# Patient Record
Sex: Female | Born: 1945 | Race: Black or African American | Hispanic: No | Marital: Married | Smoking: Former smoker
Health system: Southern US, Community
[De-identification: ages and names within clinical notes are randomized; demographics above are authoritative.]

## PROBLEM LIST (undated history)

## (undated) DIAGNOSIS — L709 Acne, unspecified: Secondary | ICD-10-CM

## (undated) DIAGNOSIS — T7840XA Allergy, unspecified, initial encounter: Secondary | ICD-10-CM

## (undated) DIAGNOSIS — M1990 Unspecified osteoarthritis, unspecified site: Secondary | ICD-10-CM

## (undated) DIAGNOSIS — Z9289 Personal history of other medical treatment: Secondary | ICD-10-CM

## (undated) DIAGNOSIS — C01 Malignant neoplasm of base of tongue: Secondary | ICD-10-CM

## (undated) DIAGNOSIS — Z9221 Personal history of antineoplastic chemotherapy: Secondary | ICD-10-CM

## (undated) DIAGNOSIS — R21 Rash and other nonspecific skin eruption: Principal | ICD-10-CM

## (undated) DIAGNOSIS — C3490 Malignant neoplasm of unspecified part of unspecified bronchus or lung: Secondary | ICD-10-CM

## (undated) DIAGNOSIS — C139 Malignant neoplasm of hypopharynx, unspecified: Secondary | ICD-10-CM

## (undated) DIAGNOSIS — J329 Chronic sinusitis, unspecified: Secondary | ICD-10-CM

## (undated) DIAGNOSIS — E785 Hyperlipidemia, unspecified: Secondary | ICD-10-CM

## (undated) DIAGNOSIS — K219 Gastro-esophageal reflux disease without esophagitis: Secondary | ICD-10-CM

## (undated) DIAGNOSIS — K1231 Oral mucositis (ulcerative) due to antineoplastic therapy: Principal | ICD-10-CM

## (undated) DIAGNOSIS — Z923 Personal history of irradiation: Secondary | ICD-10-CM

## (undated) DIAGNOSIS — M8580 Other specified disorders of bone density and structure, unspecified site: Secondary | ICD-10-CM

## (undated) DIAGNOSIS — C029 Malignant neoplasm of tongue, unspecified: Secondary | ICD-10-CM

## (undated) HISTORY — DX: Rash and other nonspecific skin eruption: R21

## (undated) HISTORY — DX: Acne, unspecified: L70.9

## (undated) HISTORY — DX: Personal history of antineoplastic chemotherapy: Z92.21

## (undated) HISTORY — DX: Malignant neoplasm of hypopharynx, unspecified: C13.9

## (undated) HISTORY — DX: Malignant neoplasm of base of tongue: C01

## (undated) HISTORY — DX: Allergy, unspecified, initial encounter: T78.40XA

## (undated) HISTORY — PX: DIAGNOSTIC LARYNGOSCOPY: SHX5368

## (undated) HISTORY — DX: Chronic sinusitis, unspecified: J32.9

## (undated) HISTORY — DX: Personal history of irradiation: Z92.3

## (undated) HISTORY — PX: REFRACTIVE SURGERY: SHX103

## (undated) HISTORY — PX: TUBAL LIGATION: SHX77

## (undated) HISTORY — DX: Oral mucositis (ulcerative) due to antineoplastic therapy: K12.31

## (undated) HISTORY — DX: Hyperlipidemia, unspecified: E78.5

## (undated) HISTORY — DX: Other specified disorders of bone density and structure, unspecified site: M85.80

---

## 1998-08-05 ENCOUNTER — Other Ambulatory Visit: Admission: RE | Admit: 1998-08-05 | Discharge: 1998-08-05 | Payer: Self-pay | Admitting: Obstetrics & Gynecology

## 1999-06-09 ENCOUNTER — Encounter: Payer: Self-pay | Admitting: *Deleted

## 1999-06-09 ENCOUNTER — Ambulatory Visit (HOSPITAL_COMMUNITY): Admission: RE | Admit: 1999-06-09 | Discharge: 1999-06-09 | Payer: Self-pay | Admitting: *Deleted

## 1999-10-16 ENCOUNTER — Other Ambulatory Visit: Admission: RE | Admit: 1999-10-16 | Discharge: 1999-10-16 | Payer: Self-pay | Admitting: Obstetrics & Gynecology

## 2001-01-09 ENCOUNTER — Other Ambulatory Visit: Admission: RE | Admit: 2001-01-09 | Discharge: 2001-01-09 | Payer: Self-pay | Admitting: Obstetrics & Gynecology

## 2001-10-13 ENCOUNTER — Ambulatory Visit (HOSPITAL_COMMUNITY): Admission: RE | Admit: 2001-10-13 | Discharge: 2001-10-13 | Payer: Self-pay | Admitting: Family Medicine

## 2001-10-13 ENCOUNTER — Encounter: Payer: Self-pay | Admitting: Family Medicine

## 2002-03-12 ENCOUNTER — Other Ambulatory Visit: Admission: RE | Admit: 2002-03-12 | Discharge: 2002-03-12 | Payer: Self-pay | Admitting: Obstetrics & Gynecology

## 2002-11-12 ENCOUNTER — Ambulatory Visit (HOSPITAL_COMMUNITY): Admission: RE | Admit: 2002-11-12 | Discharge: 2002-11-12 | Payer: Self-pay | Admitting: *Deleted

## 2002-11-12 ENCOUNTER — Encounter: Payer: Self-pay | Admitting: *Deleted

## 2003-06-10 ENCOUNTER — Other Ambulatory Visit: Admission: RE | Admit: 2003-06-10 | Discharge: 2003-06-10 | Payer: Self-pay | Admitting: Obstetrics & Gynecology

## 2004-08-05 ENCOUNTER — Other Ambulatory Visit: Admission: RE | Admit: 2004-08-05 | Discharge: 2004-08-05 | Payer: Self-pay | Admitting: Obstetrics & Gynecology

## 2005-11-17 ENCOUNTER — Other Ambulatory Visit: Admission: RE | Admit: 2005-11-17 | Discharge: 2005-11-17 | Payer: Self-pay | Admitting: Obstetrics & Gynecology

## 2009-07-11 ENCOUNTER — Encounter: Admission: RE | Admit: 2009-07-11 | Discharge: 2009-07-11 | Payer: Self-pay | Admitting: Family Medicine

## 2011-07-02 ENCOUNTER — Other Ambulatory Visit: Payer: Self-pay | Admitting: Otolaryngology

## 2011-07-02 DIAGNOSIS — R221 Localized swelling, mass and lump, neck: Secondary | ICD-10-CM

## 2011-07-05 ENCOUNTER — Ambulatory Visit
Admission: RE | Admit: 2011-07-05 | Discharge: 2011-07-05 | Disposition: A | Discharge status: Home / Self Care | Payer: BC Managed Care – PPO | Source: Ambulatory Visit | Attending: Otolaryngology | Admitting: Otolaryngology

## 2011-07-05 DIAGNOSIS — R221 Localized swelling, mass and lump, neck: Secondary | ICD-10-CM

## 2011-07-05 MED ORDER — IOHEXOL 300 MG/ML  SOLN: 75.0000 mL | Freq: Once | INTRAMUSCULAR | Status: AC | PRN

## 2011-07-21 ENCOUNTER — Other Ambulatory Visit: Payer: Self-pay | Admitting: Otolaryngology

## 2011-07-21 ENCOUNTER — Other Ambulatory Visit (HOSPITAL_COMMUNITY)
Admission: RE | Admit: 2011-07-21 | Discharge: 2011-07-21 | Disposition: A | Discharge status: Home / Self Care | Payer: BC Managed Care – PPO | Source: Ambulatory Visit | Attending: Otolaryngology | Admitting: Otolaryngology

## 2011-07-21 DIAGNOSIS — E049 Nontoxic goiter, unspecified: Secondary | ICD-10-CM | POA: Insufficient documentation

## 2011-08-12 ENCOUNTER — Other Ambulatory Visit: Payer: Self-pay | Admitting: Otolaryngology

## 2011-08-16 DIAGNOSIS — C01 Malignant neoplasm of base of tongue: Secondary | ICD-10-CM

## 2011-08-16 HISTORY — DX: Malignant neoplasm of base of tongue: C01

## 2011-08-26 ENCOUNTER — Other Ambulatory Visit (HOSPITAL_COMMUNITY): Payer: Self-pay | Admitting: Otolaryngology

## 2011-08-26 DIAGNOSIS — R221 Localized swelling, mass and lump, neck: Secondary | ICD-10-CM

## 2011-09-03 ENCOUNTER — Ambulatory Visit (HOSPITAL_COMMUNITY)
Admission: RE | Admit: 2011-09-03 | Discharge: 2011-09-03 | Disposition: A | Discharge status: Home / Self Care | Payer: BC Managed Care – PPO | Source: Ambulatory Visit | Attending: Otolaryngology | Admitting: Otolaryngology

## 2011-09-03 ENCOUNTER — Other Ambulatory Visit: Payer: Self-pay | Admitting: Interventional Radiology

## 2011-09-03 DIAGNOSIS — C760 Malignant neoplasm of head, face and neck: Secondary | ICD-10-CM | POA: Insufficient documentation

## 2011-09-03 DIAGNOSIS — R221 Localized swelling, mass and lump, neck: Secondary | ICD-10-CM

## 2011-09-03 DIAGNOSIS — C139 Malignant neoplasm of hypopharynx, unspecified: Secondary | ICD-10-CM

## 2011-09-03 HISTORY — DX: Malignant neoplasm of hypopharynx, unspecified: C13.9

## 2011-09-03 HISTORY — PX: FINE NEEDLE ASPIRATION: SHX406

## 2011-09-03 LAB — CBC
HCT: 41.4 % (ref 36.0–46.0)
Hemoglobin: 13.9 g/dL (ref 12.0–15.0)
MCH: 30.9 pg (ref 26.0–34.0)
MCHC: 33.6 g/dL (ref 30.0–36.0)
MCV: 92 fL (ref 78.0–100.0)
RBC: 4.5 MIL/uL (ref 3.87–5.11)

## 2011-09-03 LAB — PROTIME-INR: Prothrombin Time: 12.6 seconds (ref 11.6–15.2)

## 2011-09-16 ENCOUNTER — Encounter: Payer: BC Managed Care – PPO | Admitting: Oncology

## 2011-09-19 ENCOUNTER — Encounter: Payer: Self-pay | Admitting: Oncology

## 2011-09-21 ENCOUNTER — Telehealth: Payer: Self-pay | Admitting: Oncology

## 2011-09-21 ENCOUNTER — Telehealth: Payer: Self-pay

## 2011-09-21 ENCOUNTER — Other Ambulatory Visit (HOSPITAL_BASED_OUTPATIENT_CLINIC_OR_DEPARTMENT_OTHER): Payer: BC Managed Care – PPO | Admitting: Lab

## 2011-09-21 ENCOUNTER — Encounter: Payer: Self-pay | Admitting: Oncology

## 2011-09-21 ENCOUNTER — Ambulatory Visit (HOSPITAL_BASED_OUTPATIENT_CLINIC_OR_DEPARTMENT_OTHER): Payer: BC Managed Care – PPO | Admitting: Oncology

## 2011-09-21 VITALS — BP 132/85 | HR 66 | Temp 97.9°F | Ht 60.0 in | Wt 138.0 lb

## 2011-09-21 DIAGNOSIS — Z87891 Personal history of nicotine dependence: Secondary | ICD-10-CM

## 2011-09-21 DIAGNOSIS — J029 Acute pharyngitis, unspecified: Secondary | ICD-10-CM

## 2011-09-21 DIAGNOSIS — R1310 Dysphagia, unspecified: Secondary | ICD-10-CM

## 2011-09-21 DIAGNOSIS — C139 Malignant neoplasm of hypopharynx, unspecified: Secondary | ICD-10-CM

## 2011-09-21 LAB — COMPREHENSIVE METABOLIC PANEL
Albumin: 4.7 g/dL (ref 3.5–5.2)
BUN: 13 mg/dL (ref 6–23)
CO2: 26 mEq/L (ref 19–32)
Glucose, Bld: 103 mg/dL — ABNORMAL HIGH (ref 70–99)
Sodium: 142 mEq/L (ref 135–145)
Total Bilirubin: 0.3 mg/dL (ref 0.3–1.2)
Total Protein: 7.3 g/dL (ref 6.0–8.3)

## 2011-09-21 LAB — CBC WITH DIFFERENTIAL/PLATELET
Basophils Absolute: 0 10*3/uL (ref 0.0–0.1)
Eosinophils Absolute: 0.1 10*3/uL (ref 0.0–0.5)
HCT: 40.3 % (ref 34.8–46.6)
HGB: 13.5 g/dL (ref 11.6–15.9)
LYMPH%: 38 % (ref 14.0–49.7)
MONO#: 0.4 10*3/uL (ref 0.1–0.9)
NEUT#: 3.4 10*3/uL (ref 1.5–6.5)
Platelets: 261 10*3/uL (ref 145–400)
RBC: 4.34 10*6/uL (ref 3.70–5.45)
WBC: 6.2 10*3/uL (ref 3.9–10.3)

## 2011-09-21 NOTE — Telephone Encounter (Signed)
Gv pt appt for pet scan @ [facility] 11/15 @ 7 am. Per [facility] test is needed. Pt also given appt for 11/21.

## 2011-09-21 NOTE — Progress Notes (Signed)
[facility] NEW PATIENT EVALUATION   Name: Sherry Craig Date: 09/21/2011 MRN: [id number] DOB: Apr 07, 1946    CC:  [facility].     Sherry Craig, M.D.    REFERRING PHYSICIAN: Osborn Craig, M.D.   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   CHIEF COMPLAINT:  Sore throat and neck swelling.    HISTORY OF PRESENT ILLNESS:Sherry Craig is a 65 y.o. female who had history of smoking cigarettes about 1-2 packs a day until last year. She reported that she has been usual health until she developed sore throat in the right base of the tongue for about 4 months.  At first, she thought she had some bone in the back of the throat from eating pork chop. She was evaluated by Dr. Annalee Craig from ENT who did in office flexible laryngoscopy which was not diagnostic. She underwent in office fine-needle aspiration of the right cervical neck which was also negative. She underwent CT scan of the neck on 07/06/2011 which I personally reviewed myself.  There was some thickness in the right base of tongue molecular in the right cervical level II lymphadenopathy about 2-3 cm. She underwent ultrasound-guided biopsy of this right cervical neck with pathology consistent with a squamous cell carcinoma pathology case number and Sherry Craig date from 09/06/2011.  Per my discussions with Dr. Osborn Craig patient also underwent in the OR direct endoscopy which showed thickness in the right basal tongue molecular however pathology was negative on biopsy.  Patient presented to the first time today with her husband. She reports about she has stable right neck node swelling area it has not clinically increased compared to the last 2 months. She still has some catching sensation and some dysphagia in the right base of tongue when she swallows. She he reports good appetite without any weight loss. She did  not headache, visual changes, hearing loss, ear pain, node swelling anywhere else, chest pain, abdominal pain, bleeding symptoms skin rash focal motor weakness.   PAST MEDICAL HISTORY:  has a past medical history of Hyperlipemia; Allergy; Recurrent sinus infections; and Cancer of base of tongue (08/2011).     PAST SURGICAL HISTORY: Past Surgical History  Procedure Date  . Tubal ligation     CURRENT MEDICATIONS:  Current Outpatient Prescriptions  Medication Sig Dispense Refill  . atorvastatin (LIPITOR) 20 MG tablet Take 20 mg by mouth daily.        . calcium carbonate (OS-CAL) 1250 MG chewable tablet Chew 1 tablet by mouth daily.        . cholecalciferol (VITAMIN D) 1000 UNITS tablet Take 2,000 Units by mouth daily.        . fexofenadine-pseudoephedrine (ALLEGRA-D 24) 180-240 MG per 24 hr tablet Take 1 tablet by mouth as needed.        . fluticasone (FLONASE) 50 MCG/ACT nasal spray Place 2 sprays into the nose daily.        Marland Kitchen omeprazole (PRILOSEC) 20 MG capsule Take 20 mg by mouth daily.          ALLERGIES: Review of patient's allergies indicates no known allergies.   SOCIAL HISTORY:  reports that she has quit smoking. She quit smokeless tobacco use about 13 months ago. She reports that she drinks alcohol. She reports that she does not use illicit drugs.  She used to smoke 1-2 packs a day.     FAMILY HISTORY: family  history includes Cancer in her maternal aunt; Coronary artery disease in her mother; and Pulmonary embolism in her sister.   LABORATORY DATA:  Results for orders placed in visit on 09/21/11 (from the past 48 hour(s))  CBC WITH DIFFERENTIAL     Status: Normal   Collection Time   09/21/11  8:35 AM      Component Value Range Comment   WBC 6.2  3.9 - 10.3 (10e3/uL)    NEUT# 3.4  1.5 - 6.5 (10e3/uL)    HGB 13.5  11.6 - 15.9 (g/dL)    HCT 09.8  11.9 - 14.7 (%)    Platelets 261  145 - 400 (10e3/uL)    MCV 92.9  79.5 - 101.0 (fL)    MCH 31.1  25.1 - 34.0 (pg)    MCHC  33.5  31.5 - 36.0 (g/dL)    RBC 8.29  5.62 - 1.30 (10e6/uL)    RDW 13.4  11.2 - 14.5 (%)    lymph# 2.4  0.9 - 3.3 (10e3/uL)    MONO# 0.4  0.1 - 0.9 (10e3/uL)    Eosinophils Absolute 0.1  0.0 - 0.5 (10e3/uL)    Basophils Absolute 0.0  0.0 - 0.1 (10e3/uL)    NEUT% 53.7  38.4 - 76.8 (%)    LYMPH% 38.0  14.0 - 49.7 (%)    MONO% 6.4  0.0 - 14.0 (%)    EOS% 1.4  0.0 - 7.0 (%)    BASO% 0.5  0.0 - 2.0 (%)      CMET: pending.    RADIOGRAPHY:  07/06/2011 CT neck as reviewed in HPI.   PATH:  09/13/2011:    NECK, FINE NEEDLE ASPIRATION, RIGHT   MALIGNANT CELLS PRESENT.   THE MALIGNANT CELLS ARE CONSISTENT WITH SQUAMOUS CELL CARCINOMA.  REVIEW OF SYSTEMS: Pertinent items are noted in HPI.   PHYSICAL EXAM:  height is 5' (1.524 m) and weight is 138 lb (62.596 kg). Her oral temperature is 97.9 F (36.6 C). Her blood pressure is 132/85 and her pulse is 66.   ECOG 0  General:  well-nourished in no acute distress.  Eyes:  no scleral icterus.  ENT:  There were no oropharyngeal lesions on my unaided exam.  Neck was without thyromegaly.  Lymphatics:  Positive for right level 2-3 adenopathy of about 3x2cm.  Negative for, supraclavicular or axillary adenopathy.  Respiratory: lungs were clear bilaterally without wheezing or crackles.  Cardiovascular:  Regular rate and rhythm, S1/S2, without murmur, rub or gallop.  There was no pedal edema.  GI:  abdomen was soft, flat, nontender, nondistended, without organomegaly.  Muscoloskeletal:  no spinal tenderness of palpation of vertebral spine.  Skin exam was without echymosis, petichae.  Neuro exam was nonfocal.  Patient was able to get on and off exam table without assistance.  Gait was normal.  Patient was alerted and oriented.  Attention was good.   Language was appropriate.  Mood was normal without depression.  Speech was not pressured.  Thought content was not tangential.     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 I discussed with Sherry Craig and husband that this kind of cancer is normally treated with concurrent chemoradiotherapy with salvage resection if needed. Upfront resection has morbidities  because of the location of the tumor in  addition to requirement of adjuvant chemotherapy anyway. Within the past 20 years this tumor has been treated with concurrent definitive chemotherapy with response rates upper tooth 60-70% response rate.   I recommended staging complete workup with a PET scan within the next 10 days. I referred her to rad oncology for evaluation within the next 5 days in addition to dental eval with Dr. Kristin Bruins.  I referred her to speech pathology to decrease the risk of esophageal stricture. I discussed with her the options for chemotherapy which are standard of care with cis-platinum versus concurrent chemotherapy with carboplatin/Taxol versus Erbitux.  I discussed her that different chemotherapy regimens have distant side effects from her cis-platinum has been around the longest with the most randomized data to support its use. I discussed herself side effects of cisplatin which can include but not limited to fatigue, alopecia, mucositis, nausea vomiting, ototoxicity, nephrotoxicity, infusion reaction, cytopenias, infection.  I also discussed the potential use of Erbitux in patients who are elderly or with organ dysfunction who are not candidate for chemotherapy.  Present RTOG protocol looking at the role of cisplatin versus Erbitux impression H. B+ and neck cancer. She does not want to be involved in this trial because of her concern that she has smoking history and leaving her at high risk prognosis.  I will see her myself in about 2 weeks to go over the last minute questions. I referred to chemotherapy plus. I prescribed for her nausea medication to be used at  home including Ativan, Zofran, and Compazine.  I discussed with her that the high risk of smoking one on therapy or post therapy to increase her risk of recurrent cancer. She is at this time nonsmoker anymore and does not need any help to remain nonsmoker.

## 2011-09-21 NOTE — Telephone Encounter (Signed)
Received office notes [facility] ; forwarded to Dr. Gaylyn Rong.

## 2011-09-21 NOTE — Telephone Encounter (Signed)
gv pt appt for 11/21.

## 2011-09-22 ENCOUNTER — Other Ambulatory Visit: Payer: Self-pay

## 2011-09-22 MED ORDER — LORAZEPAM 0.5 MG PO TABS: 0.5000 mg | ORAL_TABLET | Freq: Four times a day (QID) | ORAL | Status: AC

## 2011-09-22 MED ORDER — LORAZEPAM 0.5 MG PO TABS: 0.5000 mg | ORAL_TABLET | Freq: Three times a day (TID) | ORAL | Status: DC

## 2011-09-29 ENCOUNTER — Ambulatory Visit: Payer: BC Managed Care – PPO | Admitting: Radiation Oncology

## 2011-09-29 ENCOUNTER — Ambulatory Visit: Payer: BC Managed Care – PPO

## 2011-09-30 ENCOUNTER — Encounter (HOSPITAL_COMMUNITY): Payer: Self-pay

## 2011-09-30 ENCOUNTER — Ambulatory Visit (HOSPITAL_COMMUNITY)
Admission: RE | Admit: 2011-09-30 | Discharge: 2011-09-30 | Disposition: A | Discharge status: Home / Self Care | Payer: BC Managed Care – PPO | Source: Ambulatory Visit | Attending: Oncology | Admitting: Oncology

## 2011-09-30 DIAGNOSIS — R599 Enlarged lymph nodes, unspecified: Secondary | ICD-10-CM | POA: Insufficient documentation

## 2011-09-30 DIAGNOSIS — C139 Malignant neoplasm of hypopharynx, unspecified: Secondary | ICD-10-CM

## 2011-09-30 DIAGNOSIS — C01 Malignant neoplasm of base of tongue: Secondary | ICD-10-CM | POA: Insufficient documentation

## 2011-09-30 LAB — GLUCOSE, CAPILLARY: Glucose-Capillary: 102 mg/dL — ABNORMAL HIGH (ref 70–99)

## 2011-09-30 MED ORDER — FLUDEOXYGLUCOSE F - 18 (FDG) INJECTION
16.4000 | Freq: Once | INTRAVENOUS | Status: AC | PRN
  Administered 2011-09-30: 16.4 via INTRAVENOUS

## 2011-10-01 ENCOUNTER — Ambulatory Visit: Payer: BC Managed Care – PPO | Attending: Oncology

## 2011-10-01 DIAGNOSIS — C029 Malignant neoplasm of tongue, unspecified: Secondary | ICD-10-CM | POA: Insufficient documentation

## 2011-10-01 DIAGNOSIS — IMO0001 Reserved for inherently not codable concepts without codable children: Secondary | ICD-10-CM | POA: Insufficient documentation

## 2011-10-02 ENCOUNTER — Other Ambulatory Visit: Payer: Self-pay | Admitting: Oncology

## 2011-10-06 ENCOUNTER — Ambulatory Visit (HOSPITAL_BASED_OUTPATIENT_CLINIC_OR_DEPARTMENT_OTHER): Payer: BC Managed Care – PPO | Admitting: Oncology

## 2011-10-06 ENCOUNTER — Telehealth: Payer: Self-pay

## 2011-10-06 ENCOUNTER — Ambulatory Visit
Admission: RE | Admit: 2011-10-06 | Discharge: 2011-10-06 | Disposition: A | Discharge status: Home / Self Care | Payer: BC Managed Care – PPO | Source: Ambulatory Visit | Attending: Radiation Oncology | Admitting: Radiation Oncology

## 2011-10-06 ENCOUNTER — Telehealth: Payer: Self-pay | Admitting: Oncology

## 2011-10-06 ENCOUNTER — Encounter: Payer: Self-pay | Admitting: Radiation Oncology

## 2011-10-06 VITALS — BP 145/88 | HR 84 | Temp 97.0°F | Ht 60.0 in | Wt 138.3 lb

## 2011-10-06 DIAGNOSIS — E785 Hyperlipidemia, unspecified: Secondary | ICD-10-CM | POA: Insufficient documentation

## 2011-10-06 DIAGNOSIS — C01 Malignant neoplasm of base of tongue: Secondary | ICD-10-CM

## 2011-10-06 DIAGNOSIS — C139 Malignant neoplasm of hypopharynx, unspecified: Secondary | ICD-10-CM

## 2011-10-06 DIAGNOSIS — Z510 Encounter for antineoplastic radiation therapy: Secondary | ICD-10-CM | POA: Insufficient documentation

## 2011-10-06 DIAGNOSIS — R220 Localized swelling, mass and lump, head: Secondary | ICD-10-CM

## 2011-10-06 DIAGNOSIS — R490 Dysphonia: Secondary | ICD-10-CM | POA: Insufficient documentation

## 2011-10-06 HISTORY — DX: Malignant neoplasm of tongue, unspecified: C02.9

## 2011-10-06 HISTORY — DX: Gastro-esophageal reflux disease without esophagitis: K21.9

## 2011-10-06 MED ORDER — LORAZEPAM 0.5 MG PO TABS: 0.5000 mg | ORAL_TABLET | Freq: Four times a day (QID) | ORAL | Status: DC | PRN

## 2011-10-06 MED ORDER — PROCHLORPERAZINE MALEATE 10 MG PO TABS: 10.0000 mg | ORAL_TABLET | Freq: Four times a day (QID) | ORAL | Status: DC | PRN

## 2011-10-06 MED ORDER — ONDANSETRON HCL 8 MG PO TABS: ORAL_TABLET | ORAL | Status: DC

## 2011-10-06 NOTE — Progress Notes (Signed)
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   CURRENT THERAPY:  To be determined.   INTERVAL HISTORY: Sherry Craig 65 y.o. female returns for regular follow up to go over the work up so far and to decide on the final plan of treatment.  She is here with her husband.  She still has hoarse voice and right neck swelling.  She still has no problem with right arm pain/paresthesia.  She denies SOB, CP, cough, hemoptysis, other bleeding sx, bone pain.    MEDICAL HISTORY: Past Medical History  Diagnosis Date  . Hyperlipemia   . Allergy   . Recurrent sinus infections   . 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 Tongue cancer   . GERD (gastroesophageal reflux disease)     SURGICAL HISTORY:  Past Surgical History  Procedure Date  . Tubal ligation    MEDICATIONS: Current Outpatient Prescriptions  Medication Sig Dispense Refill  . atorvastatin (LIPITOR) 20 MG tablet Take 20 mg by mouth daily.        . calcium carbonate (OS-CAL) 1250 MG chewable tablet Chew 1 tablet by mouth daily.        . cholecalciferol (VITAMIN D) 1000 UNITS tablet Take 2,000 Units by mouth daily.        . fexofenadine-pseudoephedrine (ALLEGRA-D 24) 180-240 MG per 24 hr tablet Take 1 tablet by mouth as needed.        Marland Kitchen omeprazole (PRILOSEC) 20 MG capsule Take 20 mg by mouth daily.        . fluticasone (FLONASE) 50 MCG/ACT nasal spray Place 2 sprays into the nose daily.        Marland Kitchen LORazepam (ATIVAN) 0.5 MG tablet Take 1 tablet (0.5 mg total) by mouth every 6 (six) hours as needed for anxiety (Anticipatory nausea or vomiting).  30 tablet  3  . ondansetron (ZOFRAN) 8 MG tablet Take 1 tablet two times a day as needed for nausea or vomiting starting on the third day after chemotherapy.  30 tablet  3  . prochlorperazine (COMPAZINE) 10 MG tablet Take 1 tablet (10 mg total) by mouth every 6 (six) hours as needed (Nausea or vomiting).  30 tablet   3    ALLERGIES:   has no known allergies.  REVIEW OF SYSTEMS:  The rest of the 14-point review of system was negative.   Filed Vitals:   10/06/11 1348  BP: 145/88  Pulse: 84  Temp: 97 F (36.1 C)   Wt Readings from Last 3 Encounters:  10/06/11 138 lb (62.596 kg)  10/06/11 138 lb 4.8 oz (62.732 kg)  09/21/11 138 lb (62.596 kg)   ECOG Performance status: 0  PHYSICAL EXAMINATION:    Limited physical exam:  General: well-nourished in no acute distress. Eyes: no scleral icterus. ENT: There were no oropharyngeal lesions on my unaided exam. Neck was without thyromegaly. Lymphatics: Positive for right level 2-3 adenopathy of about 3x2cm. Negative for, supraclavicular or axillary adenopathy.  LABORATORY/RADIOLOGY DATA:  Lab Results  Component Value Date   WBC 6.2 09/21/2011   HGB 13.5 09/21/2011   HCT 40.3 09/21/2011   PLT 261 09/21/2011   GLUCOSE 103* 09/21/2011   ALT 9 09/21/2011   AST 16 09/21/2011   NA 142 09/21/2011   K 4.7 09/21/2011   CL 105 09/21/2011   CREATININE 0.91 09/21/2011   BUN 13 09/21/2011   CO2 26 09/21/2011   INR 0.92 09/03/2011  Nm Pet Image Initial (pi) Skull Base To Thigh:  I personally reviewed the images myself and showed the pictures to the patient and her husband.   There was evidence of right hypopharynx mass/uptake and right level II node.  There was no evidence of metastatic disease.   09/30/2011  *RADIOLOGY REPORT*  Clinical Data:  Initial treatment strategy for base of tongue cancer. Squamous cell carcinoma diagnosed on fine needle aspiration of the right neck.  Pretreatment exam.  NUCLEAR MEDICINE PET CT INITIAL (PI) SKULL BASE TO THIGH  Technique:  16.4 mCi F-18 FDG was injected intravenously via the right hand IV site.  Full-ring PET imaging was performed from the skull base through the mid-thighs 62  minutes after injection.  CT data was obtained and used for attenuation correction and anatomic localization only.  (This was not acquired as a diagnostic  CT examination.)  Fasting Blood Glucose:  102  Patient Weight:  138 pounds.  Comparison:  Ultrasound biopsy 09/03/2011, neck CT 07/05/2011 at [facility] [address]  Findings: Symmetric bilateral moderate hypopharyngeal uptake, S U V maximum 6.3, likely related to motion/phonation.  Malignant range uptake noted within the previously identified right perilaryngeal mass, S U V maximum 11.5.  Soft tissue mass posterior to the right sternocleidomastoid muscle, previously biopsied, also demonstrate malignant range F D G avidity, S U V maximum 17.4.  Above measurements and findings were performed on dedicated additional images obtained through the neck.  Images obtained from the skull base through the mid thigh demonstrate additional areas of apparent low - moderate range F D G uptake in the perilaryngeal/hypo pharyngeal soft tissues, most compatible with motion as these were not present on the additional dedicated views.  Physiologic F D G uptake is noted within the myocardium, nondilated renal collecting systems, and bowel.  No additional area of abnormal F D G uptake is seen in the chest, abdomen, or pelvis.  Review CT images obtained for attenuation correction purposes redemonstrates the previously seen findings in the neck.  2 cm right mid renal cortical cyst noted.  Motion artifact versus 5 mm hyperdense lesion, possibly a cyst but not further characterized, noted in the right upper renal pole.  Lobulated contour to the uterus may suggest underlying fibroids.  IMPRESSION: Malignant range F D G uptake associated with previously identified right perilaryngeal mass and presumed conglomerate lymphadenopathy posterior to the right sternocleidomastoid muscle.  No FDG avid uptake to suggest malignancy within the chest, abdomen, or pelvis.  Original Report Authenticated By: Harrel Lemon, M.D.   ASSESSMENT AND PLAN:   I discussed with patient and her husband in extensive detailed today.  I also  discussed with Dr. Michell Heinrich after she has had a chance to see the patient.  With her examination and the PET scan, this tumor is more hypopharynx as opposed to oropharynx.  The chance of cure with chemoradiation and free from laryngectomy is about 30% compared to 60-70% cure rate with oropharynx.  Pt still preferred to proceed with concurrent definitive chemoradiation and save laryngectomy for salvage.    She had had a chance to review the 3 options for chemo:  Cisplatin vs. Carboplatin/Taxol vs. Erbitux.  She would like to go with Cisplatin.  We again went over side effects of Cisplatin which include but not limited to alopecia, fatigue, mucositis, nausea/vomiting, ototoxicity, nephrotoxicity, infusion reaction, cytopenia, risk of bleeding/infection.  She expressed informed understanding and wished to proceed.  We discussed risk of mucositis with this therapy.  She would like to go ahead and place a feeding tube by IR.  I referred her to Dr. Kristin Bruins to be seen within 1 wk.  She had already seen speech pathology.  I referred her to audiogram to get baseline audiogram before cisplatin chemo.  I referred her to chemo class.  I gave her prescription for Compazine/Zofran/and Ativan prn nausea/vomiting.  Cisplatin is tentatively due to start in about 2-3 wks depending on when rad onc is ready to start treatment.  I will see her the day before chemo to answer any last minute question.

## 2011-10-06 NOTE — Progress Notes (Signed)
Please see the Nurse Progress Note in the MD Initial Consult Encounter for this patient. 

## 2011-10-06 NOTE — Progress Notes (Signed)
Invasive squamous cell ca of right neck Fine needle biopsy right neck 09/03/11 Ct of neck w/contrast 07/05/11:supraglottic right hypopharynx density Pet 09/30/11:malignant right perilaryngeal mass

## 2011-10-06 NOTE — Telephone Encounter (Signed)
Called IR for PEG Tube, Sherry Craig will call pt next week, also called Doris @ Dr Kristin Bruins dental office, they will call pt next week.

## 2011-10-07 DIAGNOSIS — C139 Malignant neoplasm of hypopharynx, unspecified: Secondary | ICD-10-CM | POA: Diagnosis present

## 2011-10-11 NOTE — Progress Notes (Signed)
CC:   Cindra Eves, D.D.S. Exie Parody, M.D. Kinnie Scales. Annalee Genta, M.D. Dario Guardian, M.D.  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  PREVIOUS INTERVENTIONS:  FNA of right neck mass on 09/03/2011 revealing squamous cell carcinoma.  HISTORY OF PRESENT ILLNESS:  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  She was worried that she had swallowed a bone.  When this developed then to be associated with some ear pain, she presented to Dr. Annalee Genta, who performed a flexible laryngoscopy.  No abnormalities were noted.  A CT scan on 07/08/2011 showed a soft tissue mass in the right vallecula as well as a right neck mass.  Physical examination showed a 2 cm mass in the right neck and an FNA on 07/21/2011 was nondiagnostic.  An exam under anesthesia on 08/12/2011 showed no evidence of mucosal lesions in the area of the vallecula.  Biopsies were nondiagnostic.  The vocal cords were noted to be normal.  An ultrasound-guided FNA was performed on 09/03/2011 which showed squamous cell carcinoma.  A core needle biopsy was also positive for invasive squamous cell carcinoma.  She was seen back in the office by Dr. Annalee Genta on October 30th.  Referral was sent to the Cancer Center.  She met with Dr. Gaylyn Rong on 09/20/2010 who reviewed her imaging and pathology.  He ordered a PET scan on 09/30/2011 which showed bilateral hypopharyngeal uptake with an SUV of 6.3.  SUV of 11.5 was also noted in a supraglottic/hypopharyngeal mass.  A soft tissue mass posterior to the right sternocleidomastoid demonstrated SUV of 17.3.  No evidence of metastatic disease was noted.  She was then referred to me for consideration of radiation in the management of her disease.  In speaking with [name], she states that her pain has continued.  It is worse with swallowing.  She does also notice some increasing hoarseness  when she uses her voice a great deal.  She otherwise denies any weight loss.  PAST MEDICAL HISTORY: 1. Hyperlipidemia. 2. Allergies. 3. Sinus infections. 4. Status post tubal ligation.  MEDICATIONS:  Lipitor, Os-Cal, vitamin D, Allegra, fluticasone, Prilosec.  ALLERGIES:  No known drug allergies.  SOCIAL HISTORY:  She is a retired Engineer, site.  She quit smoking recently.  She smoked 1-2 packs a day.  She denies any alcohol or drug use.  She reports 1-2 alcoholic drinks per week.  FAMILY HISTORY:  Significant for cancer in a maternal aunt.  No other family history of malignancy.  REVIEW OF SYSTEMS:  As per the history of present illness.  All other systems reviewed and found to be negative.  PHYSICAL EXAMINATION:  She is a pleasant female in no distress, sitting comfortably on the exam room table.  She appears her stated age.  She has a large fixed right neck node at about the level III position.  She has no palpable left cervical or supraclavicular adenopathy. Examination of the oral cavity and oropharynx shows good dentition with no evidence of lesions in the base of tongue, oral cavity or oropharynx. Indirect mirror examination shows a normal epiglottis and aryepiglottic fold, and vallecula.  The vocal cords move normally and approximated at midline.  Heart:  Regular rate and rhythm.  Lungs:  Clear to auscultation bilaterally.  IMPRESSION:  T2 N1 hypopharyngeal cancer.  RECOMMENDATIONS:  I talked to [name] and her husband at length regarding her diagnosis and options for  treatment.  She was discussed at Multidisciplinary Tumor Board and recommendations were to proceed forward with organ preservation approach.  We discussed the role of radiation in the management of this disease.  We discussed 35 treatments as an outpatient.  We discussed the possibility of treatment failure as well as the possibility of long-term damage to the swallowing musculature  resulting in chronic aspiration.  We discussed the need for salvage laryngectomy.  We discussed the possible side effects of treatment including but not limited to skin irritation, dysphagia, odynophagia, and loss of salivary gland function resulting in damage to the teeth and the possibility of radionecrosis.  She has already seen our speech pathologist.  We discussed the possibility of feeding tube dependence.  We discussed hypothyroidism and secondary malignancies. She would like to proceed forward.  I will be in contact with Dr. Kristin Bruins and as soon as any teeth have been removed that he feels are necessary, we will continue on with simulation and treatment planning. Her 1st chemotherapy is scheduled for the 2nd week of December. Hopefully we can meet that goal.  I will schedule her again for simulation as soon as she is released to Dr. Kristin Bruins.  She has also got a feeding tube placement scheduled.    ______________________________ Lurline Hare, M.D. SW/MEDQ  D:  10/07/2011  T:  10/07/2011  Job:  [id number]

## 2011-10-12 ENCOUNTER — Encounter (HOSPITAL_COMMUNITY): Payer: Self-pay | Admitting: Pharmacy Technician

## 2011-10-12 ENCOUNTER — Encounter: Payer: BC Managed Care – PPO | Admitting: Nutrition

## 2011-10-12 ENCOUNTER — Other Ambulatory Visit: Payer: BC Managed Care – PPO

## 2011-10-12 ENCOUNTER — Encounter: Payer: Self-pay | Admitting: *Deleted

## 2011-10-12 NOTE — Assessment & Plan Note (Signed)
I met with the patient and her husband today regarding her new diagnosis of hypopharyngeal cancer for which she will receive concurrent radiation chemotherapy.  PAST MEDICAL HISTORY:  Includes hyperlipidemia, tobacco and GERD.  MEDICATIONS:  Include Lipitor, Os-Cal, vitamin D, Ativan, Zofran and Compazine.  LABS:  Include a glucose of 103.   Height is 60 inches, weight 138 pounds, usual body weight 130 to 133 pounds.  BMI is 26.9.  ESTIMATED NUTRITION NEEDS:  1850 to 2050 calories, 95-105 g of protein, 2.1 L of fluid.  The patient reports that she will have a feeding tube placed this Friday.  She is schedule to begin treatments in December.  She has just finished her chemo education class.  NUTRITION DIAGNOSES: 1. Food and nutrition related knowledge deficit related to new     diagnosis of hypopharyngeal cancer as evidenced by no prior need     for nutrition information. 2. Predicted suboptimal energy intake related to diagnosis of     hypopharyngeal cancer and associated treatments as evidenced by     history of this condition for which research shows an increased     incidence of suboptimal energy intake.  INTERVENTION:  I have educated the patient and her husband on the importance of small frequent meals with adequate calories and protein to promote weight maintenance and preservation of lean body mass.  We have discussed strategies for dealing with potential nausea and constipation. I have stressed the importance of adequate hydration and mouth care.  I briefly reviewed methods of tube feeding to include bolus and continuous feedings.  I provided fact sheets for the patient to refer to.  I have answered her questions and her husband's questions.  I have also provided them with samples of nutritional beverages that she can try to see which one she prefers.  The patient and husband are appreciative of information.  GOAL:  The patient will tolerate oral diet to minimize weight loss to  no more than 5% of usual body weight.  NEXT VISIT:  Thursday, December 13.    ______________________________ Zenovia Jarred, RD, LDN Clinical Nutrition Specialist BN/MEDQ  D:  10/12/2011  T:  10/12/2011  Job:  [id number]

## 2011-10-14 ENCOUNTER — Ambulatory Visit (HOSPITAL_COMMUNITY): Payer: Self-pay | Admitting: Dentistry

## 2011-10-14 ENCOUNTER — Encounter (HOSPITAL_COMMUNITY): Payer: Self-pay | Admitting: Dentistry

## 2011-10-14 ENCOUNTER — Other Ambulatory Visit: Payer: Self-pay | Admitting: Radiology

## 2011-10-14 ENCOUNTER — Other Ambulatory Visit (HOSPITAL_COMMUNITY): Payer: Self-pay | Admitting: *Deleted

## 2011-10-14 VITALS — BP 145/93 | HR 64 | Temp 97.1°F

## 2011-10-14 DIAGNOSIS — K08109 Complete loss of teeth, unspecified cause, unspecified class: Secondary | ICD-10-CM

## 2011-10-14 DIAGNOSIS — K036 Deposits [accretions] on teeth: Secondary | ICD-10-CM

## 2011-10-14 DIAGNOSIS — Z0189 Encounter for other specified special examinations: Secondary | ICD-10-CM

## 2011-10-14 DIAGNOSIS — K031 Abrasion of teeth: Secondary | ICD-10-CM

## 2011-10-14 DIAGNOSIS — K0530 Chronic periodontitis, unspecified: Secondary | ICD-10-CM

## 2011-10-14 DIAGNOSIS — K045 Chronic apical periodontitis: Secondary | ICD-10-CM

## 2011-10-14 DIAGNOSIS — K030 Excessive attrition of teeth: Secondary | ICD-10-CM

## 2011-10-14 MED ORDER — DEXTROSE 5 % IV SOLN: 1.0000 g | Freq: Once | INTRAVENOUS | Status: DC

## 2011-10-14 NOTE — Patient Instructions (Signed)

## 2011-10-14 NOTE — Progress Notes (Signed)
DENTAL CONSULTATION  Date:    10/14/2011 Name:   Ruqaya Strauss  Date of birh:   07-08-1946 MRN:     [id number]  VITALS: BP 145/93  Pulse 64  Temp(Src) 97.1 F (36.2 C) (Oral)  LABS: Lab Results  Component Value Date   WBC 6.2 09/21/2011   HGB 13.5 09/21/2011   HCT 40.3 09/21/2011   MCV 92.9 09/21/2011   PLT 261 09/21/2011      Component Value Date/Time   NA 142 09/21/2011 0835   K 4.7 09/21/2011 0835   CL 105 09/21/2011 0835   CO2 26 09/21/2011 0835   GLUCOSE 103* 09/21/2011 0835   BUN 13 09/21/2011 0835   CREATININE 0.91 09/21/2011 0835   CALCIUM 9.8 09/21/2011 0835    Introduction: Kashari Chalmers is a 65 year old female 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 Patient with anticipated chemoradiation therapy. Patient now seen as part of a pre-chemoradiation therapy dental protocol evaluation.   PMH: Past Medical History  Diagnosis Date  . Hyperlipemia   . Allergy   . Recurrent sinus infections   . 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 Tongue cancer   . GERD (gastroesophageal reflux disease)   . Osteopenia     ALLERGIES: No Known Allergies  MEDICATIONS: Current Outpatient Prescriptions  Medication Sig Dispense Refill  . atorvastatin (LIPITOR) 20 MG tablet Take 20 mg by mouth daily.        . calcium carbonate (OS-CAL) 1250 MG chewable tablet Chew 1 tablet by mouth daily.       . Cholecalciferol (VITAMIN D) 2000 UNITS CAPS Take 2,000 Units by mouth daily.        . fexofenadine-pseudoephedrine (ALLEGRA-D 24) 180-240 MG per 24 hr tablet Take 1 tablet by mouth daily as needed. For allergies      . fluticasone (FLONASE) 50 MCG/ACT nasal spray Place 2 sprays into the nose daily as needed. For allergies      . LORazepam (ATIVAN) 0.5 MG tablet Take 0.5 mg by mouth every 6 (six) hours as needed. For anxiety or nausea       . omeprazole (PRILOSEC) 40 MG capsule Take 40 mg by mouth daily.        . ondansetron (ZOFRAN) 8 MG tablet Take 1  tablet two times a day as needed for nausea or vomiting starting on the third day after chemotherapy.  30 tablet  3  . Polyethyl Glycol-Propyl Glycol (SYSTANE OP) Place 1 drop into both eyes 3 (three) times daily as needed. For allergies       . prochlorperazine (COMPAZINE) 10 MG tablet Take 1 tablet (10 mg total) by mouth every 6 (six) hours as needed (Nausea or vomiting).  30 tablet  3   No current facility-administered medications for this visit.   Facility-Administered Medications Ordered in Other Visits  Medication Dose Route Frequency Provider Last Rate Last Dose  . DISCONTD: ceFAZolin (ANCEF) 1 g in dextrose 5 % 50 mL IVPB  1 g Intravenous Once Robet Leu, PA        SOCIAL HISTORY: History   Social History  . Marital Status: Married    Spouse Name: N/A    Number of Children: N/A  . Years of Education: N/A   Occupational History  . Not on file.   Social History Main Topics  . Smoking status: Former Smoker -- 1.0 packs/day for 30 years    Types: Cigarettes    Quit date: 11/15/2009  . Smokeless tobacco:  Never Used  . Alcohol Use: 1.2 oz/week    2 Glasses of wine per week  . Drug Use: No  . Sexually Active: Not on file   Other Topics Concern  . Not on file   Social History Narrative  . No narrative on file    FAMILY HISTORY: Family History  Problem Relation Age of Onset  . Coronary artery disease Mother   . Heart disease Mother   . Hypertension Mother   . Heart attack Mother   . Pulmonary embolism Sister   . Cancer Maternal Aunt     FUNCTIONAL ASSESSMENT: Patient remains independent for ADLs at this time.  REVIEW OF SYSTEMS: Reviewed from chart for this patient.  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  Patient with anticipated chemoradiation therapy. Patient  is now seen as part of a pre-chemoradiation therapy dental protocol evaluation.  She currently denies acute toothache, swellings, or abscesses. Patient was last seen by her primary dentist (Dr. Assunta Found) were dental exam and cleaning in November of 2012. Patient denies having any on may dental needs at this time. Patient is usually seen on every 4-6 month basis  DENTAL EXAMINATION:  GENERAL: patient is a well-developed well-nourished female in no acute distress. HEAD AND NECK: or significant right neck lymphadenopathy measuring approximately 2-3 cm. Patient denies acute TMJ symptoms at this time.  INTRAORAL EXAM: Patient has normal saliva. I do not see any evidence of abscess formation within the mouth.  DENTITION:There are multiple missing teeth numbers 1, 3, 5, 12, 16, 18, 19, 30, and 32. Most of the missing teeth have been replaced with crown and bridge restorations appear PERIODONTAL: patient with chronic periodontitis with minimal plaque accumulations, selective areas of gingival recession and no significant tooth mobility. There is incipient bone loss noted.  DENTAL CARIES/SUBOPTIMAL RESTORATIONS:There are multiple dental restorations noted. Patient could be evaluated for restoration of multiple flexure lesions.  ENDODONTIC: Patient currently denies acute pulpitis symptoms. Patient may have incipient periapical pathology associated with the apex of tooth #20 at this time. Further evaluation by primary dentist or endodontist is indicated at this time for possible referral for root canal therapy. CROWN AND BRIDGE:Most margins of the crowns appear to be acceptable but could be evaluated for replacement in the future as indicated. All margins appear to be clinically acceptable at this time.  PROSTHODONTIC: patient has no history of partial dentures.  OCCLUSION:The occlusion appears to be stable at this time.  RADIOGRAPHIC INTERPRETATION:  (A full series of dental radiographs was obtained along  with and orthopantogram x-ray.) There are multiple missing teeth. There is incipient bone loss. There is a possible periapical radiolucency at the apex of tooth #20. There is evidence of attrition. There are multiple crown and bridge restorations noted. Most missing teeth have been restored with crown and bridge restorations.   ASSESSMENTS: 1. Chronic periodontitis  2. Minimal accretions 3. Incipient bone loss 4. Possible apical radiolucency at the apex of tooth #20-suggest evaluation by primary dentist or endodontist. 5. Multiple flexure lesions 6. Areas of mandibular interincisal attrition 7.. Multiple missing teeth 8. Most missing teeth restored with crown and bridge restorations 9. Tooth #17 in the primary field radiation therapy.  10. Need for fabrication of fluoride trays and scatter protection devices prior to anticipated radiation therapy.   PLAN/RECOMMENDATIONS: 1.  I discussed the  risks, benefits, and competitions various treatment options with the patient in relationship to her medical and dental conditions and anticipated radiation therapy and radiation therapy side effects to include xerostomia, radiation caries, trismus, mucositis, taste changes, gum and jawbone changes, and risk for infection, bleeding, and osteoradionecrosis. We discussed various treatment options to include no treatment, selective extractions of teeth and the primary field radiation therapy, pre-prosthetic surgery as indicated, alveoloplasty as indicated, periodontal therapy, dental restorations, root canal therapy, crown and bridge therapy, implant therapy, and replacement missing teeth is indicated after adequate healing. We also discussed fabrication of fluoride trays and scatter protection devices. We also discussed referral to an oral surgeon or endodontist for second opinions as indicated. Patient currently is interested in proceeding without dental extractions at this time. Patient does wish to followup with  her primary dentist for dental restorations as indicated along with evaluation of tooth #20 for a root canal therapy. Patient may consider referral to an endodontist for root canal therapy if Dr. Stoney Bang so indicates. Patient will then return to dental medicine for insertion of the fluoride trays and scatter protection devices. Patient will then be coordinated with the radiation oncology to have her simulation appointment.  2.  Discussion of findings with the radiation oncologist, medical oncologist, primary dentist and other dental and medical team members as indicated to assist in coordination of treatment for this patient.     Dr. Cindra Eves

## 2011-10-15 ENCOUNTER — Inpatient Hospital Stay (HOSPITAL_COMMUNITY): Admission: RE | Admit: 2011-10-15 | Payer: BC Managed Care – PPO | Source: Ambulatory Visit

## 2011-10-15 ENCOUNTER — Ambulatory Visit (HOSPITAL_COMMUNITY)
Admission: RE | Admit: 2011-10-15 | Discharge: 2011-10-15 | Disposition: A | Discharge status: Home / Self Care | Payer: BC Managed Care – PPO | Source: Ambulatory Visit | Attending: Interventional Radiology | Admitting: Interventional Radiology

## 2011-10-15 ENCOUNTER — Ambulatory Visit (HOSPITAL_COMMUNITY)
Admission: RE | Admit: 2011-10-15 | Discharge: 2011-10-15 | Disposition: A | Discharge status: Home / Self Care | Payer: BC Managed Care – PPO | Source: Ambulatory Visit | Attending: Oncology | Admitting: Oncology

## 2011-10-15 DIAGNOSIS — E785 Hyperlipidemia, unspecified: Secondary | ICD-10-CM | POA: Insufficient documentation

## 2011-10-15 DIAGNOSIS — Z79899 Other long term (current) drug therapy: Secondary | ICD-10-CM | POA: Insufficient documentation

## 2011-10-15 DIAGNOSIS — K219 Gastro-esophageal reflux disease without esophagitis: Secondary | ICD-10-CM | POA: Insufficient documentation

## 2011-10-15 DIAGNOSIS — M899 Disorder of bone, unspecified: Secondary | ICD-10-CM | POA: Insufficient documentation

## 2011-10-15 DIAGNOSIS — C01 Malignant neoplasm of base of tongue: Secondary | ICD-10-CM

## 2011-10-15 LAB — CBC
Hemoglobin: 13 g/dL (ref 12.0–15.0)
MCH: 30 pg (ref 26.0–34.0)
MCHC: 32.2 g/dL (ref 30.0–36.0)
MCV: 93.1 fL (ref 78.0–100.0)

## 2011-10-15 LAB — PROTIME-INR: Prothrombin Time: 13 seconds (ref 11.6–15.2)

## 2011-10-15 MED ORDER — FENTANYL CITRATE 0.05 MG/ML IJ SOLN
INTRAMUSCULAR | Status: AC | PRN
  Administered 2011-10-15: 100 ug via INTRAVENOUS

## 2011-10-15 MED ORDER — IOHEXOL 300 MG/ML  SOLN
20.0000 mL | Freq: Once | INTRAMUSCULAR | Status: AC | PRN
  Administered 2011-10-15: 20 mL

## 2011-10-15 MED ORDER — MIDAZOLAM HCL 5 MG/5ML IJ SOLN
INTRAMUSCULAR | Status: AC | PRN
  Administered 2011-10-15 (×2): 2 mg via INTRAVENOUS

## 2011-10-15 MED ORDER — LIDOCAINE-EPINEPHRINE 2 %-1:100000 IJ SOLN
INTRAMUSCULAR | Status: AC
  Filled 2011-10-15: qty 1

## 2011-10-15 MED ORDER — CEFAZOLIN SODIUM 1-5 GM-% IV SOLN
1.0000 g | Freq: Once | INTRAVENOUS | Status: AC
  Administered 2011-10-15: 1 g via INTRAVENOUS
  Filled 2011-10-15: qty 50

## 2011-10-15 MED ORDER — OXYCODONE-ACETAMINOPHEN 5-325 MG PO TABS
2.0000 | ORAL_TABLET | Freq: Once | ORAL | Status: AC
  Administered 2011-10-15: 2 via ORAL

## 2011-10-15 MED ORDER — SODIUM CHLORIDE 0.9 % IV SOLN: Freq: Once | INTRAVENOUS | Status: DC

## 2011-10-15 MED ORDER — OXYCODONE-ACETAMINOPHEN 5-325 MG PO TABS
ORAL_TABLET | ORAL | Status: AC
  Administered 2011-10-15: 2 via ORAL
  Filled 2011-10-15: qty 2

## 2011-10-15 MED ORDER — ONDANSETRON HCL 4 MG/2ML IJ SOLN
INTRAMUSCULAR | Status: AC
  Administered 2011-10-15: 4 mg via INTRAVENOUS
  Filled 2011-10-15: qty 2

## 2011-10-15 MED ORDER — SODIUM CHLORIDE 0.9 % IV SOLN
INTRAVENOUS | Status: DC
  Administered 2011-10-15: 250 mL via INTRAVENOUS

## 2011-10-15 MED ORDER — ONDANSETRON HCL 4 MG/2ML IJ SOLN
4.0000 mg | Freq: Once | INTRAMUSCULAR | Status: AC
  Administered 2011-10-15: 4 mg via INTRAVENOUS

## 2011-10-15 NOTE — Procedures (Signed)
Successful fluoroscopic guided insertion of gastrostomy tube without immediate post procedural complicatoin.   The gastrostomy tube may be used immediately for medications.  Otherwise, place gastrostomy tube to low wall suction for 24 hrs.  Tube feeds may be initiated in 24 hours as per the primary team.   

## 2011-10-15 NOTE — H&P (Signed)
Sherry Craig is an 65 y.o. female.   Chief Complaint: Here for gastrostomy tube placement HPI: Pleasant 65 yo female with a history of cancer of the tongue referred for placement of a gastrostomy tube prior to undergoing radiation therapy.  Past Medical History  Diagnosis Date  . Hyperlipemia   . Allergy   . Recurrent sinus infections   . 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 Tongue cancer   . GERD (gastroesophageal reflux disease)   . Osteopenia     Past Surgical History  Procedure Date  . Tubal ligation     Family History  Problem Relation Age of Onset  . Coronary artery disease Mother   . Heart disease Mother   . Hypertension Mother   . Heart attack Mother   . Pulmonary embolism Sister   . Cancer Maternal Aunt    Social History:  reports that she quit smoking about 22 months ago. Her smoking use included Cigarettes. She has a 30 pack-year smoking history. She has never used smokeless tobacco. She reports that she drinks about 1.2 ounces of alcohol per week. She reports that she does not use illicit drugs.  Allergies: No Known Allergies  Medications Prior to Admission  Medication Sig Dispense Refill  . atorvastatin (LIPITOR) 20 MG tablet Take 20 mg by mouth daily.        . calcium carbonate (OS-CAL) 1250 MG chewable tablet Chew 1 tablet by mouth daily.       . Cholecalciferol (VITAMIN D) 2000 UNITS CAPS Take 2,000 Units by mouth daily.        . fexofenadine-pseudoephedrine (ALLEGRA-D 24) 180-240 MG per 24 hr tablet Take 1 tablet by mouth daily as needed. For allergies      . fluticasone (FLONASE) 50 MCG/ACT nasal spray Place 2 sprays into the nose daily as needed. For allergies      . LORazepam (ATIVAN) 0.5 MG tablet Take 0.5 mg by mouth every 6 (six) hours as needed. For anxiety or nausea       . omeprazole (PRILOSEC) 40 MG capsule Take 40 mg by mouth daily.        . ondansetron (ZOFRAN) 8 MG tablet Take 1 tablet two times a day as needed for nausea or vomiting  starting on the third day after chemotherapy.  30 tablet  3  . Polyethyl Glycol-Propyl Glycol (SYSTANE OP) Place 1 drop into both eyes 3 (three) times daily as needed. For allergies       . prochlorperazine (COMPAZINE) 10 MG tablet Take 1 tablet (10 mg total) by mouth every 6 (six) hours as needed (Nausea or vomiting).  30 tablet  3   Medications Prior to Admission  Medication Dose Route Frequency Provider Last Rate Last Dose  . 0.9 %  sodium chloride infusion   Intravenous Once Robet Leu, PA      . ceFAZolin (ANCEF) IVPB 1 g/50 mL premix  1 g Intravenous Once Simonne Come      . DISCONTD: ceFAZolin (ANCEF) 1 g in dextrose 5 % 50 mL IVPB  1 g Intravenous Once Robet Leu, PA        Results for orders placed during the hospital encounter of 10/15/11 (from the past 48 hour(s))  CBC     Status: Normal   Collection Time   10/15/11 11:00 AM      Component Value Range Comment   WBC 6.5  4.0 - 10.5 (K/uL)    RBC 4.34  3.87 -  5.11 (MIL/uL)    Hemoglobin 13.0  12.0 - 15.0 (g/dL)    HCT 82.9  56.2 - 13.0 (%)    MCV 93.1  78.0 - 100.0 (fL)    MCH 30.0  26.0 - 34.0 (pg)    MCHC 32.2  30.0 - 36.0 (g/dL)    RDW 86.5  78.4 - 69.6 (%)    Platelets 267  150 - 400 (K/uL)    No results found.  Review of Systems  Constitutional: Negative for fever, chills and weight loss.  Respiratory: Negative for shortness of breath.   Cardiovascular: Negative for chest pain.    There were no vitals taken for this visit. Physical Exam  Pleasant 65 yo female in no acute distress. Heent - unremarkable Heart - RRR Lungs - Clear Abd - soft NT  Assessment/Plan Gastrostomy tube placement today. Discussed procedure with patient and husband along with risks and benefits. Questions answered. Informed consent obtained.  Takahiro Godinho 10/15/2011, 11:20 AM

## 2011-10-18 ENCOUNTER — Ambulatory Visit (HOSPITAL_COMMUNITY): Payer: Medicaid - Dental | Admitting: Dentistry

## 2011-10-18 ENCOUNTER — Other Ambulatory Visit: Payer: Self-pay | Admitting: *Deleted

## 2011-10-18 ENCOUNTER — Other Ambulatory Visit: Payer: Self-pay | Admitting: Oncology

## 2011-10-18 VITALS — BP 135/73 | HR 95 | Temp 97.5°F

## 2011-10-18 DIAGNOSIS — Z463 Encounter for fitting and adjustment of dental prosthetic device: Secondary | ICD-10-CM

## 2011-10-18 DIAGNOSIS — Z0189 Encounter for other specified special examinations: Secondary | ICD-10-CM

## 2011-10-18 DIAGNOSIS — R52 Pain, unspecified: Secondary | ICD-10-CM

## 2011-10-18 DIAGNOSIS — C01 Malignant neoplasm of base of tongue: Secondary | ICD-10-CM

## 2011-10-18 MED ORDER — HYDROCODONE-ACETAMINOPHEN 5-325 MG PO TABS: 1.0000 | ORAL_TABLET | Freq: Four times a day (QID) | ORAL | Status: AC | PRN

## 2011-10-18 NOTE — Progress Notes (Signed)
Pt states has not heard from home care nurse regarding PEG teaching.  Informed pt I will call [facility] and to expect a call from them.  She verbalized understanding.  Called Norberta Keens, RN home care coordinator w/ [facility] [phone]) and gave pt information for home care teaching.

## 2011-10-18 NOTE — Telephone Encounter (Signed)
Pt called to report some pain at new PEG tube site.  Peg was placed this past Friday.   States had some oxycodone left over from a procedure in Sept. But only 2 pills left.   New Rx for hydrocodone by Dr. Gaylyn Rong and left w/ injection nurse for pt to pick up.   Pt verbalized understanding.

## 2011-10-18 NOTE — Progress Notes (Signed)
Monday, October 18, 2011   BP: 135/73           P: 95           T: 97.5  Sherry Craig presents for insertion of upper and lower fluoride trays and scatter protection devices. Patient indicates that she does not have an appointment for stimulation at this time. Patient is to call Dr. Michell Heinrich for an appointment. Patient has an appointment with Dr. Assunta Found (dentist) having dental restoration as well as evaluation for root canal therapy of the lower left premolar.  Patient most likely will be referred to an endodontist for the root canal therapy if indicated.  Procedure: Appliances tried in and were adjusted as needed. [place]. Trismus device fabricated as well with MIO of 45 mm and use of 27 sticks. Postop instructions were provided in a written and verbal format on the use and care of appliances.  All questions answered. RTC for periodic oral exam during radiation therapy in approximately 3 weeks.  Patient to call if questions or problems before then. Dr. Kristin Bruins

## 2011-10-18 NOTE — Patient Instructions (Signed)
FLUORIDE TRAYS PATIENT INSTRUCTIONS    Obtain prescription from the pharmacy.  Don't be surprised if it needs to be ordered.   Be sure to let the pharmacy know when you are close to needing a new refill for them to have it ready for you without interruption of Fluoride use.   The best time to use your Fluoride is before bed time.   You must brush your teeth very well and floss before using the Fluoride in order to get the best use out of the Fluoride treatments.   Place 1 drop of Fluoride gel per tooth in the tray.   Place the tray on your lower teeth and/or your upper teeth.  Make sure the trays are seated all the way.  Remember, they only fit one way on your teeth.   Insert for 5 full minutes.   At the end of the 5 minutes, take the trays out.  SPIT OUT excess. .    Do NOT rinse your mouth!    Do NOT eat or drink after treatments for at least 30 minutes.  This is why the best time for your treatments is before bedtime.    Clean the inside of your Fluoride trays using COLD WATER and a toothbrush.    In order to keep your Trays from discoloring and free from odors, soak them overnight in denture cleaners such as Efferdent.  Do not use bleach or non denture products.    Store the trays in a safe dry place AWAY from any heat until your next treatment.    Bring the trays with you for your next dental check-up.  The dentist will confirm their fit.    If anything happens to your Fluoride trays, or they don't fit as well after any dental work, please let us know as soon as possible.  TRISMUS  Trismus is a condition where the jaw does not allow the mouth to open as wide as it usually does.  This can happen almost suddenly, or in other cases the process is so slow, it is hard to notice it-until it is too far along.  When the jaw joints and/or muscles have been exposed to radiation treatments, the onset of Trismus is very slow.  This is because the muscles are losing  their stretching ability over a long period of time, as long as 2 YEARS after the end of radiation.  It is therefore important to exercise these muscles and joints.  TRISMUS EXERCISES   Stack of tongue depressors measuring the same or a little less than the last documented MIO (Maximum Interincisal Opening).  Secure them with a rubber band on both ends.  Place the stack in the patient's mouth, supporting the other end.  Allow 30 seconds for muscle stretching.  Rest for a few seconds.  Repeat 3-5 times  For all radiation patients, this exercise is recommended in the mornings and evenings unless otherwise instructed.  The exercise should be done for a period of 2 YEARS after the end of radiation.  MIO should be checked routinely on recall dental visits by the general dentist or the hospital dentist.  The patient is advised to report any changes, soreness, or difficulties encountered when doing the exercises. 

## 2011-10-19 ENCOUNTER — Telehealth: Payer: Self-pay | Admitting: *Deleted

## 2011-10-19 NOTE — Telephone Encounter (Signed)
Pt called to report has still not heard from [facility] regarding PEG tube teaching at home.   Called Norberta Keens at [facility] and she says she will call office to f/u.  She says order was placed yesterday for visit today.  Called pt back and informed her that I s/w [facility] RN again and I apologized and informed they should be calling her soon. Instructed her to call back if she doesn't hear anything in the next few hours.  She verbalized understanding.

## 2011-10-20 ENCOUNTER — Telehealth: Payer: Self-pay | Admitting: *Deleted

## 2011-10-21 ENCOUNTER — Ambulatory Visit
Admission: RE | Admit: 2011-10-21 | Discharge: 2011-10-21 | Disposition: A | Discharge status: Home / Self Care | Payer: BC Managed Care – PPO | Source: Ambulatory Visit | Attending: Radiation Oncology | Admitting: Radiation Oncology

## 2011-10-21 DIAGNOSIS — C139 Malignant neoplasm of hypopharynx, unspecified: Secondary | ICD-10-CM

## 2011-10-21 NOTE — Progress Notes (Signed)
Met with patient to discuss RO billing. Patient was given an EPP for addl asst.  Dx: 141.0 Base of tongue, NOS Rad Tx: IMRT 77418 x 35; [id number]; [id number]   Attending Rad: Dr. Mackie Pai CERT APPROVED Ref Nbr: [id number] Valid: 10/21/2011 - 01/19/2012

## 2011-10-22 NOTE — Progress Notes (Signed)
[facility] Radiation Oncology Simulation and Treatment Planning Note   Name: Sherry Craig MRN: [id number]  Date: 10/22/2011  DOB: 1946-04-13  Status:outpatient    DIAGNOSIS: Hypopharyngeal Cancer    SIDE: right   CONSENT VERIFIED:yes   SET UP: Patient is set-up supine   IMMOBILIZATION: The following immobilization is used:Aquaplast Mask   NARRATIVE:The patient was brought to the CT Simulation planning suite.  Identity was confirmed.  All relevant records and images related to the planned course of therapy were reviewed.  Then, the patient was positioned in a stable reproducible clinical set-up for radiation therapy using an aquaplast mask.  IV contrast was administered and CT images were obtained.  Skin markings were placed.  The CT images were loaded into the planning software where the target and avoidance structures were contoured.  The patient's previous PET scan was fused with the planning CT to aid in target delineation. The radiation prescription was entered and confirmed.   TREATMENT PLANNING NOTE:  Treatment planning then occurred. I have requested : Intensity Modulated Radiotherapy (IMRT) is medically necessary for this case for the following reason:  Dose homogeneity.   Special treatment procedure will be performed as the patient will be receiving concurrent chemotherapy.  She will be carefully monitored for increased toxicities of treatment including weekly labs and nutrition follow up.

## 2011-10-26 ENCOUNTER — Other Ambulatory Visit: Payer: Self-pay | Admitting: Certified Registered Nurse Anesthetist

## 2011-10-27 ENCOUNTER — Ambulatory Visit (HOSPITAL_BASED_OUTPATIENT_CLINIC_OR_DEPARTMENT_OTHER): Payer: BC Managed Care – PPO | Admitting: Oncology

## 2011-10-27 ENCOUNTER — Telehealth: Payer: Self-pay | Admitting: Oncology

## 2011-10-27 ENCOUNTER — Ambulatory Visit: Payer: BC Managed Care – PPO | Attending: Oncology

## 2011-10-27 ENCOUNTER — Other Ambulatory Visit (HOSPITAL_BASED_OUTPATIENT_CLINIC_OR_DEPARTMENT_OTHER): Payer: BC Managed Care – PPO | Admitting: Lab

## 2011-10-27 VITALS — BP 129/82 | HR 71 | Temp 97.7°F | Ht 60.0 in | Wt 135.9 lb

## 2011-10-27 DIAGNOSIS — C01 Malignant neoplasm of base of tongue: Secondary | ICD-10-CM

## 2011-10-27 DIAGNOSIS — IMO0001 Reserved for inherently not codable concepts without codable children: Secondary | ICD-10-CM | POA: Insufficient documentation

## 2011-10-27 DIAGNOSIS — C139 Malignant neoplasm of hypopharynx, unspecified: Secondary | ICD-10-CM

## 2011-10-27 DIAGNOSIS — C029 Malignant neoplasm of tongue, unspecified: Secondary | ICD-10-CM | POA: Insufficient documentation

## 2011-10-27 DIAGNOSIS — R599 Enlarged lymph nodes, unspecified: Secondary | ICD-10-CM

## 2011-10-27 LAB — CBC WITH DIFFERENTIAL/PLATELET
BASO%: 0.8 % (ref 0.0–2.0)
HCT: 38.7 % (ref 34.8–46.6)
HGB: 12.9 g/dL (ref 11.6–15.9)
LYMPH%: 31.6 % (ref 14.0–49.7)
MONO#: 0.5 10*3/uL (ref 0.1–0.9)
MONO%: 7.8 % (ref 0.0–14.0)
NEUT#: 3.6 10*3/uL (ref 1.5–6.5)
RDW: 13.3 % (ref 11.2–14.5)
WBC: 6.3 10*3/uL (ref 3.9–10.3)
lymph#: 2 10*3/uL (ref 0.9–3.3)

## 2011-10-27 LAB — COMPREHENSIVE METABOLIC PANEL
AST: 14 U/L (ref 0–37)
Albumin: 4.1 g/dL (ref 3.5–5.2)
BUN: 12 mg/dL (ref 6–23)
CO2: 27 mEq/L (ref 19–32)
Calcium: 9.8 mg/dL (ref 8.4–10.5)
Chloride: 104 mEq/L (ref 96–112)
Creatinine, Ser: 0.82 mg/dL (ref 0.50–1.10)
Glucose, Bld: 85 mg/dL (ref 70–99)
Potassium: 4.5 mEq/L (ref 3.5–5.3)

## 2011-10-27 NOTE — Telephone Encounter (Signed)
Pt aware of appt for December and January, she will get calendar tomorrow

## 2011-10-27 NOTE — Progress Notes (Signed)
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   CURRENT THERAPY:  Due to start concurrent q3wk Cisplatin and daily XRT on 10/28/2011.    INTERVAL HISTORY: Sherry Craig 65 y.o. female returns to discuss any last minute issue before starting chemo tomorrow.  She still has hoarse voice.  She has enlarging right neck node with a "boil" forming in right cervical node level IV without skin break down or fever or pain.  She had PEG tube placement with some discomfort last week which has completely resolved with Vicodin.  Patient denies fatigue, headache, visual changes, confusion, drenching night sweats, palpable lymph node swelling, mucositis, odynophagia, dysphagia, nausea vomiting, jaundice, chest pain, palpitation, shortness of breath, dyspnea on exertion, productive cough, gum bleeding, epistaxis, hematemesis, hemoptysis, abdominal pain, abdominal swelling, early satiety, melena, hematochezia, hematuria, skin rash, spontaneous bleeding, joint swelling, joint pain, heat or cold intolerance, bowel bladder incontinence, back pain, focal motor weakness, paresthesia, depression, suicidal or homocidal ideation, feeling hopelessness.   MEDICAL HISTORY: Past Medical History  Diagnosis Date  . Hyperlipemia   . Allergy   . Recurrent sinus infections   . 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 Tongue cancer   . GERD (gastroesophageal reflux disease)   . Osteopenia     SURGICAL HISTORY:  Past Surgical History  Procedure Date  . Tubal ligation    MEDICATIONS: Current Outpatient Prescriptions  Medication Sig Dispense Refill  . amoxicillin (AMOXIL) 500 MG capsule Take 500 mg by mouth every 6 (six) hours.       Marland Kitchen atorvastatin (LIPITOR) 20 MG tablet Take 20 mg by mouth daily.        . calcium carbonate (OS-CAL) 1250 MG chewable tablet Chew 1 tablet by mouth daily.       . Cholecalciferol (VITAMIN D) 2000 UNITS  CAPS Take 2,000 Units by mouth daily.        . fexofenadine-pseudoephedrine (ALLEGRA-D 24) 180-240 MG per 24 hr tablet Take 1 tablet by mouth daily as needed. For allergies      . fluticasone (FLONASE) 50 MCG/ACT nasal spray Place 2 sprays into the nose daily as needed. For allergies      . HYDROcodone-acetaminophen (NORCO) 5-325 MG per tablet Take 1 tablet by mouth every 6 (six) hours as needed for pain.  90 tablet  0  . ibuprofen (ADVIL,MOTRIN) 800 MG tablet       . LORazepam (ATIVAN) 0.5 MG tablet Take 0.5 mg by mouth every 6 (six) hours as needed. For anxiety or nausea       . omeprazole (PRILOSEC) 40 MG capsule Take 40 mg by mouth daily.        . ondansetron (ZOFRAN) 8 MG tablet Take 1 tablet two times a day as needed for nausea or vomiting starting on the third day after chemotherapy.  30 tablet  3  . Polyethyl Glycol-Propyl Glycol (SYSTANE OP) Place 1 drop into both eyes 3 (three) times daily as needed. For allergies       . prochlorperazine (COMPAZINE) 10 MG tablet Take 1 tablet (10 mg total) by mouth every 6 (six) hours as needed (Nausea or vomiting).  30 tablet  3    ALLERGIES:   has no known allergies.  REVIEW OF SYSTEMS:  The rest of the 14-point review of system was negative.   Filed Vitals:   10/27/11 0832  BP: 129/82  Pulse: 71  Temp: 97.7 F (36.5 C)  Wt Readings from Last 3 Encounters:  10/27/11 135 lb 14.4 oz (61.644 kg)  10/06/11 138 lb (62.596 kg)  10/06/11 138 lb 4.8 oz (62.732 kg)   ECOG Performance status: 0  PHYSICAL EXAMINATION:   Gen: Well-nourished, in no acute distress. Eyes: No scleral icterus or jaundice. ENT: There was no oropharyngeal lesions on my unaided exam. Neck was supple without thyromegaly. Lymphatics:  Positive for a 4x2cm right level IV node.  There was no supraclavicular, axillary, or inguinal adenopathy.  Respiratory: Lungs were clear the left without wheezing or crackles. Cardiovascular: Heart rate and rhythm; S1/S2; without murmur, rubs,  or gallop. There was no pedal edema. GI: Abdomen was soft, flat, nontender, nondistended, without organomegaly. PEG tube in place without erythema/discharge/pain.  Musculoskeletal exam: No spinal tenderness on palpation of vertebral spine. Skin exam was without ecchymosis, petechiae. Neuro exam was nonfocal. Patient was able to get on and off exam table without assistance. Gait was normal. Patient was alert and oriented. Attention was good. Language was appropriate. Mood was normal without depression. Speech was not pressured. Thought content was not tangential.      LABORATORY/RADIOLOGY DATA:  Lab Results  Component Value Date   WBC 6.3 10/27/2011   HGB 12.9 10/27/2011   HCT 38.7 10/27/2011   PLT 357 10/27/2011   GLUCOSE 103* 09/21/2011   ALT 9 09/21/2011   AST 16 09/21/2011   NA 142 09/21/2011   K 4.7 09/21/2011   CL 105 09/21/2011   CREATININE 0.91 09/21/2011   BUN 13 09/21/2011   CO2 26 09/21/2011   INR 0.96 10/15/2011     ASSESSMENT AND PLAN:   1.  Hypopharyngeal SCC:  I again discussed with patient and her husband in extensive detailed today.  She and her husband again doe not want upfront laryngectomy.  They would like to attempt definitive concurrent chemoradiation.  They are aware of the 30% rate of response compared to 60-70% cure rate with oropharynx.  I discussed with her side effects of cisplatin which include but not limited to nausea vomiting, alopecia, fatigue, mucositis, ototoxicity, nephrotoxicity, abnormal electrolytes, cytopenia, risk of bleeding and infection.  Her baseline audiogram showed mild decreased hearing loss with only high frequency.  I advised her to hydrate herself especially a few days before chemo.   2.  Mucositis prevention:  I advised her to use salt/baking soda alternating with Biotene and fluoride.    3.  Nausea/vomiting prophylaxis:  I educated her and her husband on Compazine/Zofran/Ativan prn.  4.  Hyperlipidemia:  She's on Lipitor per PCP.  5.   F/U:  Chemo cisplatin 09/28/11.  She has lab weekly.  She has 3 hr tentative for IVF 3 days next week and the week after in case she develops severe dehydration from chemo.  I'll see her on 11/11/11 for interval visit.

## 2011-10-27 NOTE — Telephone Encounter (Signed)
OK to overbook on 12/27.  2/2 was a mistake; I meant 1/2.  Thanks.

## 2011-10-28 ENCOUNTER — Ambulatory Visit: Payer: BC Managed Care – PPO | Admitting: Nutrition

## 2011-10-28 ENCOUNTER — Ambulatory Visit
Admission: RE | Admit: 2011-10-28 | Discharge: 2011-10-28 | Disposition: A | Discharge status: Home / Self Care | Payer: BC Managed Care – PPO | Source: Ambulatory Visit | Attending: Radiation Oncology | Admitting: Radiation Oncology

## 2011-10-28 ENCOUNTER — Ambulatory Visit (HOSPITAL_BASED_OUTPATIENT_CLINIC_OR_DEPARTMENT_OTHER): Payer: BC Managed Care – PPO

## 2011-10-28 VITALS — BP 122/73 | HR 77 | Temp 97.8°F

## 2011-10-28 DIAGNOSIS — C139 Malignant neoplasm of hypopharynx, unspecified: Secondary | ICD-10-CM

## 2011-10-28 DIAGNOSIS — C01 Malignant neoplasm of base of tongue: Secondary | ICD-10-CM

## 2011-10-28 DIAGNOSIS — Z5111 Encounter for antineoplastic chemotherapy: Secondary | ICD-10-CM

## 2011-10-28 MED ORDER — SODIUM CHLORIDE 0.9 % IV SOLN
100.0000 mg/m2 | Freq: Once | INTRAVENOUS | Status: AC
  Administered 2011-10-28: 163 mg via INTRAVENOUS
  Filled 2011-10-28: qty 163

## 2011-10-28 MED ORDER — PALONOSETRON HCL INJECTION 0.25 MG/5ML
0.2500 mg | Freq: Once | INTRAVENOUS | Status: AC
  Administered 2011-10-28: 0.25 mg via INTRAVENOUS

## 2011-10-28 MED ORDER — SODIUM CHLORIDE 0.9 % IV SOLN
150.0000 mg | Freq: Once | INTRAVENOUS | Status: AC
  Administered 2011-10-28: 150 mg via INTRAVENOUS
  Filled 2011-10-28: qty 5

## 2011-10-28 MED ORDER — DEXAMETHASONE SODIUM PHOSPHATE 4 MG/ML IJ SOLN
12.0000 mg | Freq: Once | INTRAMUSCULAR | Status: AC
  Administered 2011-10-28: 12 mg via INTRAVENOUS

## 2011-10-28 MED ORDER — POTASSIUM CHLORIDE 2 MEQ/ML IV SOLN
Freq: Once | INTRAVENOUS | Status: AC
  Administered 2011-10-28: 10:00:00 via INTRAVENOUS
  Filled 2011-10-28: qty 10

## 2011-10-28 NOTE — Progress Notes (Signed)
The patient reports that she is still a bit sore after her PEG feeding tube placement.  Her weight has decreased to 135 pounds from 138 pounds.  She attributes this to changing her diet and eating healthier foods and less high fat, high sugar foods.  She continues to exercise, walking up to 6 miles a day.  She denies nutritional side effects at this time.  She is flushing her feeding tube with water on a daily basis and she is verbalizing the importance of adequate hydration.  NUTRITION DIAGNOSIS:  Food and nutrition-related knowledge deficit has improved.  Diagnosis of predicted suboptimal energy intake continues.  INTERVENTION:  I stressed the importance of the patient continuing to eat high-protein, high-calorie foods throughout the day to promote weight maintenance.  We discussed ways to increase her fluid intake.   I have encouraged her to continue to flush her tube with water and suggested that she might add some additional water through her tube if desired.  I have answered questions on healthy eating for her today.  I have encouraged her to continue [employer] and to increase to b.i.d. to support weight maintenance.  MONITORING/EVALUATION (GOALS):  The patient has tolerated her oral diet to minimize weight loss to no more than 5% of her usual body weight.   NEXT VISIT:  Scheduled on Thursday, January 3rd during her chemotherapy appointment.    ______________________________ Zenovia Jarred, RD, LDN Clinical Nutrition Specialist BN/MEDQ  D:  10/28/2011  T:  10/28/2011  Job:  [id number]

## 2011-10-29 ENCOUNTER — Ambulatory Visit
Admission: RE | Admit: 2011-10-29 | Discharge: 2011-10-29 | Disposition: A | Discharge status: Home / Self Care | Payer: BC Managed Care – PPO | Source: Ambulatory Visit | Attending: Radiation Oncology | Admitting: Radiation Oncology

## 2011-10-29 ENCOUNTER — Ambulatory Visit: Payer: BC Managed Care – PPO

## 2011-11-01 ENCOUNTER — Telehealth: Payer: Self-pay | Admitting: *Deleted

## 2011-11-01 ENCOUNTER — Ambulatory Visit
Admission: RE | Admit: 2011-11-01 | Discharge: 2011-11-01 | Disposition: A | Discharge status: Home / Self Care | Payer: BC Managed Care – PPO | Source: Ambulatory Visit | Attending: Radiation Oncology | Admitting: Radiation Oncology

## 2011-11-01 NOTE — Telephone Encounter (Signed)
Message on voicemail from [place], [facility] RN stating that pt missed last appt and would like to know if one more visit should be extended to the pt. Notified MD who states one more visit should be extended. Returned call to [facility] at [phone] to give above information. Verbalized understanding.

## 2011-11-02 ENCOUNTER — Other Ambulatory Visit: Payer: Self-pay | Admitting: Certified Registered Nurse Anesthetist

## 2011-11-02 ENCOUNTER — Ambulatory Visit
Admission: RE | Admit: 2011-11-02 | Discharge: 2011-11-02 | Disposition: A | Discharge status: Home / Self Care | Payer: BC Managed Care – PPO | Source: Ambulatory Visit | Attending: Radiation Oncology | Admitting: Radiation Oncology

## 2011-11-02 DIAGNOSIS — C01 Malignant neoplasm of base of tongue: Secondary | ICD-10-CM

## 2011-11-02 DIAGNOSIS — C139 Malignant neoplasm of hypopharynx, unspecified: Secondary | ICD-10-CM

## 2011-11-02 MED ORDER — BIAFINE EX EMUL
CUTANEOUS | Status: DC | PRN
  Administered 2011-11-02: 1 via TOPICAL

## 2011-11-02 NOTE — Progress Notes (Signed)
Pt states "it feels like peg tube is pulling all the time'. Placed 2 1/2 wks ago. Gave pt mesh top to try around her waist over peg tube.

## 2011-11-02 NOTE — Progress Notes (Signed)
POST SIM DONE, GAVE PT "RADIATION AND Y" BOOKLET, BIAFINE;  ALL QUESTIONS ANSWERED.

## 2011-11-02 NOTE — Progress Notes (Signed)
DIAGNOSIS:  Hypopharyngeal carcinoma.  NARRATIVE:  [name] is seen today for weekly assessment.  She has completed 800 cGy of a planned 7000 cGy directed at the neck region. The patient is tolerating her treatments well thus far.  She denies any odynophagia or dysphagia at this point.  The patient has noticed some slight drainage from her right neck lymph node but no bleeding.  The patient did begin her radiosensitizing chemotherapy late last week.  The patient does have some discomfort at the PEG site.  The patient has not started using her PEG feeding tube as of yet.  PHYSICAL EXAMINATION:  Vital Signs:  The patient's weight is 135.4 pounds.  Lungs:  The lungs are clear.  Heart:  Heart has a regular rhythm and rate.  Neck:  Examination of the neck area reveals palpable protruding lymph nodes on the right mid neck.  There is no obvious drainage from this area at this time.  HEENT:  Examination of the oral cavity reveals no secondary infection.  The mucosa is moist.  IMPRESSION AND PLAN:  The patient is tolerating her treatments well thus far.  The patient's radiation fields are setting up accurately.  The patient's radiation chart was checked today.  The plan is to continue to a cumulative dose of 7000 cGy along with radiosensitizing chemotherapy.    ______________________________ Billie Lade, Ph.D., M.D. JDK/MEDQ  D:  11/02/2011  T:  11/02/2011  Job:  [id number]

## 2011-11-03 ENCOUNTER — Telehealth: Payer: Self-pay | Admitting: *Deleted

## 2011-11-03 ENCOUNTER — Other Ambulatory Visit: Payer: BC Managed Care – PPO | Admitting: Lab

## 2011-11-03 ENCOUNTER — Other Ambulatory Visit: Payer: Self-pay

## 2011-11-03 ENCOUNTER — Other Ambulatory Visit: Payer: Self-pay | Admitting: Oncology

## 2011-11-03 ENCOUNTER — Encounter: Payer: Self-pay | Admitting: *Deleted

## 2011-11-03 ENCOUNTER — Ambulatory Visit
Admission: RE | Admit: 2011-11-03 | Discharge: 2011-11-03 | Disposition: A | Discharge status: Home / Self Care | Payer: BC Managed Care – PPO | Source: Ambulatory Visit | Attending: Radiation Oncology | Admitting: Radiation Oncology

## 2011-11-03 DIAGNOSIS — C139 Malignant neoplasm of hypopharynx, unspecified: Secondary | ICD-10-CM

## 2011-11-03 LAB — BASIC METABOLIC PANEL
Chloride: 98 mEq/L (ref 96–112)
Potassium: 4.5 mEq/L (ref 3.5–5.3)
Sodium: 138 mEq/L (ref 135–145)

## 2011-11-03 NOTE — Telephone Encounter (Signed)
Message from [facility] nurse left yesterday regarding pt's PEG tube continuing to hurt.  Called [facility] RN, Domenic Polite, back at [phone], and she states pt's PEG tube continues to cause pt a lot pain and discourages pt from activity 3 weeks s/p placement.   Attempted to call pt at home and no answer.  Called cell# and her husband answered states pt probably at home.  Spoke w/ husband, Karren Burly, and he confirms pt seems to still be having a lot of pain at PEG tube site.  Pt has appt for IVFs tomorrow.  Instructed husband to have pt tell Infusion room nurse her concerns so we can assess her PEG tube while she is here and notify Dr. Gaylyn Rong of appearance of PEG and pt's complaints at that time.  He verbalized understanding.

## 2011-11-04 ENCOUNTER — Ambulatory Visit (HOSPITAL_BASED_OUTPATIENT_CLINIC_OR_DEPARTMENT_OTHER): Payer: BC Managed Care – PPO

## 2011-11-04 ENCOUNTER — Ambulatory Visit
Admission: RE | Admit: 2011-11-04 | Discharge: 2011-11-04 | Disposition: A | Discharge status: Home / Self Care | Payer: BC Managed Care – PPO | Source: Ambulatory Visit | Attending: Radiation Oncology | Admitting: Radiation Oncology

## 2011-11-04 ENCOUNTER — Other Ambulatory Visit: Payer: Self-pay | Admitting: Oncology

## 2011-11-04 ENCOUNTER — Ambulatory Visit (HOSPITAL_COMMUNITY)
Admission: RE | Admit: 2011-11-04 | Discharge: 2011-11-04 | Disposition: A | Discharge status: Home / Self Care | Payer: BC Managed Care – PPO | Source: Ambulatory Visit | Attending: Oncology | Admitting: Oncology

## 2011-11-04 VITALS — BP 122/71 | HR 99 | Temp 98.0°F

## 2011-11-04 DIAGNOSIS — C139 Malignant neoplasm of hypopharynx, unspecified: Secondary | ICD-10-CM

## 2011-11-04 MED ORDER — SODIUM CHLORIDE 0.9 % IV SOLN
INTRAVENOUS | Status: DC
  Administered 2011-11-04: 10:00:00 via INTRAVENOUS

## 2011-11-04 NOTE — Progress Notes (Signed)
During IVF infusion, patient taken to interventional radiology department to have her G-tube accessed due to pain in area. Upon return, she can already tell an improvement in her pain level.

## 2011-11-04 NOTE — Patient Instructions (Signed)
 Cancer Center Discharge Instructions for Patients Receiving Chemotherapy    To help prevent nausea and vomiting , we encourage you to take your nausea medication Zofran 8 mg twice daily as needed for nausea and/or Compazine 10 mg every 6  Hours as needed for nausea. Take it as often as needed within order of instruction by physician.   If you develop nausea and vomiting that is not controlled by your nausea medication, call the clinic. If it is after clinic hours your family physician or the after hours number for the clinic or go to the Emergency Department.  Be sure to eat frequent, small meals and push po fluids to stay hydrated.   BELOW ARE SYMPTOMS THAT SHOULD BE REPORTED IMMEDIATELY:  *FEVER GREATER THAN 100.5 F  *CHILLS WITH OR WITHOUT FEVER  NAUSEA AND VOMITING THAT IS NOT CONTROLLED WITH YOUR NAUSEA MEDICATION  *UNUSUAL SHORTNESS OF BREATH  *UNUSUAL BRUISING OR BLEEDING  TENDERNESS IN MOUTH AND THROAT WITH OR WITHOUT PRESENCE OF ULCERS  *URINARY PROBLEMS  *BOWEL PROBLEMS  UNUSUAL RASH Items with * indicate a potential emergency and should be followed up as soon as possible.   Feel free to call the clinic you have any questions or concerns. The clinic phone number is [phone].      __________________________________________  _____________  __________    __________________________________________

## 2011-11-05 ENCOUNTER — Ambulatory Visit
Admission: RE | Admit: 2011-11-05 | Discharge: 2011-11-05 | Disposition: A | Discharge status: Home / Self Care | Payer: BC Managed Care – PPO | Source: Ambulatory Visit | Attending: Radiation Oncology | Admitting: Radiation Oncology

## 2011-11-05 ENCOUNTER — Ambulatory Visit: Payer: Self-pay

## 2011-11-06 ENCOUNTER — Ambulatory Visit: Payer: Self-pay

## 2011-11-08 ENCOUNTER — Ambulatory Visit
Admission: RE | Admit: 2011-11-08 | Discharge: 2011-11-08 | Disposition: A | Discharge status: Home / Self Care | Payer: BC Managed Care – PPO | Source: Ambulatory Visit | Attending: Radiation Oncology | Admitting: Radiation Oncology

## 2011-11-10 ENCOUNTER — Other Ambulatory Visit (HOSPITAL_BASED_OUTPATIENT_CLINIC_OR_DEPARTMENT_OTHER): Payer: BC Managed Care – PPO | Admitting: Lab

## 2011-11-10 ENCOUNTER — Ambulatory Visit
Admission: RE | Admit: 2011-11-10 | Discharge: 2011-11-10 | Disposition: A | Discharge status: Home / Self Care | Payer: BC Managed Care – PPO | Source: Ambulatory Visit | Attending: Radiation Oncology | Admitting: Radiation Oncology

## 2011-11-10 ENCOUNTER — Telehealth: Payer: Self-pay | Admitting: *Deleted

## 2011-11-10 DIAGNOSIS — C139 Malignant neoplasm of hypopharynx, unspecified: Secondary | ICD-10-CM

## 2011-11-10 LAB — BASIC METABOLIC PANEL
CO2: 26 mEq/L (ref 19–32)
Chloride: 102 mEq/L (ref 96–112)
Creatinine, Ser: 0.69 mg/dL (ref 0.50–1.10)
Potassium: 4 mEq/L (ref 3.5–5.3)
Sodium: 137 mEq/L (ref 135–145)

## 2011-11-10 NOTE — Telephone Encounter (Signed)
BMET reviewed by Dr. Gaylyn Rong and he instructed may cancel IVFs for next 2 days as long as pt w/o n/v/d and able to take plenty of fluids via PEG tube.  Called pt at home and she denies any nausea, vomiting or diarrhea.  States able to keep fluids and nutrition via PEG tube.  Encouraged pt to continue to push fluids along w/ tube feedings.  Instructed no need for IVFs as scheduled next 2 days.  She verbalized understanding.   Pt does report her PEG still feels uncomfortable especially at her breast bone after sitting for prolonged period of time.  States was evaluated by IR on 11/04/11 and they loosened her tube, but discomfort remains.   Pt states going to wait until next week and if still bothering her she will let us know.   Called Michelle in Infusion to cancel IVF appts .Marland Kitchen

## 2011-11-10 NOTE — Progress Notes (Signed)
Weekly Management Note Current Dose: Gy  Projected Dose: Gy   Narrative:  The patient presents for routine under treatment assessment.  CBCT/MVCT images/Port film x-rays were reviewed.  The chart was checked. Notes a "tickle in her throat" leading to a cough. Took a narcotic containing anti-tussive with good relief. Eating by mouth. No nausea now. PEG tube feels better.   Physical Findings:  2 cm are aof tumor through skin on right neck. Skin is slightly dark.   Vitals: There were no vitals filed for this visit. Weight:  Wt Readings from Last 3 Encounters:  11/10/11 137 lb 8 oz (62.37 kg)  11/02/11 135 lb 6.4 oz (61.417 kg)  10/27/11 135 lb 14.4 oz (61.644 kg)   Lab Results  Component Value Date   WBC 6.3 10/27/2011   HGB 12.9 10/27/2011   HCT 38.7 10/27/2011   MCV 92.3 10/27/2011   PLT 357 10/27/2011   Lab Results  Component Value Date   CREATININE 0.69 11/10/2011   BUN 14 11/10/2011   NA 137 11/10/2011   K 4.0 11/10/2011   CL 102 11/10/2011   CO2 26 11/10/2011     Impression:  The patient is tolerating radiation.  Plan:  Continue treatment as planned. Gave scripts for lortab (instructed not to take with cough med that she took before or with norco), and MMW with instructions on use. Will check for dietary consult.

## 2011-11-10 NOTE — Progress Notes (Signed)
Pt states she "has begun to have tickle in her throat".  Took Hydrocodone-Chlorpheniram which was a prior prescription she received for cough. Pt will ask dr if this is all right for her to take for cough.  Had peg tube checked in radiology dept last week, was loosened.

## 2011-11-11 ENCOUNTER — Ambulatory Visit
Admission: RE | Admit: 2011-11-11 | Discharge: 2011-11-11 | Disposition: A | Discharge status: Home / Self Care | Payer: BC Managed Care – PPO | Source: Ambulatory Visit | Attending: Radiation Oncology | Admitting: Radiation Oncology

## 2011-11-11 ENCOUNTER — Ambulatory Visit: Payer: Self-pay

## 2011-11-12 ENCOUNTER — Ambulatory Visit: Payer: Self-pay

## 2011-11-12 ENCOUNTER — Ambulatory Visit
Admission: RE | Admit: 2011-11-12 | Discharge: 2011-11-12 | Disposition: A | Discharge status: Home / Self Care | Payer: BC Managed Care – PPO | Source: Ambulatory Visit | Attending: Radiation Oncology | Admitting: Radiation Oncology

## 2011-11-15 ENCOUNTER — Ambulatory Visit
Admission: RE | Admit: 2011-11-15 | Discharge: 2011-11-15 | Disposition: A | Discharge status: Home / Self Care | Payer: BC Managed Care – PPO | Source: Ambulatory Visit | Attending: Radiation Oncology | Admitting: Radiation Oncology

## 2011-11-17 ENCOUNTER — Ambulatory Visit
Admission: RE | Admit: 2011-11-17 | Discharge: 2011-11-17 | Disposition: A | Discharge status: Home / Self Care | Payer: BC Managed Care – PPO | Source: Ambulatory Visit | Attending: Radiation Oncology | Admitting: Radiation Oncology

## 2011-11-17 ENCOUNTER — Other Ambulatory Visit (HOSPITAL_BASED_OUTPATIENT_CLINIC_OR_DEPARTMENT_OTHER): Payer: BC Managed Care – PPO | Admitting: Lab

## 2011-11-17 VITALS — Wt 136.9 lb

## 2011-11-17 DIAGNOSIS — C139 Malignant neoplasm of hypopharynx, unspecified: Secondary | ICD-10-CM

## 2011-11-17 LAB — CBC WITH DIFFERENTIAL/PLATELET
BASO%: 1 % (ref 0.0–2.0)
Basophils Absolute: 0 10*3/uL (ref 0.0–0.1)
EOS%: 3.8 % (ref 0.0–7.0)
HGB: 11.6 g/dL (ref 11.6–15.9)
MCH: 30.1 pg (ref 25.1–34.0)
MONO#: 0.3 10*3/uL (ref 0.1–0.9)
RDW: 13.6 % (ref 11.2–14.5)
WBC: 2.9 10*3/uL — ABNORMAL LOW (ref 3.9–10.3)
lymph#: 0.8 10*3/uL — ABNORMAL LOW (ref 0.9–3.3)

## 2011-11-17 LAB — COMPREHENSIVE METABOLIC PANEL
AST: 16 U/L (ref 0–37)
Albumin: 4.2 g/dL (ref 3.5–5.2)
BUN: 14 mg/dL (ref 6–23)
CO2: 27 mEq/L (ref 19–32)
Calcium: 9.4 mg/dL (ref 8.4–10.5)
Chloride: 102 mEq/L (ref 96–112)
Creatinine, Ser: 0.8 mg/dL (ref 0.50–1.10)
Glucose, Bld: 87 mg/dL (ref 70–99)
Potassium: 4.5 mEq/L (ref 3.5–5.3)

## 2011-11-17 NOTE — Progress Notes (Signed)
Weekly Management Note Current Dose:26 Gy  Projected Dose:70 Gy   Narrative:  The patient presents for routine under treatment assessment.  CBCT/MVCT images/Port film x-rays were reviewed.  The chart was checked. Taking Lortab at night. Nausea with mucus.  Eating well. Labs pending today after this appt.   Physical Findings:  Unchanged. Rt neck is slightly dark. Open area has closed. Still skin pocking.   Vitals: There were no vitals filed for this visit. Weight:  Wt Readings from Last 3 Encounters:  11/17/11 136 lb 14.4 oz (62.097 kg)  11/10/11 137 lb 8 oz (62.37 kg)  11/02/11 135 lb 6.4 oz (61.417 kg)   Lab Results  Component Value Date   WBC 6.3 10/27/2011   HGB 12.9 10/27/2011   HCT 38.7 10/27/2011   MCV 92.3 10/27/2011   PLT 357 10/27/2011   Lab Results  Component Value Date   CREATININE 0.69 11/10/2011   BUN 14 11/10/2011   NA 137 11/10/2011   K 4.0 11/10/2011   CL 102 11/10/2011   CO2 26 11/10/2011     Impression:  The patient is tolerating radiation well.  Plan:  Continue treatment as planned. Continue use of lortab prn.

## 2011-11-17 NOTE — Progress Notes (Signed)
ONLY HAS NAUSEA WHEN SHE TRIES TO GET UP MUCUS.  EATING SOFT FOODS OH BUT TASTE HAS CHANGED SO SHE DOESN'T EAT AS WELL.

## 2011-11-18 ENCOUNTER — Ambulatory Visit
Admission: RE | Admit: 2011-11-18 | Discharge: 2011-11-18 | Disposition: A | Discharge status: Home / Self Care | Payer: BC Managed Care – PPO | Source: Ambulatory Visit | Attending: Radiation Oncology | Admitting: Radiation Oncology

## 2011-11-18 ENCOUNTER — Ambulatory Visit (HOSPITAL_BASED_OUTPATIENT_CLINIC_OR_DEPARTMENT_OTHER): Payer: BC Managed Care – PPO

## 2011-11-18 ENCOUNTER — Ambulatory Visit: Payer: BC Managed Care – PPO | Admitting: Nutrition

## 2011-11-18 VITALS — BP 128/80 | HR 93 | Temp 97.0°F

## 2011-11-18 DIAGNOSIS — C01 Malignant neoplasm of base of tongue: Secondary | ICD-10-CM

## 2011-11-18 DIAGNOSIS — Z5111 Encounter for antineoplastic chemotherapy: Secondary | ICD-10-CM

## 2011-11-18 DIAGNOSIS — C139 Malignant neoplasm of hypopharynx, unspecified: Secondary | ICD-10-CM

## 2011-11-18 MED ORDER — DEXAMETHASONE SODIUM PHOSPHATE 4 MG/ML IJ SOLN
12.0000 mg | Freq: Once | INTRAMUSCULAR | Status: AC
  Administered 2011-11-18: 12 mg via INTRAVENOUS

## 2011-11-18 MED ORDER — MANNITOL 25 % IV SOLN
Freq: Once | INTRAVENOUS | Status: AC
  Administered 2011-11-18: 10:00:00 via INTRAVENOUS
  Filled 2011-11-18: qty 10

## 2011-11-18 MED ORDER — PALONOSETRON HCL INJECTION 0.25 MG/5ML
0.2500 mg | Freq: Once | INTRAVENOUS | Status: AC
  Administered 2011-11-18: 0.25 mg via INTRAVENOUS

## 2011-11-18 MED ORDER — SODIUM CHLORIDE 0.9 % IV SOLN
100.0000 mg/m2 | Freq: Once | INTRAVENOUS | Status: AC
  Administered 2011-11-18: 163 mg via INTRAVENOUS
  Filled 2011-11-18: qty 163

## 2011-11-18 MED ORDER — SODIUM CHLORIDE 0.9 % IV SOLN
150.0000 mg | Freq: Once | INTRAVENOUS | Status: AC
  Administered 2011-11-18: 150 mg via INTRAVENOUS
  Filled 2011-11-18: qty 5

## 2011-11-18 NOTE — Patient Instructions (Signed)
1605 Pt ambulatory upon discharge with daughter at side and verbalized understanding of next appt date/time.  Verbalized also to call if any problems.

## 2011-11-18 NOTE — Progress Notes (Signed)
The patient's weight is overall stable at 136 pounds.  She is eating well.  She has some taste alterations.  She complains of nausea associated with increased mucus.  She is not using her feeding tube at this time as she is able to maintain her weight on her oral intake alone.  NUTRITION DIAGNOSIS:  Food and nutrition related knowledge deficit and diagnosis of predicted suboptimal energy intake both continue.  NTERVENTION:  I have provided support and encouragement to the patient and daughter to continue high-calorie, high-protein foods throughout the day to promote weight maintenance.  I have encouraged her to continue to increase her fluid intake.  She is to continue to flush her feeding tube with water on a regular basis.  She is to continue [employer] as needed to support weight maintenance.  I have answered her questions.  MONITORING/EVALUATION/GOALS:  The patient continues to tolerate oral diet to minimize weight loss to no more than 5% of her usual body weight.  NEXT VISIT:  Thursday, January 24th.    ______________________________ Zenovia Jarred, RD, LDN Clinical Nutrition Specialist BN/MEDQ  D:  11/18/2011  T:  11/18/2011  Job:  [id number]

## 2011-11-18 NOTE — Progress Notes (Signed)
0900-OK to treat with CBC results from 11/17/11 per Dr. Gaylyn Rong.

## 2011-11-19 ENCOUNTER — Telehealth: Payer: Self-pay | Admitting: *Deleted

## 2011-11-19 ENCOUNTER — Ambulatory Visit
Admission: RE | Admit: 2011-11-19 | Discharge: 2011-11-19 | Disposition: A | Discharge status: Home / Self Care | Payer: BC Managed Care – PPO | Source: Ambulatory Visit | Attending: Radiation Oncology | Admitting: Radiation Oncology

## 2011-11-19 NOTE — Telephone Encounter (Signed)
Pt called to ask if it is ok to take lorazepam first for nausea?  Pt had chemo yesterday.  Reviewed nausea meds w/ pt and instructed to start Zofran 3 days after chemo since she got the [place] pre med.  Instructed she may alternate taking lorazepam and compazine as needed for any nausea.  Cautioned pt regarding sedation from medications.  Instructed her to call back if nausea meds not effective as directed.  Pt states no vomiting, only slight nausea at this time.  She verbalized understanding.

## 2011-11-22 ENCOUNTER — Ambulatory Visit
Admission: RE | Admit: 2011-11-22 | Discharge: 2011-11-22 | Disposition: A | Discharge status: Home / Self Care | Payer: BC Managed Care – PPO | Source: Ambulatory Visit | Attending: Radiation Oncology | Admitting: Radiation Oncology

## 2011-11-23 ENCOUNTER — Ambulatory Visit
Admission: RE | Admit: 2011-11-23 | Discharge: 2011-11-23 | Disposition: A | Discharge status: Home / Self Care | Payer: BC Managed Care – PPO | Source: Ambulatory Visit | Attending: Radiation Oncology | Admitting: Radiation Oncology

## 2011-11-23 ENCOUNTER — Ambulatory Visit: Payer: BC Managed Care – PPO | Admitting: Nutrition

## 2011-11-23 VITALS — Wt 132.6 lb

## 2011-11-23 DIAGNOSIS — C139 Malignant neoplasm of hypopharynx, unspecified: Secondary | ICD-10-CM

## 2011-11-23 MED ORDER — ENSURE PLUS PO LIQD: ORAL | Status: DC

## 2011-11-23 NOTE — Progress Notes (Signed)
Weekly Management Note Current Dose:34 Gy  Projected Dose:70 Gy   Narrative:  The patient presents for routine under treatment assessment.  CBCT/MVCT images/Port film x-rays were reviewed.  The chart was checked. No appetite and felt miserable last week after chemo.  Better now. Lortab at night only. Would like to put "food" down her tube and did eggs yesterday.   Physical Findings: Skin dark rt neck > left.   Vitals: There were no vitals filed for this visit. Weight:  Wt Readings from Last 3 Encounters:  11/23/11 132 lb 9.6 oz (60.147 kg)  11/17/11 136 lb 14.4 oz (62.097 kg)  11/10/11 137 lb 8 oz (62.37 kg)   Lab Results  Component Value Date   WBC 2.9* 11/17/2011   HGB 11.6 11/17/2011   HCT 35.3 11/17/2011   MCV 91.5 11/17/2011   PLT 209 11/17/2011   Lab Results  Component Value Date   CREATININE 0.80 11/17/2011   BUN 14 11/17/2011   NA 139 11/17/2011   K 4.5 11/17/2011   CL 102 11/17/2011   CO2 27 11/17/2011     Impression:  The patient is tolerating radiation.  Plan:  Continue treatment as planned. Ask dietician to see. Start stool softener. Use lortab prn.

## 2011-11-23 NOTE — Progress Notes (Signed)
TALKS ABOUT USING PEG TUBE AND IF SHE CAN PREPARE FOOD TO PUT THROUGH IT, I TOLD HER IT HAD TO BE LIQUID FORM OR IT WOULD GET CLOGGED UP.  SHE SAID SHE HAD A MACHINE THAT WOUL MAKE IT LIQUID.  I TOLD HER TO TALK TO DR. Lacretia Nicks ABOUT IT.  SHE HAS A WT LOSS OF 3.5 LBS.  ALSO C/O SINUS PROBLEMS THINKS SHE HAS A SINUS INFECTION.  TEMP...98.0...BP...118/82

## 2011-11-23 NOTE — Telephone Encounter (Signed)
error 

## 2011-11-23 NOTE — Progress Notes (Signed)
I received a call from Radiation Oncology that patient was attempting to put food through her feeding tube.  I was asked if I could come speak with the patient about the need for beginning tube feeding.  When I spoke to the patient, she reports she received a [employer] over the weekend and she has tried blenderizing scrambled eggs and she put those through her feeding tube.  However, she is concerned regarding cleanliness and clogging, so she is looking for advice.  Weight has decreased to 132 pounds.  Patient reports her oral intake has decreased primarily because of the way food tastes.  NUTRITION DIAGNOSIS:  Food and nutrition related knowledge deficit continues.  Diagnosis of predicted suboptimal energy intake has evolved into inadequate oral intake related to decreased taste as evidenced by 4 pound weight loss.  INTERVENTION:  Patient will begin 1 can of Ensure Plus via her feeding tube t.i.d. at 10 o'clock in the morning, 3 o'clock in the afternoon and 8 o'clock at night.  She will flush with 90 mL of free water before and after each bolus feeding.  This is to supplement her oral diet.  She is to continue to try to increase her oral intake at breakfast, lunch and dinner.  I have instructed the patient on these tube feeding instructions and written these out for her.  Patient is agreeable.  MONITORING/EVALUATION/GOALS:  The patient will tolerate oral diet and tube feeding to minimize weight loss to no more than 5% of her usual body weight.  NEXT VISIT:  Scheduled on Tuesday, January 15.    ______________________________ Zenovia Jarred, RD, LDN Clinical Nutrition Specialist BN/MEDQ  D:  11/23/2011  T:  11/23/2011  Job:  [id number]

## 2011-11-24 ENCOUNTER — Ambulatory Visit
Admission: RE | Admit: 2011-11-24 | Discharge: 2011-11-24 | Disposition: A | Discharge status: Home / Self Care | Payer: BC Managed Care – PPO | Source: Ambulatory Visit | Attending: Radiation Oncology | Admitting: Radiation Oncology

## 2011-11-24 ENCOUNTER — Ambulatory Visit: Payer: BC Managed Care – PPO | Attending: Oncology

## 2011-11-24 DIAGNOSIS — IMO0001 Reserved for inherently not codable concepts without codable children: Secondary | ICD-10-CM | POA: Insufficient documentation

## 2011-11-24 DIAGNOSIS — C029 Malignant neoplasm of tongue, unspecified: Secondary | ICD-10-CM | POA: Insufficient documentation

## 2011-11-25 ENCOUNTER — Telehealth: Payer: Self-pay | Admitting: Oncology

## 2011-11-25 ENCOUNTER — Encounter: Payer: Self-pay | Admitting: Oncology

## 2011-11-25 ENCOUNTER — Other Ambulatory Visit: Payer: Self-pay | Admitting: Oncology

## 2011-11-25 ENCOUNTER — Ambulatory Visit
Admission: RE | Admit: 2011-11-25 | Discharge: 2011-11-25 | Disposition: A | Discharge status: Home / Self Care | Payer: BC Managed Care – PPO | Source: Ambulatory Visit | Attending: Radiation Oncology | Admitting: Radiation Oncology

## 2011-11-25 DIAGNOSIS — C139 Malignant neoplasm of hypopharynx, unspecified: Secondary | ICD-10-CM

## 2011-11-25 NOTE — Telephone Encounter (Signed)
S/w the pt and she is aware to pick up an appt calendar when she comes in for her rad tx appt.

## 2011-11-25 NOTE — Progress Notes (Signed)
Put patient's cancer forms in registration desk.

## 2011-11-25 NOTE — Progress Notes (Signed)
Put cancer policy on nurse's desk. °

## 2011-11-26 ENCOUNTER — Ambulatory Visit
Admission: RE | Admit: 2011-11-26 | Discharge: 2011-11-26 | Disposition: A | Discharge status: Home / Self Care | Payer: BC Managed Care – PPO | Source: Ambulatory Visit | Attending: Radiation Oncology | Admitting: Radiation Oncology

## 2011-11-26 ENCOUNTER — Telehealth: Payer: Self-pay | Admitting: *Deleted

## 2011-11-29 ENCOUNTER — Ambulatory Visit
Admission: RE | Admit: 2011-11-29 | Discharge: 2011-11-29 | Disposition: A | Discharge status: Home / Self Care | Payer: BC Managed Care – PPO | Source: Ambulatory Visit | Attending: Radiation Oncology | Admitting: Radiation Oncology

## 2011-11-29 NOTE — Progress Notes (Signed)
CALLED IN RX FOR LORTAB ELIXER Friday PER DR. MOODY, PHARMACY FILLED WITH TABS.  SHE LEFT THEM THERE AND I CALLED BACK TODAY TO CLARIFY ORDER WAS FOR LORTAB ELIXER AND NOT TABS.  THERE WAS NO PROBLEM WITH.  PT TO PICK UP

## 2011-11-30 ENCOUNTER — Ambulatory Visit
Admission: RE | Admit: 2011-11-30 | Discharge: 2011-11-30 | Disposition: A | Discharge status: Home / Self Care | Payer: BC Managed Care – PPO | Source: Ambulatory Visit | Attending: Radiation Oncology | Admitting: Radiation Oncology

## 2011-11-30 ENCOUNTER — Other Ambulatory Visit: Payer: BC Managed Care – PPO | Admitting: Lab

## 2011-11-30 ENCOUNTER — Ambulatory Visit: Payer: BC Managed Care – PPO | Admitting: Nutrition

## 2011-11-30 DIAGNOSIS — C139 Malignant neoplasm of hypopharynx, unspecified: Secondary | ICD-10-CM

## 2011-11-30 LAB — BASIC METABOLIC PANEL
BUN: 10 mg/dL (ref 6–23)
CO2: 30 mEq/L (ref 19–32)
Chloride: 98 mEq/L (ref 96–112)
Potassium: 4.4 mEq/L (ref 3.5–5.3)

## 2011-11-30 NOTE — Progress Notes (Signed)
Weekly Management Note Current Dose:44 Gy  Projected Dose:70 Gy   Narrative:  The patient presents for routine under treatment assessment.  CBCT/MVCT images/Port film x-rays were reviewed.  The chart was checked. Using lortab twice a day with good relief. Using biafene on skin. Still swallowing and not using PEG.  Has appt with Barb neff after   Physical Findings:  Hyperpigmented skin around bilateral neck. OP shows mucocitis in the most posterior aspect. Alert and awake.   Vitals: Weight:  Wt Readings from Last 3 Encounters:  11/30/11 131 lb 1.6 oz (59.467 kg)  11/23/11 132 lb 9.6 oz (60.147 kg)  11/17/11 136 lb 14.4 oz (62.097 kg)   Lab Results  Component Value Date   WBC 2.9* 11/17/2011   HGB 11.6 11/17/2011   HCT 35.3 11/17/2011   MCV 91.5 11/17/2011   PLT 209 11/17/2011   Lab Results  Component Value Date   CREATININE 0.80 11/17/2011   BUN 14 11/17/2011   NA 139 11/17/2011   K 4.5 11/17/2011   CL 102 11/17/2011   CO2 27 11/17/2011     Impression:  The patient is tolerating radiation.  Plan:  Continue treatment as planned. Continue lortab and biafene. We discussed the use of duragesic patches possibly by the end of the week.

## 2011-11-30 NOTE — Progress Notes (Signed)
C/O PAIN IN THROAT, 6/10.  USING LORTAB ELIXER WITH RELIEF.  HAS NOT STARTED PEG FEEDING YET STILL TRYING TO EAT BUT HAS ALTERED TASTE.  NOTED SKIN DARKER WITH SOME DRY DESQUAMATION.  WT OK, I.8 LB LOSS SINCE LAST WEEK.

## 2011-11-30 NOTE — Progress Notes (Signed)
INTERVAL HISTORY:  [name] presents to nutrition followup.  Her weight has decreased to 131 pounds, which is a 1 pound decrease from last week but is a 7 pound decrease overall, which is 5% of her starting weight.  The patient reports the home care agency did deliver Ensure Plus to her home along with feeding supplies.  However, she has not begun to use her feeding tube yet.  She reports she is drinking Ensure Plus by mouth twice a day.  Her other dietary intake is inadequate in calories and protein.  NUTRITION DIAGNOSIS:  Food and nutrition related knowledge deficit and inadequate oral intake continues.  INTERVENTION:  I have encouraged [name] to increase Ensure Plus to q.i.d. either by mouth or via tube.  We discussed the importance of increasing her overall protein intake and she is going to attempt to puree some meat using her Vitamix today to see if she can tolerate this by mouth.  I provided her with some protein samples to incorporate into a smoothie for her to drink in the morning.  I have again educated and stressed the importance of increasing calories and protein to minimize any further weight loss.  MONITORING, EVALUATION, AND GOALS:  The patient has been unable to tolerate oral diet and has had approximately 5% weight loss.  NEXT VISIT:  Thursday, 12/09/2011.    ______________________________ Zenovia Jarred, RD, LDN Clinical Nutrition Specialist BN/MEDQ  D:  11/30/2011  T:  11/30/2011  Job:  [id number]

## 2011-12-01 ENCOUNTER — Ambulatory Visit (HOSPITAL_BASED_OUTPATIENT_CLINIC_OR_DEPARTMENT_OTHER): Payer: BC Managed Care – PPO | Admitting: Oncology

## 2011-12-01 ENCOUNTER — Ambulatory Visit
Admission: RE | Admit: 2011-12-01 | Discharge: 2011-12-01 | Disposition: A | Discharge status: Home / Self Care | Payer: BC Managed Care – PPO | Source: Ambulatory Visit | Attending: Radiation Oncology | Admitting: Radiation Oncology

## 2011-12-01 ENCOUNTER — Other Ambulatory Visit: Payer: Self-pay | Admitting: Lab

## 2011-12-01 ENCOUNTER — Ambulatory Visit (HOSPITAL_COMMUNITY): Payer: Medicaid - Dental | Admitting: Dentistry

## 2011-12-01 VITALS — BP 121/65 | HR 104 | Temp 98.0°F | Ht 60.0 in | Wt 131.7 lb

## 2011-12-01 VITALS — BP 135/75 | HR 88 | Temp 97.4°F

## 2011-12-01 DIAGNOSIS — C139 Malignant neoplasm of hypopharynx, unspecified: Secondary | ICD-10-CM

## 2011-12-01 DIAGNOSIS — R432 Parageusia: Secondary | ICD-10-CM

## 2011-12-01 DIAGNOSIS — K117 Disturbances of salivary secretion: Secondary | ICD-10-CM

## 2011-12-01 DIAGNOSIS — R634 Abnormal weight loss: Secondary | ICD-10-CM

## 2011-12-01 DIAGNOSIS — R439 Unspecified disturbances of smell and taste: Secondary | ICD-10-CM

## 2011-12-01 DIAGNOSIS — R1310 Dysphagia, unspecified: Secondary | ICD-10-CM

## 2011-12-01 MED ORDER — FENTANYL 25 MCG/HR TD PT72: 1.0000 | MEDICATED_PATCH | TRANSDERMAL | Status: DC

## 2011-12-01 NOTE — Progress Notes (Signed)
[facility] OFFICE PROGRESS NOTE  No primary provider on file.  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   CURRENT THERAPY:  concurrent q3wk Cisplatin 10/28/2011, s/p C2 of planned 3 treatments, last on 12/09/2011, and daily XRT  (23 of 35 as of today)  INTERVAL HISTORY:  Sherry Craig 66 y.o. female returns for a follow up visit. She still has hoarse voice. Her radiation treatments are well tolerated. She has some skin radiation changes in the area treated with topical biafene with good results. She has not used her PEG tube on a regular basis. She prefers to try oral intake. She states that she will try to mash some meat next week to see if she can swallow it. Dr. Penelope Coop has been monitoring from the nutrition standpoint. She has lost a total of 5 lbs since last visit. Of note, she has tried to put eggs and apple sauce via the tube, but was reeducated to only place liquid forms of nourishment as instructed by her nutritionist. In addition she has been seen by Dr. Kristin Bruins, odontology who has instructed her on the importance of oral rinsing with water and peroxide in addition to the use of biotene. Her pain is controlled with Lortab elixir, but she is entertaining the idea of a  Fentanyl patch for better management.  Patient denies  Increased fatigue, headache, visual changes, confusion, drenching night sweats, palpable lymph node swelling, mucositis,odynophagia, dysphagia, nausea vomiting, jaundice, chest pain, palpitation, shortness of breath, dyspnea on exertion, productive cough, gum bleeding, epistaxis, hematemesis, hemoptysis, abdominal pain, abdominal swelling, early satiety, melena, hematochezia, hematuria, skin rash, spontaneous bleeding, joint swelling, joint pain, heat or cold intolerance, bowel bladder incontinence, back pain, focal motor weakness, paresthesia, depression, suicidal or homicidal ideation, feeling hopelessness.         MEDICAL HISTORY: Past Medical History  Diagnosis Date  . Hyperlipemia   . Allergy   . Recurrent sinus infections   . 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 Tongue cancer   . GERD (gastroesophageal reflux disease)   . Osteopenia     SURGICAL HISTORY:  Past Surgical History  Procedure Date  . Tubal ligation     MEDICATIONS: Current Outpatient Prescriptions  Medication Sig Dispense Refill  . Alum & Mag Hydroxide-Simeth (MAGIC MOUTHWASH W/LIDOCAINE) SOLN Take 5 mLs by mouth 3 (three) times daily as needed.        Marland Kitchen atorvastatin (LIPITOR) 20 MG tablet Take 20 mg by mouth daily.        . calcium carbonate (OS-CAL) 1250 MG chewable tablet Chew 1 tablet by mouth daily.       . chlorpheniramine (CHLOR-TRIMETON) 2 MG/5ML syrup Take 2 mg by mouth at bedtime as needed. Prn cough       . Cholecalciferol (VITAMIN D) 2000 UNITS CAPS Take 2,000 Units by mouth daily.        . Ensure Plus (ENSURE PLUS) LIQD Begin one can of Ensure Plus via feeding tube TID with 90 cc free water before and after each bolus feeding at 1000, 1500, and 2000 daily as tolerated.  711 mL  0  . fentaNYL (DURAGESIC - DOSED MCG/HR) 25 MCG/HR Place 1 patch (25 mcg total) onto the skin every 3 (three) days.  10 patch  0  . fexofenadine-pseudoephedrine (ALLEGRA-D 24) 180-240 MG per 24 hr tablet Take 1 tablet by mouth daily as needed. For allergies      . fluticasone (FLONASE) 50 MCG/ACT nasal spray  Place 2 sprays into the nose daily as needed. For allergies      . HYDROcodone-acetaminophen (NORCO) 5-325 MG per tablet       . ibuprofen (ADVIL,MOTRIN) 800 MG tablet       . LORazepam (ATIVAN) 0.5 MG tablet Take 0.5 mg by mouth every 6 (six) hours as needed. For anxiety or nausea       . omeprazole (PRILOSEC) 40 MG capsule Take 40 mg by mouth daily.        . ondansetron (ZOFRAN) 8 MG tablet Take 1 tablet two times a day as needed for nausea or vomiting starting on the third day after chemotherapy.  30 tablet  3  .  Polyethyl Glycol-Propyl Glycol (SYSTANE OP) Place 1 drop into both eyes 3 (three) times daily as needed. For allergies       . prochlorperazine (COMPAZINE) 10 MG tablet Take 1 tablet (10 mg total) by mouth every 6 (six) hours as needed (Nausea or vomiting).  30 tablet  3    ALLERGIES:   has no known allergies.  REVIEW OF SYSTEMS:  The rest of the 14-point review of system was negative.   Filed Vitals:   12/01/11 1217  BP: 121/65  Pulse: 104  Temp: 98 F (36.7 C)   Wt Readings from Last 3 Encounters:  12/01/11 131 lb 11.2 oz (59.739 kg)  11/30/11 131 lb 1.6 oz (59.467 kg)  11/23/11 132 lb 9.6 oz (60.147 kg)   ECOG Performance status:   PHYSICAL EXAMINATION:   Gen: Well-nourished, in no acute distress. Eyes: No scleral icterus or jaundice. ENT: There was no oropharyngeal lesions on my unaided exam. Neck was supple without thyromegaly. R>L radiation changes. Lymphatics: Positive for a 3x2cm right level IV node. There was no supraclavicular, axillary, or inguinal adenopathy. Respiratory: Lungs were clear the left without wheezing or crackles. Cardiovascular: Heart rate and rhythm; S1/S2; without murmur, rubs, or gallop. There was no pedal edema. GI: Abdomen was soft, flat, nontender, nondistended, without organomegaly. PEG tube in place without erythema/discharge/pain. Musculoskeletal exam: No spinal tenderness on palpation of vertebral spine. Skin exam was without ecchymosis, petechiae. Neuro exam was nonfocal. Patient was able to get on and off exam table without assistance. Gait was normal. Patient was alert and oriented. Attention was good. Language was appropriate. Mood was normal without depression. Speech was not pressured. Thought content was not tangential.       LABORATORY/RADIOLOGY DATA:  No results found for this basename: WBC:5,HGB:5,HCT:5,PLT:5,MCV:5,MCH:5,MCHC:5,RDW:5,NEUTRABS:5,LYMPHSABS:5,MONOABS:5,EOSABS:5,BASOSABS:5,BANDABS:5,BANDSABD:5 in the last 168  hours  CMP    Lab 11/30/11 1013  NA 138  K 4.4  CL 98  CO2 30  GLUCOSE 96  BUN 10  CREATININE 0.77  CALCIUM 9.5  MG --  AST --  ALT --  ALKPHOS --  BILITOT --        Component Value Date/Time   BILITOT 0.3 11/17/2011 1002     Radiology Studies:  No results found.     ASSESSMENT AND PLAN:   1.Hypopharyngeal SCC: S/p 2 of 3 cycles of chemotherapy with Cisplatin, for the 3rd and final treatment on 1/24, with concurrent XRT, planned to be completed in about 2 weeks. Pt tolerating the process well.   2. Mucositis prevention: I advised her to use salt/baking soda alternating with Biotene and fluoride.   3. Nausea/vomiting prophylaxis:SHe has been educated on the importance of Compazine/Zofran/Ativan prior to chemo and prn.   4. Hyperlipidemia: She's on Lipitor per PCP.   5. F/U: Chemo cisplatin 12/09/2011  She has lab weekly. She has 3 hr tentative for IVF 3 days next week and the week after in case she develops severe dehydration from chemo. Dr. Gaylyn Rong will see her on after the completion of chemotherapy with labs.  Pt knows to call if she has any questions or concerns.   Greater than 50 percent of the visit has been spent in coordination of care, and 95 percent of the visit has been spent by Dr. Gaylyn Rong face to face with the patient.

## 2011-12-01 NOTE — Progress Notes (Signed)
Wednesday, December 01, 2011   BP: 135/75                   P: 88             T: 97.4            Wgt: 131 lbs now. Was 138 lbs @ start of treatment.  Sherry Craig is a 66 year old female with SCCa of the Hypopharynx. Patient presents for a periodic oral examination during radiation therapy.   Patient has completed  23 of 35 radiation treatments.  REVIEW OF CHIEF COMPLAINTS: DRY MOUTH: Yes HARD TO SWALLOW: Yes HURT TO SWALLOW: Yes TASTE CHANGES: Has no taste SORES IN MOUTH: No TRISMUS SYMPTOMS: Having no problems with trismus SYMPTOM RELIEF:  Using Biotene Rinses. HOME ORAL HYGIENE REGIMEN:  BRUSHING: 2 X a day FLOSSING: 1 X a day RINSING: see above FLUORIDE: Doing fluoride at night and is having no problems. TRISMUS EXERCISES: Maximum interincisal opening: 47 mm.   DENTAL EXAM: ORAL HYGIENE(PLAQUE):Minimal plaque. Very good oral hygiene. LOCATION OF MUCOSITIS: None noted. DESCRIPTION OF SALIVA: Decreased, foamy saliva. Mild xerostomia. ANY EXPOSED BONE:None noted. OTHER WATCHED AREAS: History of root canal therapy #20. No symptoms. DIAGNOSES: 1.  Xerostomia  2.  Dysgeusia 3.  Dysphagia 4.  Odynophagia 5.  Weight Loss  RECOMMENDATIONS: 1. Brush after meals and at bedtime. Use fluoride at bedtime. 2. Use trismus exercises as directed. 3. Use Biotene Rinse or salt water/baking soda rinses. 4. Multiple sips of water as needed. 5. RTC in two months for periodic oral exam --status post radiation therapy . Call if problems before then.   Dr. Kristin Bruins

## 2011-12-02 ENCOUNTER — Ambulatory Visit
Admission: RE | Admit: 2011-12-02 | Discharge: 2011-12-02 | Disposition: A | Discharge status: Home / Self Care | Payer: BC Managed Care – PPO | Source: Ambulatory Visit | Attending: Radiation Oncology | Admitting: Radiation Oncology

## 2011-12-02 ENCOUNTER — Telehealth: Payer: Self-pay | Admitting: Oncology

## 2011-12-02 NOTE — Telephone Encounter (Signed)
called pt and informed her of appt for 1-31

## 2011-12-03 ENCOUNTER — Ambulatory Visit
Admission: RE | Admit: 2011-12-03 | Discharge: 2011-12-03 | Disposition: A | Discharge status: Home / Self Care | Payer: BC Managed Care – PPO | Source: Ambulatory Visit | Attending: Radiation Oncology | Admitting: Radiation Oncology

## 2011-12-06 ENCOUNTER — Ambulatory Visit
Admission: RE | Admit: 2011-12-06 | Discharge: 2011-12-06 | Disposition: A | Discharge status: Home / Self Care | Payer: BC Managed Care – PPO | Source: Ambulatory Visit | Attending: Radiation Oncology | Admitting: Radiation Oncology

## 2011-12-07 ENCOUNTER — Ambulatory Visit
Admission: RE | Admit: 2011-12-07 | Discharge: 2011-12-07 | Disposition: A | Discharge status: Home / Self Care | Payer: BC Managed Care – PPO | Source: Ambulatory Visit | Attending: Radiation Oncology | Admitting: Radiation Oncology

## 2011-12-07 ENCOUNTER — Other Ambulatory Visit: Payer: BC Managed Care – PPO | Admitting: Lab

## 2011-12-07 DIAGNOSIS — C139 Malignant neoplasm of hypopharynx, unspecified: Secondary | ICD-10-CM

## 2011-12-07 LAB — COMPREHENSIVE METABOLIC PANEL
Albumin: 4.1 g/dL (ref 3.5–5.2)
Alkaline Phosphatase: 62 U/L (ref 39–117)
BUN: 9 mg/dL (ref 6–23)
Creatinine, Ser: 0.71 mg/dL (ref 0.50–1.10)
Glucose, Bld: 98 mg/dL (ref 70–99)
Total Bilirubin: 0.3 mg/dL (ref 0.3–1.2)

## 2011-12-07 LAB — CBC WITH DIFFERENTIAL/PLATELET
Basophils Absolute: 0 10*3/uL (ref 0.0–0.1)
Eosinophils Absolute: 0.1 10*3/uL (ref 0.0–0.5)
HGB: 10.5 g/dL — ABNORMAL LOW (ref 11.6–15.9)
LYMPH%: 15.6 % (ref 14.0–49.7)
MCV: 92.1 fL (ref 79.5–101.0)
MONO%: 11.4 % (ref 0.0–14.0)
NEUT#: 1 10*3/uL — ABNORMAL LOW (ref 1.5–6.5)
Platelets: 145 10*3/uL (ref 145–400)

## 2011-12-07 MED ORDER — HYDROCODONE-ACETAMINOPHEN 7.5-500 MG/15ML PO SOLN: 15.0000 mL | Freq: Four times a day (QID) | ORAL | Status: DC | PRN

## 2011-12-07 NOTE — Progress Notes (Signed)
Weekly Management Note Current Dose:54 Gy  Projected Dose:70 Gy   Narrative:  The patient presents for routine under treatment assessment.  CBCT/MVCT images/Port film x-rays were reviewed.  The chart was checked. Started on duragesic patch by Dr. Gaylyn Rong. Now only using lortab 1-2 times per day. Still taking all nutrition orally. Skin is irritated but she is managing.   Physical Findings:  Unchanged. Neck sin is dark but intact. No moist desquamation.  Vitals: There were no vitals filed for this visit. Weight:  Wt Readings from Last 3 Encounters:  12/07/11 130 lb 12.8 oz (59.33 kg)  12/01/11 131 lb 11.2 oz (59.739 kg)  11/30/11 131 lb 1.6 oz (59.467 kg)   Lab Results  Component Value Date   WBC 1.5* 12/07/2011   HGB 10.5* 12/07/2011   HCT 30.6* 12/07/2011   MCV 92.1 12/07/2011   PLT 145 12/07/2011   Lab Results  Component Value Date   CREATININE 0.77 11/30/2011   BUN 10 11/30/2011   NA 138 11/30/2011   K 4.4 11/30/2011   CL 98 11/30/2011   CO2 30 11/30/2011     Impression:  The patient is tolerating radiation.  Plan:  Continue treatment as planned. Encouraged po. Continue biafene. Possible chemo end of the week.

## 2011-12-07 NOTE — Progress Notes (Signed)
Noted dry desquamation on neck with skin appearing darker, using biafine.  C/o sore throat, rates 6/10 , goes down to 3/10 with Lortab elixer.  Not using feeding tube yet still taking nutrition po

## 2011-12-08 ENCOUNTER — Other Ambulatory Visit: Payer: Self-pay | Admitting: Oncology

## 2011-12-08 ENCOUNTER — Ambulatory Visit
Admission: RE | Admit: 2011-12-08 | Discharge: 2011-12-08 | Disposition: A | Discharge status: Home / Self Care | Payer: BC Managed Care – PPO | Source: Ambulatory Visit | Attending: Radiation Oncology | Admitting: Radiation Oncology

## 2011-12-08 DIAGNOSIS — C139 Malignant neoplasm of hypopharynx, unspecified: Secondary | ICD-10-CM

## 2011-12-09 ENCOUNTER — Other Ambulatory Visit: Payer: Self-pay | Admitting: Oncology

## 2011-12-09 ENCOUNTER — Other Ambulatory Visit: Payer: Self-pay | Admitting: *Deleted

## 2011-12-09 ENCOUNTER — Ambulatory Visit: Payer: BC Managed Care – PPO | Admitting: Nutrition

## 2011-12-09 ENCOUNTER — Ambulatory Visit: Payer: BC Managed Care – PPO

## 2011-12-09 ENCOUNTER — Ambulatory Visit (HOSPITAL_BASED_OUTPATIENT_CLINIC_OR_DEPARTMENT_OTHER): Payer: BC Managed Care – PPO

## 2011-12-09 ENCOUNTER — Ambulatory Visit
Admission: RE | Admit: 2011-12-09 | Discharge: 2011-12-09 | Disposition: A | Discharge status: Home / Self Care | Payer: BC Managed Care – PPO | Source: Ambulatory Visit | Attending: Radiation Oncology | Admitting: Radiation Oncology

## 2011-12-09 DIAGNOSIS — C139 Malignant neoplasm of hypopharynx, unspecified: Secondary | ICD-10-CM

## 2011-12-09 LAB — CBC WITH DIFFERENTIAL/PLATELET
Basophils Absolute: 0.1 10*3/uL (ref 0.0–0.1)
Eosinophils Absolute: 0.1 10*3/uL (ref 0.0–0.5)
HGB: 10.6 g/dL — ABNORMAL LOW (ref 11.6–15.9)
MCV: 90.4 fL (ref 79.5–101.0)
MONO%: 18.2 % — ABNORMAL HIGH (ref 0.0–14.0)
NEUT#: 0.6 10*3/uL — ABNORMAL LOW (ref 1.5–6.5)
Platelets: 162 10*3/uL (ref 145–400)
RDW: 14.1 % (ref 11.2–14.5)

## 2011-12-09 LAB — TECHNOLOGIST REVIEW

## 2011-12-09 NOTE — Progress Notes (Signed)
PAtient brought to the infusion room awaiting lab results.  Per Dr. Gaylyn Rong, will not treat due to low ANC.

## 2011-12-09 NOTE — Progress Notes (Signed)
Ms. Sherry Craig did not receive chemotherapy today; however, I did speak with her regarding her nutrition status.  She reports that she has not been eating very well.  She is only tolerating some soups and Ensure Plus b.i.d.  Most recent weight was documented as 130 pounds on Tuesday, January 22nd, which is down 1 pound from last week.  Patient verbalizes the need for her to begin tube feeding and reports that she is going to begin this today.  NUTRITION DIAGNOSIS:  Food and nutrition related knowledge deficit and inadequate oral intake both continue.  INTERVENTION:  I have again provided support and encouragement for Sherry Craig to begin Ensure Plus via feeding tube.  She understands to flush with 90 cc of water before and after each bolus feeding.  She will begin with 1 can at a time to total 4 cans a day either via feeding tube or by mouth.  She was able to verbalize feeding regimen back to me on the phone.  MONITORING, EVALUATION AND GOALS:  The patient will tolerate initiation of bolus tube feeding in addition to oral diet to minimize further weight loss.  NEXT VISIT:  Friday, January 25th.  I will call the patient on the phone to followup on tube feeding success.    ______________________________ Zenovia Jarred, RD, LDN Clinical Nutrition Specialist BN/MEDQ  D:  12/09/2011  T:  12/09/2011  Job:  [id number]

## 2011-12-10 ENCOUNTER — Telehealth: Payer: Self-pay | Admitting: Nutrition

## 2011-12-10 ENCOUNTER — Ambulatory Visit
Admission: RE | Admit: 2011-12-10 | Discharge: 2011-12-10 | Disposition: A | Discharge status: Home / Self Care | Payer: BC Managed Care – PPO | Source: Ambulatory Visit | Attending: Radiation Oncology | Admitting: Radiation Oncology

## 2011-12-10 NOTE — Telephone Encounter (Signed)
Attempted to follow up on Tube feeding success by phone today.  Patient did not answer phone but I left a message for her to return my call.

## 2011-12-13 ENCOUNTER — Ambulatory Visit
Admission: RE | Admit: 2011-12-13 | Discharge: 2011-12-13 | Disposition: A | Discharge status: Home / Self Care | Payer: BC Managed Care – PPO | Source: Ambulatory Visit | Attending: Radiation Oncology | Admitting: Radiation Oncology

## 2011-12-14 ENCOUNTER — Ambulatory Visit
Admission: RE | Admit: 2011-12-14 | Discharge: 2011-12-14 | Disposition: A | Discharge status: Home / Self Care | Payer: BC Managed Care – PPO | Source: Ambulatory Visit | Attending: Radiation Oncology | Admitting: Radiation Oncology

## 2011-12-14 ENCOUNTER — Encounter: Payer: Self-pay | Admitting: Radiation Oncology

## 2011-12-14 ENCOUNTER — Other Ambulatory Visit: Payer: Self-pay | Admitting: Radiation Oncology

## 2011-12-14 DIAGNOSIS — C139 Malignant neoplasm of hypopharynx, unspecified: Secondary | ICD-10-CM

## 2011-12-14 MED ORDER — HYDROCODONE-ACETAMINOPHEN 7.5-500 MG/15ML PO SOLN: 15.0000 mL | Freq: Four times a day (QID) | ORAL | Status: AC | PRN

## 2011-12-14 MED ORDER — FENTANYL 50 MCG/HR TD PT72: 1.0000 | MEDICATED_PATCH | TRANSDERMAL | Status: AC

## 2011-12-14 NOTE — Progress Notes (Signed)
Encounter addended by: Lurline Hare, MD on: 12/14/2011 12:34 PM<BR>     Documentation filed: Notes Section

## 2011-12-14 NOTE — Progress Notes (Signed)
Pt wearing 25 mcg Duragesic patch left forearm, took 20ml Lortab elixir this am at 0830 am, pain now between 8-10 stil, neck has peeled,dry desquamation, no drainage, still using biafine cream, ,doing swallowing exercises, still has some hoarseness, ensure plus via peg tube 3x day trying to increase to 4, sometimes only gets 2 in daily ,water flushing 90cc before and after , pt ortho vitals wnl,  10:27 AM

## 2011-12-14 NOTE — Progress Notes (Signed)
Encounter addended by: Lurline Hare, MD on: 12/14/2011  1:05 PM<BR>     Documentation filed: Inpatient Notes

## 2011-12-14 NOTE — Progress Notes (Signed)
Weekly Management Note Current Dose:62 Gy  Projected Dose:70 Gy   Narrative:  The patient presents for routine under treatment assessment.  CBCT/MVCT images/Port film x-rays were reviewed.  The chart was checked. Pain increasing Using 20ml of lortab several times per day. Using tube but only 2 cans per day. Swallowing just water. Needs refill on lortab and duragesic.  Physical Findings:  Unchanged. Neck is pink and dark. No moist desquamation. Wt down 5 pounds.  Vitals:  Filed Vitals:   12/14/11 1024  BP: 109/64  Pulse: 74  Temp:   Resp: 20   Weight:  Wt Readings from Last 3 Encounters:  12/14/11 125 lb 6.4 oz (56.881 kg)  12/07/11 130 lb 12.8 oz (59.33 kg)  12/01/11 131 lb 11.2 oz (59.739 kg)   Lab Results  Component Value Date   WBC 1.8* 12/09/2011   HGB 10.6* 12/09/2011   HCT 32.0* 12/09/2011   MCV 90.4 12/09/2011   PLT 162 12/09/2011   Lab Results  Component Value Date   CREATININE 0.71 12/07/2011   BUN 9 12/07/2011   NA 136 12/07/2011   K 4.2 12/07/2011   CL 99 12/07/2011   CO2 27 12/07/2011     Impression:  The patient is tolerating radiation.  Plan:  Continue treatment as planned. Encouraged increased tube feeds. Double duragesic to 50 mcg. Scripts given. F/u in 2 weeks.

## 2011-12-15 ENCOUNTER — Ambulatory Visit
Admission: RE | Admit: 2011-12-15 | Discharge: 2011-12-15 | Disposition: A | Discharge status: Home / Self Care | Payer: BC Managed Care – PPO | Source: Ambulatory Visit | Attending: Radiation Oncology | Admitting: Radiation Oncology

## 2011-12-16 ENCOUNTER — Ambulatory Visit (HOSPITAL_BASED_OUTPATIENT_CLINIC_OR_DEPARTMENT_OTHER): Payer: BC Managed Care – PPO | Admitting: Oncology

## 2011-12-16 ENCOUNTER — Ambulatory Visit: Payer: BC Managed Care – PPO | Admitting: Nutrition

## 2011-12-16 ENCOUNTER — Telehealth: Payer: Self-pay | Admitting: Oncology

## 2011-12-16 ENCOUNTER — Ambulatory Visit
Admission: RE | Admit: 2011-12-16 | Discharge: 2011-12-16 | Disposition: A | Discharge status: Home / Self Care | Payer: BC Managed Care – PPO | Source: Ambulatory Visit | Attending: Radiation Oncology | Admitting: Radiation Oncology

## 2011-12-16 ENCOUNTER — Other Ambulatory Visit: Payer: Self-pay | Admitting: Oncology

## 2011-12-16 ENCOUNTER — Other Ambulatory Visit (HOSPITAL_BASED_OUTPATIENT_CLINIC_OR_DEPARTMENT_OTHER): Payer: BC Managed Care – PPO | Admitting: Lab

## 2011-12-16 VITALS — BP 114/57 | HR 77 | Temp 97.6°F | Ht 60.0 in | Wt 128.3 lb

## 2011-12-16 DIAGNOSIS — C139 Malignant neoplasm of hypopharynx, unspecified: Secondary | ICD-10-CM

## 2011-12-16 DIAGNOSIS — K1230 Oral mucositis (ulcerative), unspecified: Secondary | ICD-10-CM

## 2011-12-16 DIAGNOSIS — Z09 Encounter for follow-up examination after completed treatment for conditions other than malignant neoplasm: Secondary | ICD-10-CM

## 2011-12-16 DIAGNOSIS — K121 Other forms of stomatitis: Secondary | ICD-10-CM

## 2011-12-16 DIAGNOSIS — E440 Moderate protein-calorie malnutrition: Secondary | ICD-10-CM

## 2011-12-16 LAB — CBC WITH DIFFERENTIAL/PLATELET
Basophils Absolute: 0 10*3/uL (ref 0.0–0.1)
EOS%: 2.9 % (ref 0.0–7.0)
HCT: 30.1 % — ABNORMAL LOW (ref 34.8–46.6)
HGB: 10.3 g/dL — ABNORMAL LOW (ref 11.6–15.9)
LYMPH%: 15 % (ref 14.0–49.7)
MCH: 31.5 pg (ref 25.1–34.0)
MCV: 92.4 fL (ref 79.5–101.0)
MONO%: 16.4 % — ABNORMAL HIGH (ref 0.0–14.0)
NEUT%: 65.3 % (ref 38.4–76.8)
RDW: 15.8 % — ABNORMAL HIGH (ref 11.2–14.5)

## 2011-12-16 LAB — BASIC METABOLIC PANEL
CO2: 34 mEq/L — ABNORMAL HIGH (ref 19–32)
Chloride: 100 mEq/L (ref 96–112)
Creatinine, Ser: 0.7 mg/dL (ref 0.50–1.10)

## 2011-12-16 NOTE — Progress Notes (Signed)
Ms. Craige Cotta reports that she is no longer eating by mouth.  She is tolerating 4 cans of Ensure Plus via PEG with 90 cc of water before and after each bolus feeding.  She only tolerates 1 can at a time.  Her weight has increased to 128.3 pounds from 125.4 pounds.  She admits that she has not been very consistent at trying to drink water.  She reports that she is increasing her peroxide rinses per her physician to help cut down on the phlegm that she is experiencing.  NUTRITION DIAGNOSIS:  Food and nutrition-related knowledge deficit and inadequate oral intake both continue.  INTERVENTION:  I have recommended that the patient increase Ensure Plus to 5 cans a day, continuing 90 cc of free water before and after each feeding.  She will give 1 can Ensure Plus via PEG approximately every 3 hours. In addition, she will add 1 packet of [place] protein powder to provide an additional 20 grams of protein in 8 ounces of water.  The patient will be receiving 1850 calories, 85 g protein and a total of 2040 mL of free water daily.  This meets minimum caloric needs and is a bit shy on her protein needs; however, the patient reports she is going to try to consume a yogurt every day to make up for some additional protein.  MONITORING, EVALUATION AND GOALS:  The patient will tolerate an increase in tube feeding plus the addition of protein powder and some additional water to meet minimum needs and promote weight maintenance.  NEXT VISIT:  I will follow up with the patient either on the phone or during her next chemotherapy which is yet to be scheduled.    ______________________________ Zenovia Jarred, RD, LDN Clinical Nutrition Specialist BN/MEDQ  D:  12/16/2011  T:  12/16/2011  Job:  [id number]

## 2011-12-16 NOTE — Progress Notes (Signed)
[facility] OFFICE PROGRESS NOTE  No primary provider on file.  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   CURRENT THERAPY:  concurrent q3wk Cisplatin started on 10/28/2011 and daily radiation.   INTERVAL HISTORY:  Sherry Craig 66 y.o. female returns for a follow up visit with her husband.  She has been having more mucositis pain.  Rad Onc recently increased her Fentanyl patch from to .  She still requires about 3x/day of Lortab elixir.  She has started using PEG tube about 4cans Ensure/day.  She puts in about 720cc of fluid along with her Ensure (90cc x 8/day).  She has not taken much by mouth; but she is still doing swallowing exercise per Speech Path.  She has xerotomia and thick phlegm; however, she has not been doing mouth rinse with diluted H2O2 regularly.  She thus has frequent gagging but no frank nausea/vomiting.  Her PEG tube is clean without purulent discharge, pain or erythema.  She has mild fatigue; however, she is still independent of all activities of daily living.  She denies tinnitus and hearing loss.  Her right cervical node swelling has completely resolved with therapy.  She still has intermittent hoarse voice.   Patient denies headache, visual changes, confusion, drenching night sweats, jaundice, chest pain, palpitation, shortness of breath, dyspnea on exertion, productive cough, gum bleeding, epistaxis, hematemesis, hemoptysis, abdominal pain, abdominal swelling, early satiety, melena, hematochezia, hematuria, skin rash, spontaneous bleeding, joint swelling, joint pain, heat or cold intolerance, bowel bladder incontinence, back pain, focal motor weakness, paresthesia, depression, suicidal or homocidal ideation, feeling hopelessness.       MEDICAL HISTORY: Past Medical History  Diagnosis Date  . Hyperlipemia   . Allergy   . Recurrent sinus infections   . 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 Tongue  cancer   . GERD (gastroesophageal reflux disease)   . Osteopenia     SURGICAL HISTORY:  Past Surgical History  Procedure Date  . Tubal ligation     MEDICATIONS: Current Outpatient Prescriptions  Medication Sig Dispense Refill  . Alum & Mag Hydroxide-Simeth (MAGIC MOUTHWASH W/LIDOCAINE) SOLN Take 5 mLs by mouth 3 (three) times daily as needed.        Marland Kitchen atorvastatin (LIPITOR) 20 MG tablet Take 20 mg by mouth daily.        . calcium carbonate (OS-CAL) 1250 MG chewable tablet Chew 1 tablet by mouth daily.       . chlorpheniramine (CHLOR-TRIMETON) 2 MG/5ML syrup Take 2 mg by mouth at bedtime as needed. Prn cough       . Cholecalciferol (VITAMIN D) 2000 UNITS CAPS Take 2,000 Units by mouth daily.        . Ensure Plus (ENSURE PLUS) LIQD Begin one can of Ensure Plus via feeding tube TID with 90 cc free water before and after each bolus feeding at 1000, 1500, and 2000 daily as tolerated.  711 mL  0  . fentaNYL (DURAGESIC - DOSED MCG/HR) 50 MCG/HR Place 1 patch (50 mcg total) onto the skin every 3 (three) days.  10 patch  0  . fexofenadine-pseudoephedrine (ALLEGRA-D 24) 180-240 MG per 24 hr tablet Take 1 tablet by mouth daily as needed. For allergies      . fluticasone (FLONASE) 50 MCG/ACT nasal spray Place 2 sprays into the nose daily as needed. For allergies      . HYDROcodone-acetaminophen (LORTAB) 7.5-500 MG/15ML solution Take 15 mLs by mouth every 6 (six) hours  as needed for pain.  480 mL  0  . LORazepam (ATIVAN) 0.5 MG tablet Take 0.5 mg by mouth every 6 (six) hours as needed. For anxiety or nausea       . omeprazole (PRILOSEC) 40 MG capsule Take 40 mg by mouth daily.        . ondansetron (ZOFRAN) 8 MG tablet Take 1 tablet two times a day as needed for nausea or vomiting starting on the third day after chemotherapy.  30 tablet  3  . Polyethyl Glycol-Propyl Glycol (SYSTANE OP) Place 1 drop into both eyes 3 (three) times daily as needed. For allergies       . prochlorperazine (COMPAZINE) 10 MG  tablet Take 1 tablet (10 mg total) by mouth every 6 (six) hours as needed (Nausea or vomiting).  30 tablet  3  . ibuprofen (ADVIL,MOTRIN) 800 MG tablet         ALLERGIES:   has no known allergies.  REVIEW OF SYSTEMS:  The rest of the 14-point review of system was negative.   Filed Vitals:   12/16/11 1013  BP: 114/57  Pulse: 77  Temp: 97.6 F (36.4 C)   Wt Readings from Last 3 Encounters:  12/16/11 128 lb 4.8 oz (58.196 kg)  12/14/11 125 lb 6.4 oz (56.881 kg)  12/07/11 130 lb 12.8 oz (59.33 kg)   ECOG Performance status:   PHYSICAL EXAMINATION:   Gen: Well-nourished, in no acute distress. Eyes: No scleral icterus or jaundice. ENT: There was no oropharyngeal lesions on my unaided exam. Neck was supple without thyromegaly. R>L radiation changes. Lymphatics: complete resolution of right cervical node.  There was no supraclavicular, axillary, or inguinal adenopathy. Respiratory: Lungs were clear the left without wheezing or crackles. Cardiovascular: Heart rate and rhythm; S1/S2; without murmur, rubs, or gallop. There was no pedal edema. GI: Abdomen was soft, flat, nontender, nondistended, without organomegaly. PEG tube in place without erythema/discharge/pain. Musculoskeletal exam: No spinal tenderness on palpation of vertebral spine. Skin exam was without ecchymosis, petechiae. Neuro exam was nonfocal. Patient was able to get on and off exam table without assistance. Gait was normal. Patient was alert and oriented. Attention was good. Language was appropriate. Mood was normal without depression. Speech was not pressured. Thought content was not tangential.       LABORATORY/RADIOLOGY DATA:   Lab 12/16/11 0947  WBC 2.1*  HGB 10.3*  HCT 30.1*  PLT 330  MCV 92.4  MCH 31.5  MCHC 34.0  RDW 15.8*  LYMPHSABS 0.3*  MONOABS 0.3  EOSABS 0.1  BASOSABS 0.0  BANDABS --      ASSESSMENT AND PLAN:   1.Hypopharyngeal SCC: S/p 2 of 3 cycles of chemotherapy with Cisplatin.  Her ANC today  is 1.4.  I recommended to proceed with the last dose of cisplatin within the next 5-7 days.  The last dose will be reduced by 25% given recent neutropenia.  Patient and husband weighed the risk and benefit.  Given that her chance of cure of hypopharynx SCC is only about 30%, she agreed to do what is standard of care to achieve the best response as opposed to cutting short the therapy.   2. Mucositis:  I advised her to continue with Fentanyl 72mcg/hr transdermal changed q72 hours.  I advised her to take Lortab elixir up to 4 x/day to help her resuming her oral intake and increase fluid intake to about 2L daily to decrease risk of renal insufficiency.  I advised her to increase her frequency  of diluted H2O2 (1:4 ratio) to thin out her thick phlegm.   3.  Moderate calorie/protein malnutrition:  I advised her to continue with Ensure per Nutrition advice.  Her weight has not changed much.  I advised her to increase oral intake so that she has low chance of PEG tube dependency.    4.  Nausea/vomiting prophylaxis: she has Compazine/Zofran/Ativan prn.   5.   Hyperlipidemia: She's on Lipitor per PCP.   6.  F/U: with me in about 3 weeks to ensure that she is recovering appropriately.     Marland Kitchen

## 2011-12-16 NOTE — Telephone Encounter (Signed)
called pt and provided appts for [id number] °

## 2011-12-17 ENCOUNTER — Inpatient Hospital Stay
Admission: RE | Admit: 2011-12-17 | Discharge: 2011-12-17 | Payer: Self-pay | Source: Ambulatory Visit | Attending: Radiation Oncology | Admitting: Radiation Oncology

## 2011-12-17 ENCOUNTER — Ambulatory Visit
Admission: RE | Admit: 2011-12-17 | Discharge: 2011-12-17 | Disposition: A | Discharge status: Home / Self Care | Payer: BC Managed Care – PPO | Source: Ambulatory Visit | Attending: Radiation Oncology | Admitting: Radiation Oncology

## 2011-12-17 ENCOUNTER — Encounter: Payer: Self-pay | Admitting: *Deleted

## 2011-12-17 DIAGNOSIS — C139 Malignant neoplasm of hypopharynx, unspecified: Secondary | ICD-10-CM

## 2011-12-17 DIAGNOSIS — C01 Malignant neoplasm of base of tongue: Secondary | ICD-10-CM

## 2011-12-17 MED ORDER — EMOLLIENT BASE EX CREA: TOPICAL_CREAM | CUTANEOUS | Status: DC | PRN

## 2011-12-17 MED ORDER — BIAFINE EX EMUL
CUTANEOUS | Status: DC | PRN
  Administered 2011-12-17: 09:00:00 via TOPICAL

## 2011-12-17 NOTE — Progress Notes (Signed)
Pt throat reddened, skin intact, pt requested tube of biafine cream, forgot to get the other day stated pt, still hoarsenmess, but 1 more chemotherapy tx also stated by pt, pt has 1 month follow up appt,completed tx here today 9:30 AM

## 2011-12-21 ENCOUNTER — Other Ambulatory Visit: Payer: Self-pay | Admitting: Oncology

## 2011-12-21 DIAGNOSIS — C139 Malignant neoplasm of hypopharynx, unspecified: Secondary | ICD-10-CM

## 2011-12-22 ENCOUNTER — Ambulatory Visit: Payer: BC Managed Care – PPO | Attending: Oncology

## 2011-12-22 DIAGNOSIS — IMO0001 Reserved for inherently not codable concepts without codable children: Secondary | ICD-10-CM | POA: Insufficient documentation

## 2011-12-22 DIAGNOSIS — C029 Malignant neoplasm of tongue, unspecified: Secondary | ICD-10-CM | POA: Insufficient documentation

## 2011-12-23 ENCOUNTER — Ambulatory Visit (HOSPITAL_BASED_OUTPATIENT_CLINIC_OR_DEPARTMENT_OTHER): Payer: BC Managed Care – PPO

## 2011-12-23 ENCOUNTER — Other Ambulatory Visit: Payer: Self-pay | Admitting: *Deleted

## 2011-12-23 ENCOUNTER — Encounter: Payer: Self-pay | Admitting: Certified Registered Nurse Anesthetist

## 2011-12-23 ENCOUNTER — Ambulatory Visit: Payer: BC Managed Care – PPO | Admitting: Nutrition

## 2011-12-23 VITALS — BP 128/78 | HR 96 | Temp 99.1°F

## 2011-12-23 DIAGNOSIS — C139 Malignant neoplasm of hypopharynx, unspecified: Secondary | ICD-10-CM

## 2011-12-23 DIAGNOSIS — Z5111 Encounter for antineoplastic chemotherapy: Secondary | ICD-10-CM

## 2011-12-23 LAB — CBC WITH DIFFERENTIAL/PLATELET
EOS%: 2.2 % (ref 0.0–7.0)
MCH: 31.4 pg (ref 25.1–34.0)
MCHC: 33.6 g/dL (ref 31.5–36.0)
MCV: 93.3 fL (ref 79.5–101.0)
MONO%: 13.9 % (ref 0.0–14.0)
RBC: 3.42 10*6/uL — ABNORMAL LOW (ref 3.70–5.45)
RDW: 16.8 % — ABNORMAL HIGH (ref 11.2–14.5)

## 2011-12-23 LAB — BASIC METABOLIC PANEL
Calcium: 9.8 mg/dL (ref 8.4–10.5)
Potassium: 3.9 mEq/L (ref 3.5–5.3)
Sodium: 136 mEq/L (ref 135–145)

## 2011-12-23 MED ORDER — POTASSIUM CHLORIDE 2 MEQ/ML IV SOLN
Freq: Once | INTRAVENOUS | Status: AC
  Administered 2011-12-23: 09:00:00 via INTRAVENOUS
  Filled 2011-12-23: qty 10

## 2011-12-23 MED ORDER — CISPLATIN CHEMO INJECTION 100MG/100ML
75.0000 mg/m2 | Freq: Once | INTRAVENOUS | Status: AC
  Administered 2011-12-23: 122 mg via INTRAVENOUS
  Filled 2011-12-23: qty 122

## 2011-12-23 MED ORDER — SODIUM CHLORIDE 0.9 % IV SOLN
150.0000 mg | Freq: Once | INTRAVENOUS | Status: AC
  Administered 2011-12-23: 150 mg via INTRAVENOUS
  Filled 2011-12-23: qty 5

## 2011-12-23 MED ORDER — PALONOSETRON HCL INJECTION 0.25 MG/5ML
0.2500 mg | Freq: Once | INTRAVENOUS | Status: AC
  Administered 2011-12-23: 0.25 mg via INTRAVENOUS

## 2011-12-23 MED ORDER — DEXAMETHASONE SODIUM PHOSPHATE 4 MG/ML IJ SOLN
12.0000 mg | Freq: Once | INTRAMUSCULAR | Status: AC
  Administered 2011-12-23: 12 mg via INTRAVENOUS

## 2011-12-23 NOTE — Progress Notes (Unsigned)
Late entry: DOS 10/28/2011. Per Burnard Bunting RN, pt. received hydration over 4hrs total pre and post chemo on 10/28/11. HL

## 2011-12-23 NOTE — Progress Notes (Signed)
Pt voided 425 ccs prior to Cisplatin, voided 1250 ccs after Cisplatin started. Total urine output 1675.

## 2011-12-23 NOTE — Progress Notes (Signed)
Ms. Sherry Craig continues to tolerate Ensure Plus 4 cans daily via PEG. She has attempted several times to add some protein powder mixed with water to her feeding tube, but she reports that she has had some vomiting after that.  She attributes this to increased mucus and phlegm. She reports good tolerance of the protein powder if she sips it.  The patient has been unable to consistently tolerate 5 cans of Ensure Plus daily, which is her goal.  There is no new weight for the patient today, so I cannot evaluate whether or not her weight has declined.  NUTRITION DIAGNOSIS:  Food and nutrition related knowledge deficit and inadequate oral intake both continue.  INTERVENTION:  I have provided encouragement for the patient to attempt to increase Ensure Plus to 5 cans a day, with 90 cc of free water before and after each feeding, approximately every 3 hours.  I have encouraged her to divide the protein powder up into several feedings throughout the day mixed with her Ensure.  I have encouraged her to continue to gargle with peroxide and water to help control the excess phlegm and hopefully decrease the gagging and vomiting she experiences.  MONITORING, EVALUATION AND GOALS:  The patient will continue to work to tolerate tube feeding at goal plus the protein powder and free water to meet minimum nutrition needs and to promote weight maintenance.   NEXT VISIT:  I will follow up with the patient on the phone since this is her last chemotherapy treatment and her radiation treatments have completed.  ______________________________ Zenovia Jarred, RD, LDN Clinical Nutrition Specialist BN/MEDQ  D:  12/23/2011  T:  12/23/2011  Job:  [id number]

## 2011-12-25 NOTE — Progress Notes (Signed)
CC:   Exie Parody, M.D.  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  TREATMENT DATES:  10/28/2011 to 12/17/2011.  ANATOMIC REGION TREATED:  Right hypopharynx, involved lymph nodes, uninvolved lymph nodes.  DOSE:  70 Gy at 2 Gy per fraction x35 fractions.  BEAM ARRANGEMENT:  IMRT.  BEAM ENERGY:  Six MV photons.  CONCURRENT CHEMOTHERAPY:  Cisplatin.  TREATMENT TOLERANCE:  Sherry Craig tolerated her treatments remarkably well.  She continued to swallow throughout most of her treatment.  At the end of her treatment she was at about 4 cans of Ensure Plus per day in her PEG tube.  She lost a total of 12 pounds throughout the course of her treatment.  FOLLOWUP:  I will plan on seeing her back in 1 month's time.  She also has regular scheduled followups with Dr. Gaylyn Rong.  She was encouraged to contact us with any questions or concerns.  She agreed to do so.    ______________________________ Lurline Hare, M.D. SW/MEDQ  D:  12/25/2011  T:  12/25/2011  Job:  [id number]

## 2011-12-25 NOTE — Progress Notes (Signed)
IMRT Planning Note  [name] underwent Tomotherapy segmentation on 10/28/2011 for her locally advanced hypopharyngeal cancer. A total of 8 delivered field widths and 24 gantry rotations are required to deliver the planned dose.  This comprises one complex treatment device.  Special Treatment Procedure Note:  Ms Nadea Kirkland received her first radiation treatment on 10/28/11.  She also received concurrent chemotherapy. With the increased toxicities of concurrent treatment she will becarefully monitored including weekly lab checks and follow up with our dietician.  [facility] Radiation Oncology Simulation Verification Note   Name: Briggette Najarian MRN: [id number]   Date: 10/28/2011 DOB: 1945-11-24  Status:outpatient   DIAGNOSIS:  Hypopharynx cancer.  POSITION: Patient was placed in the supine position on the treatment machine.  The Tomotherapy registration was reviewed at the machine. I verified position of the target volumes as well as critical structures including the spinal cord. I approved the fusion for treatment.   NARRATIVE: Patient tolerated simulation well.

## 2011-12-28 ENCOUNTER — Telehealth: Payer: Self-pay | Admitting: Nutrition

## 2011-12-28 NOTE — Telephone Encounter (Signed)
Patient's husband had called me requesting my return call.  Patient has been experiencing nausea and vomiting and has been unable to tolerate TF.  Patient reports he spoke with RN who instructed him to have patient take her nausea medication as prescribed.  He reports that he has given her the medication.  I asked him to hold off on the TF until nausea was resolved/improved but encouraged him to push fluids as tolerated.  When patient is ready to attempt TF, I instructed him to begin with only 4 ounces slowly to assess tolerance.  She can progress back to regular enteral nutrition with resolution of nausea and vomiting.  Husband verbalized understanding.

## 2011-12-28 NOTE — Progress Notes (Signed)
Received call from husband stating that patient is coughing and vomiting 10-15 minutes after feeding; asked was patient taking antiemetics and after he consulted with patient, he found out that patient was not taking antiemetics; husband states that he took the day off work today, and he would work on making sure that patient begins taking her nausea meds regularly; states that he is also using humidifier and cough medication; informed patient to call office if he has more concerns.

## 2011-12-29 ENCOUNTER — Telehealth: Payer: Self-pay | Admitting: Nutrition

## 2011-12-29 NOTE — Telephone Encounter (Signed)
Patient's nausea and vomiting improved.  Patient now tolerating 4 ounces of Ensure Plus via feeding tube at a time.  Patient's husband called concerned he is going to run out of Ensure Plus.  He states [facility] delivered the formula originally.  I encouraged him to call them and let them know she needs more formula but that he could purchase some at the store as well.  He verbalized understanding and appreciation for assistance.

## 2011-12-31 ENCOUNTER — Telehealth: Payer: Self-pay | Admitting: *Deleted

## 2011-12-31 NOTE — Telephone Encounter (Signed)
Husband called to report pt has nasal congestion w/ post nasal drip causing persistent coughing especially at night.  Pt difficulty sleeping and abd and chest soreness d/t coughing. Denies any fevers or sob.  States mucous is yellow-greenish w/ blowing her nose.   Pt was taking Mucinex w/o any relief from cough. Asked if pt still has any hydrocodone at home and husband states she has the pills and  eilixir but has not needed it lately.  Suggested that pt try hydrocodone elixir at HS to help suppress her coughing so she can sleep.  Recommended pt use OTC Robitussin during the day and push fluids PO or via PEG to keep secretions thin.  Instructed to call for any fevers or sob.   Husband verbalized understanding.

## 2012-01-03 ENCOUNTER — Telehealth: Payer: Self-pay | Admitting: *Deleted

## 2012-01-05 ENCOUNTER — Encounter: Payer: Self-pay | Admitting: *Deleted

## 2012-01-06 ENCOUNTER — Ambulatory Visit
Admission: RE | Admit: 2012-01-06 | Discharge: 2012-01-06 | Disposition: A | Discharge status: Home / Self Care | Payer: BC Managed Care – PPO | Source: Ambulatory Visit | Attending: Radiation Oncology | Admitting: Radiation Oncology

## 2012-01-06 DIAGNOSIS — C139 Malignant neoplasm of hypopharynx, unspecified: Secondary | ICD-10-CM

## 2012-01-06 NOTE — Progress Notes (Signed)
CC:   Exie Parody, M.D. Kinnie Scales. Annalee Genta, M.D. Dario Guardian, M.D.  DIAGNOSIS:  Locally advanced hypopharyngeal cancer.  PREVIOUS INTERVENTIONS:  Chemoradiotherapy to a total dose of 70 Gy, completed 12/17/2011.  INTERVAL SINCE TREATMENT:  3 weeks.  INTERVAL HISTORY:  Sherry Craig reports for followup today.  She has done well. She has lost about 5 pounds in 3 weeks.  She is not using her feeding tube.  She has an appointment Dr. Gaylyn Rong tomorrow.  She has no imaging scheduled.  She tried to take off her Duragesic patch last week, but due to some sinus infection as well as some residual pain, she is back on 50 mcg.  She is not taking the Lortab elixir.  Her taste is still not back. She is swallowing all of her food.  She has followup scheduled with speech pathology.  PHYSICAL EXAMINATION:  She has some dark skin around her neck.  This is consistent with treatment effect.  Her oral cavity is actually quite moist.  She is alert and oriented x3.  Continue treatment as planned.  Dr. Gaylyn Rong will likely order her PET scan for the beginning of May and I will see her back around August for a physical examination and direct laryngoscopy.  I encouraged her to contact me with any sore throat or neck masses.  She agreed to do so.  I am very proud of her and she looks really great.    ______________________________ Lurline Hare, M.D. SW/MEDQ  D:  01/06/2012  T:  01/06/2012  Job:  [id number]

## 2012-01-06 NOTE — Progress Notes (Signed)
Here for routine 1 month follow up post completion of radiation on 12/17/2011 for dx of hypopharyngeal ca. Has upper respiratory infection which she is now taking levoquin daily. Appetite improved. Not using feeding tube in 2 weeks. Pain controlled with duragesic patch 25 mcg. Denies fatigue and nausea.

## 2012-01-07 NOTE — Telephone Encounter (Signed)
xxxx 

## 2012-01-10 ENCOUNTER — Other Ambulatory Visit (HOSPITAL_BASED_OUTPATIENT_CLINIC_OR_DEPARTMENT_OTHER): Payer: BC Managed Care – PPO

## 2012-01-10 ENCOUNTER — Telehealth: Payer: Self-pay | Admitting: Oncology

## 2012-01-10 ENCOUNTER — Ambulatory Visit (HOSPITAL_BASED_OUTPATIENT_CLINIC_OR_DEPARTMENT_OTHER): Payer: BC Managed Care – PPO | Admitting: Oncology

## 2012-01-10 VITALS — BP 113/67 | HR 74 | Temp 97.0°F | Ht 60.0 in | Wt 120.6 lb

## 2012-01-10 DIAGNOSIS — C139 Malignant neoplasm of hypopharynx, unspecified: Secondary | ICD-10-CM

## 2012-01-10 LAB — COMPREHENSIVE METABOLIC PANEL
ALT: 12 U/L (ref 0–35)
Albumin: 3.9 g/dL (ref 3.5–5.2)
CO2: 29 mEq/L (ref 19–32)
Calcium: 9.7 mg/dL (ref 8.4–10.5)
Chloride: 101 mEq/L (ref 96–112)
Glucose, Bld: 102 mg/dL — ABNORMAL HIGH (ref 70–99)
Sodium: 138 mEq/L (ref 135–145)
Total Bilirubin: 0.3 mg/dL (ref 0.3–1.2)
Total Protein: 6 g/dL (ref 6.0–8.3)

## 2012-01-10 LAB — CBC WITH DIFFERENTIAL/PLATELET
Eosinophils Absolute: 0.1 10*3/uL (ref 0.0–0.5)
HCT: 31.3 % — ABNORMAL LOW (ref 34.8–46.6)
LYMPH%: 9.8 % — ABNORMAL LOW (ref 14.0–49.7)
MONO#: 0.4 10*3/uL (ref 0.1–0.9)
NEUT#: 2.4 10*3/uL (ref 1.5–6.5)
Platelets: 289 10*3/uL (ref 145–400)
RBC: 3.33 10*6/uL — ABNORMAL LOW (ref 3.70–5.45)
WBC: 3.3 10*3/uL — ABNORMAL LOW (ref 3.9–10.3)
lymph#: 0.3 10*3/uL — ABNORMAL LOW (ref 0.9–3.3)

## 2012-01-10 NOTE — Telephone Encounter (Signed)
Gv pt appt for [id number].  scheduled pt for Pet scan on 05/02 @ [facility].

## 2012-01-10 NOTE — Progress Notes (Signed)
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   CURRENT THERAPY: concurrent q3wk Cisplatin started on 10/28/2011 and daily radiation.   INTERVAL HISTORY:  Sherry Craig 66 y.o. female returns for a follow up visit.   Her mucositis pain is better controlled with medications.Rad Onc recently increased her Fentanyl patch from to . She still requires about 3x/day of Lortab elixir. She is still doing swallowing exercise per Speech Path. She had xerotomia and thick phlegm, eventually complicated by sinusitis which was treated with Levaquin with good results ; In addition, she has been  doing mouth rinse with diluted H2O2 regularly as well as with Magic Mouthwash. Gagging has improved.  She is not doing tube feedings. Instead, she is increasing her Ensure to 3 a day by mouth, as well as eating more solid foods, including sausage, beef, eggs, and potatoes. She desires to have tube removed. Her PEG tube is clean without purulent discharge, pain or erythema. Mild leakage, but patient states that is chronic since loosening it up.  She has mild fatigue; however, she is still independent of all activities of daily living. She has began to exercise at her local gym.She denies tinnitus and hearing loss. Her right cervical node swelling has completely resolved with therapy. She still has intermittent hoarse voice.  Patient denies headache, visual changes, confusion, drenching night sweats, jaundice, chest pain, palpitation, shortness of breath, dyspnea on exertion, productive cough, gum bleeding, epistaxis, hematemesis, hemoptysis, abdominal pain, abdominal swelling, early satiety, melena, hematochezia, hematuria, skin rash, spontaneous bleeding, joint swelling, joint pain, heat or cold intolerance, bowel bladder incontinence, back pain, focal motor weakness, paresthesia,  depression, suicidal or homicidal ideation, feeling hopelessness.        MEDICAL HISTORY: Past Medical History  Diagnosis Date  . Hyperlipemia   . Allergy   . Recurrent sinus infections   . 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 Tongue cancer   . GERD (gastroesophageal reflux disease)   . Osteopenia   . 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 Sinus infection     History of  . S/P radiation therapy 10/28/2011 - 12/17/2011    70 Gy to right Hypopharynx, involved lymph nodes, uninvolved lymph nodes  . Status post chemotherapy Started 10/28/2011    Q3Wk Cisplatin concurrent with Radiation Therapy    SURGICAL HISTORY:  Past Surgical History  Procedure Date  . Tubal ligation   . Fine needle aspiration 09/03/11    FNA to right Neck Mass - Squamous Cell Carcinoma of the Right Hypopharynx    MEDICATIONS: Current Outpatient Prescriptions  Medication Sig Dispense Refill  . Alum & Mag Hydroxide-Simeth (MAGIC MOUTHWASH W/LIDOCAINE) SOLN Take 5 mLs by mouth 3 (three) times daily as needed.        Marland Kitchen atorvastatin (LIPITOR) 20 MG tablet Take 20 mg by mouth daily.        Marland Kitchen azelastine (ASTELIN) 137 MCG/SPRAY nasal spray Place 2 sprays into the nose 2 (two) times daily. Use in each nostril as directed      . calcium carbonate (OS-CAL) 1250 MG chewable tablet Chew 1 tablet by mouth daily.       . chlorpheniramine (CHLOR-TRIMETON) 2 MG/5ML syrup Take 2 mg by mouth at bedtime as needed. Prn cough       . Cholecalciferol (VITAMIN D) 2000 UNITS CAPS Take 2,000 Units by  mouth daily.        Marland Kitchen emollient (BIAFINE) cream Apply topically as needed.  454 g  0  . Ensure Plus (ENSURE PLUS) LIQD Begin one can of Ensure Plus via feeding tube TID with 90 cc free water before and after each bolus feeding at 1000, 1500, and 2000 daily as tolerated.  711 mL  0  . fentaNYL (DURAGESIC - DOSED MCG/HR) 50 MCG/HR Place 1 patch (50 mcg total) onto the skin every 3 (three) days.  10 patch   0  . fexofenadine-pseudoephedrine (ALLEGRA-D 24) 180-240 MG per 24 hr tablet Take 1 tablet by mouth daily as needed. For allergies      . fluticasone (FLONASE) 50 MCG/ACT nasal spray Place 2 sprays into the nose daily as needed. For allergies      . ibuprofen (ADVIL,MOTRIN) 800 MG tablet       . levofloxacin (LEVAQUIN) 500 MG tablet Take 500 mg by mouth daily. Dr. Elias Else      . LORazepam (ATIVAN) 0.5 MG tablet Take 0.5 mg by mouth every 6 (six) hours as needed. For anxiety or nausea       . omeprazole (PRILOSEC) 40 MG capsule Take 40 mg by mouth daily.        . ondansetron (ZOFRAN) 8 MG tablet Take 1 tablet two times a day as needed for nausea or vomiting starting on the third day after chemotherapy.  30 tablet  3  . Polyethyl Glycol-Propyl Glycol (SYSTANE OP) Place 1 drop into both eyes 3 (three) times daily as needed. For allergies       . prochlorperazine (COMPAZINE) 10 MG tablet Take 1 tablet (10 mg total) by mouth every 6 (six) hours as needed (Nausea or vomiting).  30 tablet  3    ALLERGIES:   has no known allergies.  REVIEW OF SYSTEMS:  The rest of the 14-point review of system was negative.   Filed Vitals:   01/10/12 1020  BP: 113/67  Pulse: 74  Temp: 97 F (36.1 C)   Wt Readings from Last 3 Encounters:  01/10/12 120 lb 9.6 oz (54.704 kg)  12/16/11 128 lb 4.8 oz (58.196 kg)  12/14/11 125 lb 6.4 oz (56.881 kg)     PHYSICAL EXAMINATION:  Gen: Well-nourished, in no acute distress. Eyes: No scleral icterus or jaundice. ENT: There was no oropharyngeal lesions on my unaided exam. Neck was supple without thyromegaly. R>L radiation changes. Lymphatics: complete resolution of right cervical node. There was no supraclavicular, axillary, or inguinal adenopathy. Respiratory: Lungs were clear the left without wheezing or crackles. Cardiovascular: Heart rate and rhythm; S1/S2; without murmur, rubs, or gallop. There was no pedal edema. GI: Abdomen was soft, flat, nontender,  nondistended, without organomegaly. PEG tube in place without erythema/discharge/pain. Musculoskeletal exam: No spinal tenderness on palpation of vertebral spine. Skin exam was without ecchymosis, petechiae. Neuro exam was nonfocal. Patient was able to get on and off exam table without assistance. Gait was normal. Patient was alert and oriented. Attention was good. Language was appropriate. Mood was normal without depression. Speech was not pressured. Thought content was not tangential     LABORATORY/RADIOLOGY DATA:   Lab 01/10/12 0955  WBC 3.3*  HGB 10.6*  HCT 31.3*  PLT 289  MCV 94.0  MCH 32.0  MCHC 34.0  RDW 18.1*  LYMPHSABS 0.3*  MONOABS 0.4  EOSABS 0.1  BASOSABS 0.0  BANDABS --    CMP   No results found for this basename: NA:5,K:5,CL:5,CO2:5,GLUCOSE:5,BUN:5,CREATININE:5,GFRCGP,:5,CALCIUM:5,MG:5,AST:5,ALT:5,ALKPHOS:5,BILITOT:5 in the  last 168 hours      Component Value Date/Time   BILITOT 0.3 12/07/2011 1610     Radiology Studies:  No results found.     ASSESSMENT AND PLAN:   1.Hypopharyngeal SCC: S/p 2 of 3 cycles of chemotherapy with Cisplatin. Her ANC today is 1.4 Her last dosewas reduced by 25% given recent neutropenia.  2. Mucositis:Improved  with Fentanyl 63mcg/hr transdermal changed q72 hoursand Lortab elixir up to 4 x/day to help her resuming her oral intake and increase fluid intake to about 2L daily to decrease risk of renal insufficiency.She is tolerating  diluted H2O2 (1:4 ratio) to thin out her thick phlegm as well as Magic Mouthwash prn.  3. Moderate calorie/protein malnutrition:She is not using her PEG tube. She has been consuming more solid foods Her weight has decreased however. 4. Nausea/vomiting prophylaxis: she has Compazine/Zofran/Ativan prn.  5. Hyperlipidemia: She's on Lipitor per PCP.  6.Sinusitis: resolved with Levaquin. Followed by her Primary Physiscian 7. F/U: with me in about 3 weeks to ensure that she is recovering appropriately.

## 2012-01-18 ENCOUNTER — Ambulatory Visit: Payer: BC Managed Care – PPO | Attending: Oncology

## 2012-01-18 DIAGNOSIS — C029 Malignant neoplasm of tongue, unspecified: Secondary | ICD-10-CM | POA: Insufficient documentation

## 2012-01-18 DIAGNOSIS — IMO0001 Reserved for inherently not codable concepts without codable children: Secondary | ICD-10-CM | POA: Insufficient documentation

## 2012-01-24 ENCOUNTER — Encounter (HOSPITAL_COMMUNITY): Payer: Self-pay | Admitting: Dentistry

## 2012-01-24 ENCOUNTER — Ambulatory Visit (HOSPITAL_COMMUNITY): Payer: Medicaid - Dental | Admitting: Dentistry

## 2012-01-24 VITALS — BP 118/75 | HR 90 | Temp 97.1°F

## 2012-01-24 DIAGNOSIS — C139 Malignant neoplasm of hypopharynx, unspecified: Secondary | ICD-10-CM

## 2012-01-24 DIAGNOSIS — R432 Parageusia: Secondary | ICD-10-CM

## 2012-01-24 DIAGNOSIS — Z09 Encounter for follow-up examination after completed treatment for conditions other than malignant neoplasm: Secondary | ICD-10-CM

## 2012-01-24 NOTE — Progress Notes (Signed)
Monday, January 24, 2012   BP:118/75                    P: 90             T: 97.1            Wgt: 120 lbs today and that has been stable. Patient was 138 lbs at start of treatment.  CBC    Component Value Date/Time   WBC 3.3* 01/10/2012 0955   WBC 6.5 10/15/2011 1100   RBC 3.33* 01/10/2012 0955   RBC 4.34 10/15/2011 1100   HGB 10.6* 01/10/2012 0955   HGB 13.0 10/15/2011 1100   HCT 31.3* 01/10/2012 0955   HCT 40.4 10/15/2011 1100   PLT 289 01/10/2012 0955   PLT 267 10/15/2011 1100   MCV 94.0 01/10/2012 0955   MCV 93.1 10/15/2011 1100   MCH 32.0 01/10/2012 0955   MCH 30.0 10/15/2011 1100   MCHC 34.0 01/10/2012 0955   MCHC 32.2 10/15/2011 1100   RDW 18.1* 01/10/2012 0955   RDW 13.3 10/15/2011 1100   LYMPHSABS 0.3* 01/10/2012 0955   MONOABS 0.4 01/10/2012 0955   EOSABS 0.1 01/10/2012 0955   BASOSABS 0.0 01/10/2012 0955   BMET    Component Value Date/Time   NA 138 01/10/2012 0955   K 3.8 01/10/2012 0955   CL 101 01/10/2012 0955   CO2 29 01/10/2012 0955   GLUCOSE 102* 01/10/2012 0955   BUN 10 01/10/2012 0955   CREATININE 0.73 01/10/2012 0955   CALCIUM 9.7 01/10/2012 0955   Patient Active Problem List  Diagnoses  . Cancer of base of tongue  . Cancer of hypopharynx   Current Outpatient Prescriptions  Medication Sig Dispense Refill  . Alum & Mag Hydroxide-Simeth (MAGIC MOUTHWASH W/LIDOCAINE) SOLN Take 5 mLs by mouth 3 (three) times daily as needed.        Marland Kitchen atorvastatin (LIPITOR) 20 MG tablet Take 20 mg by mouth daily.        Marland Kitchen azelastine (ASTELIN) 137 MCG/SPRAY nasal spray Place 2 sprays into the nose 2 (two) times daily. Use in each nostril as directed      . calcium carbonate (OS-CAL) 1250 MG chewable tablet Chew 1 tablet by mouth daily.       . chlorpheniramine (CHLOR-TRIMETON) 2 MG/5ML syrup Take 2 mg by mouth at bedtime as needed. Prn cough       . Cholecalciferol (VITAMIN D) 2000 UNITS CAPS Take 2,000 Units by mouth daily.        Marland Kitchen emollient (BIAFINE) cream Apply topically as needed.   454 g  0  . Ensure Plus (ENSURE PLUS) LIQD Begin one can of Ensure Plus via feeding tube TID with 90 cc free water before and after each bolus feeding at 1000, 1500, and 2000 daily as tolerated.  711 mL  0  . fexofenadine-pseudoephedrine (ALLEGRA-D 24) 180-240 MG per 24 hr tablet Take 1 tablet by mouth daily as needed. For allergies      . fluticasone (FLONASE) 50 MCG/ACT nasal spray Place 2 sprays into the nose daily as needed. For allergies      . ibuprofen (ADVIL,MOTRIN) 800 MG tablet       . levofloxacin (LEVAQUIN) 500 MG tablet Take 500 mg by mouth daily. Dr. Elias Else      . LORazepam (ATIVAN) 0.5 MG tablet Take 0.5 mg by mouth every 6 (six) hours as needed. For anxiety or nausea       . omeprazole (  PRILOSEC) 40 MG capsule Take 40 mg by mouth daily.        . ondansetron (ZOFRAN) 8 MG tablet Take 1 tablet two times a day as needed for nausea or vomiting starting on the third day after chemotherapy.  30 tablet  3  . Polyethyl Glycol-Propyl Glycol (SYSTANE OP) Place 1 drop into both eyes 3 (three) times daily as needed. For allergies       . prochlorperazine (COMPAZINE) 10 MG tablet Take 1 tablet (10 mg total) by mouth every 6 (six) hours as needed (Nausea or vomiting).  30 tablet  3   Sherry Craig is a 66 yo female with H/O SCCA of the Hypopharynx that completed chemoradiation therapy on  12/17/2011.  Patient now presents for a periodic oral examination after radiation therapy.   REVIEW OF CHIEF COMPLAINTS: DRY MOUTH: Yes HARD TO SWALLOW: Yes....secondary to the dryness. HURT TO SWALLOW: No TASTE CHANGES: Still has no taste. SORES IN MOUTH: No TRISMUS SYMPTOMS: No trismus symptoms. Patient is still doing exercises. SYMPTOM RELIEF:  Using 1:4 diluted peroxide and water, and salt water/baking soda rinses, and Biotene.                             Used Tonic water with some relief of viscosity, but made it more dry.  HOME ORAL HYGIENE REGIMEN:  BRUSHING: 2 X a day FLOSSING: 1 X  a day RINSING: see above FLUORIDE: Doing fluoride at night having no problems. TRISMUS EXERCISES: Maximum interincisal opening: 48 mm.   DENTAL EXAM: ORAL HYGIENE(PLAQUE):  Very good oral hygiene. Minimal plaque. Patient is a cleaning last week with Dr. Stoney Bang. LOCATION OF MUCOSITIS: None. DESCRIPTION OF SALIVA: Decreased saliva. Moderate xerostomia. ANY EXPOSED BONE: None noted. OTHER WATCHED AREAS: Previous root canal therapy an area #20 to be followed by Dr. Stoney Bang and the endodontist. DIAGNOSES: 1.  Xerostomia -moderate. 2.  Dysgeusia  3.  Dysphagia   RECOMMENDATIONS: 1. Brush after meals and at bedtime. Use fluoride trays at bedtime. 2. Use trismus exercises as directed. 3. Use Biotene Rinse or salt water/baking soda rinses.  4. Multiple sips of water as needed. 5. Follow up with primary Dentist -Dr. Stoney Bang for routine care and periodontal recall. No significant crown and bridge therapy until after 03/15/12 at the earliest. Call if problems before then.  Dr. Cindra Eves

## 2012-01-26 ENCOUNTER — Other Ambulatory Visit: Payer: Self-pay | Admitting: Radiation Oncology

## 2012-01-26 MED ORDER — FENTANYL 50 MCG/HR TD PT72: 1.0000 | MEDICATED_PATCH | TRANSDERMAL | Status: DC

## 2012-01-27 ENCOUNTER — Ambulatory Visit
Admission: RE | Admit: 2012-01-27 | Payer: BC Managed Care – PPO | Source: Ambulatory Visit | Admitting: Radiation Oncology

## 2012-02-04 ENCOUNTER — Telehealth: Payer: Self-pay | Admitting: Nutrition

## 2012-02-04 NOTE — Telephone Encounter (Signed)
I called patient to follow up on TF and oral intake.  Patient did not answer the phone but I left a message for her to return my call.

## 2012-02-10 ENCOUNTER — Ambulatory Visit: Payer: BC Managed Care – PPO | Admitting: Nutrition

## 2012-02-10 ENCOUNTER — Telehealth: Payer: Self-pay | Admitting: Nutrition

## 2012-02-10 NOTE — Progress Notes (Signed)
I called Sherry Craig on the phone to follow up with her.  She admits that she has lost quite a bit of weight.  Her home scale showed 107 pounds a few days ago which is down from 120 pounds documented February 25th.  The patient reports that she was having difficulty swallowing larger portions of food and it was leading to vomiting and an inability to tolerate her oral diet.  The patient reports that she changed her strategies and started chopping protein foods and eating greens several times a day but has increased milkshakes and Boost High Protein drinks.  She is drinking Boost High Protein 3 times a day and a large milkshake once a day.  Over the last few days, she reports an increase in her weight to 109 pounds on her scale.  She does report that she is hungry all the time and that is helping her to consume increased oral intake.  NUTRITION DIAGNOSIS:  Food and nutrition related knowledge deficit and inadequate oral intake both continue.  INTERVENTION:  I have encouraged Sherry Craig to continue Boost High Protein t.i.d. with a milkshake once a day.  I have suggested she begin to increase her soft moist foods not only at meals but to 3 times a day to add in some additional calories.  She understands her goal is to continue to preserve lean body mass and minimize weight loss.  MONITORING EVALUATION GOALS:  The patient will continue to work to increase her oral intake to preserve lean body mass.  NEXT VISIT:  Friday, May 3rd.    ______________________________ Zenovia Jarred, RD, LDN Clinical Nutrition Specialist BN/MEDQ  D:  02/10/2012  T:  02/10/2012  Job:  [id number]

## 2012-02-14 ENCOUNTER — Other Ambulatory Visit: Payer: Self-pay

## 2012-02-14 ENCOUNTER — Ambulatory Visit: Payer: BC Managed Care – PPO | Attending: Oncology

## 2012-02-14 DIAGNOSIS — C029 Malignant neoplasm of tongue, unspecified: Secondary | ICD-10-CM | POA: Insufficient documentation

## 2012-02-14 DIAGNOSIS — IMO0001 Reserved for inherently not codable concepts without codable children: Secondary | ICD-10-CM | POA: Insufficient documentation

## 2012-02-14 MED ORDER — BENZONATATE 100 MG PO CAPS: 100.0000 mg | ORAL_CAPSULE | Freq: Three times a day (TID) | ORAL | Status: AC | PRN

## 2012-02-14 NOTE — Progress Notes (Signed)
Husband called today c/o of patient having increased cough; called in new Rx for Tessalon Pearl 100mg  capsules, per Dr. Gaylyn Rong; patient aware.

## 2012-02-23 NOTE — Telephone Encounter (Signed)
See documented note in chart

## 2012-03-02 ENCOUNTER — Other Ambulatory Visit: Payer: Self-pay | Admitting: Radiation Oncology

## 2012-03-02 DIAGNOSIS — C139 Malignant neoplasm of hypopharynx, unspecified: Secondary | ICD-10-CM

## 2012-03-02 MED ORDER — FENTANYL 50 MCG/HR TD PT72: 1.0000 | MEDICATED_PATCH | TRANSDERMAL | Status: DC

## 2012-03-03 ENCOUNTER — Other Ambulatory Visit: Payer: Self-pay | Admitting: Radiation Oncology

## 2012-03-03 MED ORDER — FENTANYL 50 MCG/HR TD PT72: 1.0000 | MEDICATED_PATCH | TRANSDERMAL | Status: DC

## 2012-03-03 NOTE — Progress Notes (Signed)
PATIENT CAME BY TO PICK UP RX FOR DURAGESIC 

## 2012-03-10 ENCOUNTER — Ambulatory Visit: Payer: BC Managed Care – PPO

## 2012-03-16 ENCOUNTER — Encounter (HOSPITAL_COMMUNITY)
Admission: RE | Admit: 2012-03-16 | Discharge: 2012-03-16 | Disposition: A | Discharge status: Home / Self Care | Payer: BC Managed Care – PPO | Source: Ambulatory Visit | Attending: Oncology | Admitting: Oncology

## 2012-03-16 DIAGNOSIS — C139 Malignant neoplasm of hypopharynx, unspecified: Secondary | ICD-10-CM

## 2012-03-16 DIAGNOSIS — C029 Malignant neoplasm of tongue, unspecified: Secondary | ICD-10-CM | POA: Insufficient documentation

## 2012-03-16 MED ORDER — FLUDEOXYGLUCOSE F - 18 (FDG) INJECTION
17.7000 | Freq: Once | INTRAVENOUS | Status: AC | PRN
  Administered 2012-03-16: 17.7 via INTRAVENOUS

## 2012-03-16 NOTE — Patient Instructions (Addendum)
A.  PET scan result 03/16/2012:  Findings:  Neck: A focus of hypermetabolism is seen deep to the right  sternocleidomastoid, with an S U V max of 3.5. It appears to  correspond to a 7 mm lymph node (CT image 51). There is fairly  diffuse and symmetric activity throughout the pharynx which may be  physiologic and/or treatment related. Uptake is seen in the  thyroid as well. No additional areas of abnormal hypermetabolism in  the neck. CT images show findings.  Chest: No hypermetabolic mediastinal or hilar nodes. No  suspicious pulmonary nodules on the CT scan. No pericardial or  pleural effusion.  Abdomen/Pelvis: No abnormal hypermetabolic activity within the  liver, pancreas, adrenal glands, or spleen. No hypermetabolic  lymph nodes in the abdomen or pelvis. CT images show no acute  findings.  [place]: No focal hypermetabolic activity to suggest skeletal  metastasis.   IMPRESSION:  1. Residual mild hypermetabolism within a small lymph node deep to  the right sternocleidomastoid.  2. Symmetric pharyngeal activity is presumably physiologic and/or  treatment related.  3. New uptake within the thyroid can be seen with thyroiditis.  B.  Follow up: - See Dr. Annalee Genta (ENT) to see if he wants to remove the neck node now or later.  - Follow up with [place] in about 3 months with CT neck the day before.

## 2012-03-17 ENCOUNTER — Ambulatory Visit: Payer: BC Managed Care – PPO | Admitting: Nutrition

## 2012-03-17 ENCOUNTER — Other Ambulatory Visit (HOSPITAL_BASED_OUTPATIENT_CLINIC_OR_DEPARTMENT_OTHER): Payer: BC Managed Care – PPO

## 2012-03-17 ENCOUNTER — Telehealth: Payer: Self-pay | Admitting: Oncology

## 2012-03-17 ENCOUNTER — Ambulatory Visit (HOSPITAL_BASED_OUTPATIENT_CLINIC_OR_DEPARTMENT_OTHER): Payer: BC Managed Care – PPO | Admitting: Oncology

## 2012-03-17 VITALS — BP 131/66 | HR 72 | Temp 96.9°F | Ht 60.0 in | Wt 108.1 lb

## 2012-03-17 DIAGNOSIS — C139 Malignant neoplasm of hypopharynx, unspecified: Secondary | ICD-10-CM

## 2012-03-17 DIAGNOSIS — K1230 Oral mucositis (ulcerative), unspecified: Secondary | ICD-10-CM

## 2012-03-17 DIAGNOSIS — E785 Hyperlipidemia, unspecified: Secondary | ICD-10-CM

## 2012-03-17 DIAGNOSIS — E440 Moderate protein-calorie malnutrition: Secondary | ICD-10-CM

## 2012-03-17 LAB — CBC WITH DIFFERENTIAL/PLATELET
Basophils Absolute: 0 10*3/uL (ref 0.0–0.1)
EOS%: 4.4 % (ref 0.0–7.0)
Eosinophils Absolute: 0.2 10*3/uL (ref 0.0–0.5)
HCT: 33.2 % — ABNORMAL LOW (ref 34.8–46.6)
HGB: 11 g/dL — ABNORMAL LOW (ref 11.6–15.9)
MCH: 32.2 pg (ref 25.1–34.0)
MONO#: 0.3 10*3/uL (ref 0.1–0.9)
NEUT%: 61.3 % (ref 38.4–76.8)
lymph#: 1.1 10*3/uL (ref 0.9–3.3)

## 2012-03-17 LAB — COMPREHENSIVE METABOLIC PANEL
Albumin: 4.1 g/dL (ref 3.5–5.2)
BUN: 12 mg/dL (ref 6–23)
CO2: 29 mEq/L (ref 19–32)
Calcium: 9.3 mg/dL (ref 8.4–10.5)
Chloride: 104 mEq/L (ref 96–112)
Glucose, Bld: 99 mg/dL (ref 70–99)
Potassium: 4.3 mEq/L (ref 3.5–5.3)

## 2012-03-17 LAB — TSH: TSH: 1.741 u[IU]/mL (ref 0.350–4.500)

## 2012-03-17 NOTE — Progress Notes (Signed)
I spoke with [name] and her daughter today.  She reports that she is tolerating most foods without difficulty.  She has decreased her oral nutrition supplements down to 1 a day and she attributes her low body weight to the fact that she probably needs to add in some Boost Plus or Ensure Plus more often.  Her weight was documented as 108.1 pounds today with a BMI of 21.11 (normal) which is down from 120.6 pounds February 25th.  She still has her feeding tube. However, she is not using it and has not had to use it for about 6 weeks.    The patient does meet criteria for severe malnutrition in the context of chronic illness secondary to a 10% weight loss over 2 months and a depletion of body fat.    NUTRITION DIAGNOSIS:  Food and nutrition related knowledge deficit and inadequate oral intake have improved.  INTERVENTION:  I agree that [name] should continue to consume higher calorie higher protein foods and consume Boost Plus BID.  I have encouraged her to increase her consumption of higher calorie higher protein foods throughout the day.  She feels confident in her knowledge to gain weight to a goal weight of 115 pounds.  MONITORING/EVALUATION/GOALS:  The patient will tolerate oral intake plus 2 Ensure Plus or Boost Plus daily to promote gradual weight gain to a goal weight of 115 pounds per patient's desire.  NEXT VISIT:  There is no followup at this time that is necessary.  The patient will call me with questions or concerns.    ______________________________ Zenovia Jarred, RD, LDN Clinical Nutrition Specialist BN/MEDQ  D:  03/17/2012  T:  03/17/2012  Job:  [id number]

## 2012-03-17 NOTE — Progress Notes (Signed)
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   PAST THERAPY:  concurrent q3wk Cisplatin and daily radiation between 10/28/2011 and 12/21/2011.   CURRENT THERAPY:  Watchful observation.   INTERVAL HISTORY: Sherry Craig 66 y.o. female returns for regular follow up.  She is here with her daughter.  She reported that she has been doing well.  Her voice had previously normalized.  However, she started resuming singing again at church, and this past week her hoarse voice returned.  She could no longer palpate any right cervical neck adenopathy or any mass anywhere else.  She does have thick skin of the neck from radiation but no swelling.  She has been taking PO for the past 6 weeks.  She has not used her PEG since then.  She still has dry mouth and sensitivity to spicy foods; however, she is able to eat pretty much all kinds of foods now.   Patient denies fatigue, headache, visual changes, confusion, drenching night sweats, palpable lymph node swelling, mucositis, odynophagia, dysphagia, nausea vomiting, jaundice, chest pain, palpitation, shortness of breath, dyspnea on exertion, productive cough, gum bleeding, epistaxis, hematemesis, hemoptysis, abdominal pain, abdominal swelling, early satiety, melena, hematochezia, hematuria, skin rash, spontaneous bleeding, joint swelling, joint pain, heat or cold intolerance, bowel bladder incontinence, back pain, focal motor weakness, paresthesia, depression, suicidal or homocidal ideation, feeling hopelessness.   Past Medical History  Diagnosis Date  . Hyperlipemia   . Allergy   . Recurrent sinus infections   . 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 Tongue cancer   . GERD (gastroesophageal reflux disease)   . Osteopenia   . 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 Sinus infection       History of  . S/P radiation therapy 10/28/2011 - 12/17/2011    70 Gy to right Hypopharynx, involved lymph nodes, uninvolved lymph nodes  . Status post chemotherapy Started 10/28/2011    Q3Wk Cisplatin concurrent with Radiation Therapy    Past Surgical History  Procedure Date  . Tubal ligation   . Fine needle aspiration 09/03/11    FNA to right Neck Mass - Squamous Cell Carcinoma of the Right Hypopharynx    Current Outpatient Prescriptions  Medication Sig Dispense Refill  . atorvastatin (LIPITOR) 20 MG tablet Take 20 mg by mouth daily.        Marland Kitchen azelastine (ASTELIN) 137 MCG/SPRAY nasal spray Place 2 sprays into the nose 2 (two) times daily. Use in each nostril as directed      . calcium carbonate (OS-CAL) 1250 MG chewable tablet Chew 1 tablet by mouth daily.       . Cholecalciferol (VITAMIN D) 2000 UNITS CAPS Take 2,000 Units by mouth daily.        . fentaNYL (DURAGESIC - DOSED MCG/HR) 50 MCG/HR Place 1 patch (50 mcg total) onto the skin every 3 (three) days.  10 patch  0  . fexofenadine-pseudoephedrine (ALLEGRA-D 24) 180-240 MG per 24 hr tablet Take 1 tablet by mouth daily as needed. For allergies      . HYDROcodone-acetaminophen (LORTAB) 7.5-500 MG/15ML solution 15 mLs every 6 (six) hours as needed.       Marland Kitchen ibuprofen (ADVIL,MOTRIN) 800 MG tablet       . omeprazole (PRILOSEC) 40 MG capsule Take 40 mg by mouth daily.        Marland Kitchen  Polyethyl Glycol-Propyl Glycol (SYSTANE OP) Place 1 drop into both eyes 3 (three) times daily as needed. For allergies       . Alum & Mag Hydroxide-Simeth (MAGIC MOUTHWASH W/LIDOCAINE) SOLN Take 5 mLs by mouth 3 (three) times daily as needed.        . chlorpheniramine (CHLOR-TRIMETON) 2 MG/5ML syrup Take 2 mg by mouth at bedtime as needed. Prn cough       . emollient (BIAFINE) cream Apply topically as needed.  454 g  0  . Ensure Plus (ENSURE PLUS) LIQD Begin one can of Ensure Plus via feeding tube TID with 90 cc free water before and after each bolus feeding at  1000, 1500, and 2000 daily as tolerated.  711 mL  0  . LORazepam (ATIVAN) 0.5 MG tablet Take 0.5 mg by mouth every 6 (six) hours as needed. For anxiety or nausea       . ondansetron (ZOFRAN) 8 MG tablet Take 1 tablet two times a day as needed for nausea or vomiting starting on the third day after chemotherapy.  30 tablet  3  . prochlorperazine (COMPAZINE) 10 MG tablet Take 1 tablet (10 mg total) by mouth every 6 (six) hours as needed (Nausea or vomiting).  30 tablet  3    ALLERGIES:   has no known allergies.  REVIEW OF SYSTEMS:  The rest of the 14-point review of system was negative.   Filed Vitals:   03/17/12 0838  BP: 131/66  Pulse: 72  Temp: 96.9 F (36.1 C)   Wt Readings from Last 3 Encounters:  03/17/12 108 lb 1.6 oz (49.034 kg)  01/10/12 120 lb 9.6 oz (54.704 kg)  12/16/11 128 lb 4.8 oz (58.196 kg)   ECOG Performance status: 0  PHYSICAL EXAMINATION:   General:  Thin-appearing woman, in no acute distress.  Eyes:  no scleral icterus.  ENT:  There were no oropharyngeal lesions.  Neck was without thyromegaly. There was dark skin and hyperkaratotic changes in the skin of the neck but no edema.   Lymphatics:  Negative cervical, supraclavicular or axillary adenopathy.  I could not palpate the right cervical node.  Respiratory: lungs were clear bilaterally without wheezing or crackles.  Cardiovascular:  Regular rate and rhythm, S1/S2, without murmur, rub or gallop.  There was no pedal edema.  GI:  abdomen was soft, flat, nontender, nondistended, without organomegaly.  PEG was dry/clean/iintact.  Muscoloskeletal:  no spinal tenderness of palpation of vertebral spine.  Skin exam was without echymosis, petichae.  Neuro exam was nonfocal.  Patient was able to get on and off exam table without assistance.  Gait was normal.  Patient was alerted and oriented.  Attention was good.   Language was appropriate.  Mood was normal without depression.  Speech was not pressured.  Thought content was not  tangential.      LABORATORY/RADIOLOGY DATA:  Lab Results  Component Value Date   WBC 4.1 03/17/2012   HGB 11.0* 03/17/2012   HCT 33.2* 03/17/2012   PLT 219 03/17/2012   GLUCOSE 102* 01/10/2012   ALKPHOS 50 01/10/2012   ALT 12 01/10/2012   AST 24 01/10/2012   NA 138 01/10/2012   K 3.8 01/10/2012   CL 101 01/10/2012   CREATININE 0.73 01/10/2012   BUN 10 01/10/2012   CO2 29 01/10/2012   INR 0.96 10/15/2011    RADIOLOGY:  I personally reviewed the following PET scan and showed the images to the patient and her daughter.  In brief, there  was resolution of the uptake in right hypopharynx mass.  There was slight residual uptake in the right cervical node.   Nm Pet Image Restag (ps) Skull Base To Thigh  03/16/2012  *RADIOLOGY REPORT*  Clinical Data: Subsequent treatment strategy for tongue cancer.  NUCLEAR MEDICINE PET SKULL BASE TO THIGH  Fasting Blood Glucose:  85  Technique:  17.7 mCi F-18 FDG was injected intravenously. CT data was obtained and used for attenuation correction and anatomic localization only.  (This was not acquired as a diagnostic CT examination.) Additional exam technical data entered on technologist worksheet.  Comparison:  09/30/2011 and CT neck 07/05/2011.  Findings:  Neck: A focus of hypermetabolism is seen deep to the right sternocleidomastoid, with an S U V max of 3.5.  It appears to correspond to a 7 mm lymph node (CT image 51). There is fairly diffuse and symmetric activity throughout the pharynx which may be physiologic and/or treatment related.  Uptake is seen in the thyroid as well. No additional areas of abnormal hypermetabolism in the neck.  CT images show findings.  Chest:  No hypermetabolic mediastinal or hilar nodes.  No suspicious pulmonary nodules on the CT scan. No pericardial or pleural effusion.  Abdomen/Pelvis:  No abnormal hypermetabolic activity within the liver, pancreas, adrenal glands, or spleen.  No hypermetabolic lymph nodes in the abdomen or pelvis. CT images show  no acute findings.  [place]:  No focal hypermetabolic activity to suggest skeletal metastasis.  IMPRESSION:  1.  Residual mild hypermetabolism within a small lymph node deep to the right sternocleidomastoid. 2.  Symmetric pharyngeal activity is presumably physiologic and/or treatment related. 3.  New uptake within the thyroid can be seen with thyroiditis.  Original Report Authenticated By: Reyes Ivan, M.D.    ASSESSMENT AND PLAN:   1.Hypopharyngeal SCC: S/p 2 of 3 cycles of chemotherapy with Cisplatin.  Her PET scan post treatment showed resolution of the uptake in the primary tumor.  However, there was slight residual activity in the right cervical node.  I strongly urged patient to make an appointment with Dr. Annalee Genta to see if repeat flexible laryngoscopy is appropriate to ensure resolution of the primary disease site with direct visualization.  I defer to Dr. Annalee Genta to decide whether we can observe the right cervical node or proceed with right neck dissection.  In case he decides to observe, I went ahead and ordered a neck CT in about 3 months to ensure no progression of the neck node.   2. Mucositis: improved.  She is on mouth rinse prn.  We are aggressively trying to wean her off of pain meds.   3. Moderate calorie/protein malnutrition: She has been independent of PEG for 6 weeks now.  Her weight is down from prior; however, she is eating all kinds of foods.  She has visit with Vernell Leep today for further advise.  I recommended her to maintain PEG tube until she sees Dr. Annalee Genta to decide if any surgical intervention is needed.    4. Hyperlipidemia: She's on Lipitor per PCP.   5. F/U: in about 3 months with neck CT the day before.

## 2012-03-17 NOTE — Telephone Encounter (Signed)
Gv pt appt for aug2013. scheduled ct scan on 08/02 @ [facility]

## 2012-03-22 ENCOUNTER — Other Ambulatory Visit: Payer: Self-pay | Admitting: Oncology

## 2012-03-22 DIAGNOSIS — C139 Malignant neoplasm of hypopharynx, unspecified: Secondary | ICD-10-CM

## 2012-03-22 NOTE — Progress Notes (Signed)
Received office notes from Dr. David Shoemaker @ [facility]; forwarded to Dr. Ha. 

## 2012-03-23 ENCOUNTER — Telehealth: Payer: Self-pay

## 2012-03-23 NOTE — Telephone Encounter (Signed)
Message copied by Taraya Steward M on Thu Mar 23, 2012  2:46 PM ------      Message from: HA, HUAN T      Created: Wed Mar 22, 2012  3:16 PM       Please call patient and inform her that her ENT Dr Bates did a laryngoscopy which was negative.  If she is not using the PEG tube anymore, I will refer her to IR to remove it within 1 wk.             Thanks.  

## 2012-03-23 NOTE — Telephone Encounter (Signed)
Message copied by Kallie Locks on Thu Mar 23, 2012  2:46 PM ------      Message from: HA, Raliegh Ip T      Created: Wed Mar 22, 2012  3:16 PM       Please call patient and inform her that her ENT Dr Jenne Pane did a laryngoscopy which was negative.  If she is not using the PEG tube anymore, I will refer her to IR to remove it within 1 wk.             Thanks.

## 2012-03-27 ENCOUNTER — Telehealth: Payer: Self-pay | Admitting: *Deleted

## 2012-03-27 ENCOUNTER — Telehealth: Payer: Self-pay | Admitting: Oncology

## 2012-03-27 NOTE — Telephone Encounter (Signed)
Left Vm for pt w/ number to call [facility] Scheduling [phone]) for PEG tube removal and to call Dr. Lodema Pilot nurse back if any questions or problems.  Orders are in computer.

## 2012-03-27 NOTE — Telephone Encounter (Signed)
S/w tina from [facility] and she stated she has called the pt and left message for her to call back regarding the peg tube removal

## 2012-03-28 ENCOUNTER — Telehealth: Payer: Self-pay | Admitting: Oncology

## 2012-03-28 ENCOUNTER — Ambulatory Visit (HOSPITAL_COMMUNITY)
Admission: RE | Admit: 2012-03-28 | Discharge: 2012-03-28 | Disposition: A | Discharge status: Home / Self Care | Payer: BC Managed Care – PPO | Source: Ambulatory Visit | Attending: Oncology | Admitting: Oncology

## 2012-03-28 DIAGNOSIS — C139 Malignant neoplasm of hypopharynx, unspecified: Secondary | ICD-10-CM

## 2012-03-28 DIAGNOSIS — C029 Malignant neoplasm of tongue, unspecified: Secondary | ICD-10-CM | POA: Insufficient documentation

## 2012-03-28 DIAGNOSIS — Z431 Encounter for attention to gastrostomy: Secondary | ICD-10-CM | POA: Insufficient documentation

## 2012-03-28 NOTE — Procedures (Signed)
Successful bedside removal of gastrostomy tube. No immediate complications 

## 2012-03-28 NOTE — Telephone Encounter (Signed)
Following up for Sherry Craig on peg tube removal. S/w tina @ [facility] IR and per tina this has been taken care of and pt is on shcedule.

## 2012-03-29 ENCOUNTER — Ambulatory Visit (HOSPITAL_COMMUNITY): Admission: RE | Admit: 2012-03-29 | Payer: BC Managed Care – PPO | Source: Ambulatory Visit

## 2012-04-03 ENCOUNTER — Other Ambulatory Visit: Payer: Self-pay | Admitting: Radiation Oncology

## 2012-04-03 ENCOUNTER — Telehealth: Payer: Self-pay | Admitting: Radiation Oncology

## 2012-04-03 MED ORDER — FENTANYL 50 MCG/HR TD PT72: 1.0000 | MEDICATED_PATCH | TRANSDERMAL | Status: DC

## 2012-04-03 NOTE — Telephone Encounter (Signed)
Returned patient's call from message left. Patient requested this writer call [phone]. Questioned patient reference need for additional prescription. Patient reports that on the third day of her Fentanyl patch she notices an increase in her cough and pain in her throat when swallowing. Patient afraid to come down to a Fentanyl 25 mcg patch or Lortab because she doesn't think it would be effective. Reported all findings to Dr. Michell Heinrich. Verbalized to patient script was ready for pick up at the nurses station. Encouraged patient to keep 06/29/2012 follow up with Dr. Michell Heinrich. Patient verbalized understanding of all things reviewed.

## 2012-05-10 NOTE — Progress Notes (Signed)
Received office notes from Dr. Annalee Genta @ [facility]; forwarded to Dr. Gaylyn Rong.

## 2012-05-15 ENCOUNTER — Encounter: Payer: Self-pay | Admitting: Internal Medicine

## 2012-05-17 ENCOUNTER — Other Ambulatory Visit (HOSPITAL_COMMUNITY): Payer: Self-pay | Admitting: Otolaryngology

## 2012-05-17 DIAGNOSIS — D491 Neoplasm of unspecified behavior of respiratory system: Secondary | ICD-10-CM

## 2012-05-19 ENCOUNTER — Telehealth: Payer: Self-pay | Admitting: Radiation Oncology

## 2012-05-19 NOTE — Telephone Encounter (Signed)
Returned patient's call. Left message with contact information. Awaiting return call.

## 2012-05-19 NOTE — Telephone Encounter (Signed)
Patient returned previous call at 1330 but, had to leave a message because this writer was away from the desk. Phoned patient back at 1341. Patient requesting Fentanyl patch refill but for 25 mcg instead of 50 mcg. Patient reports that she put her last 50 mcg patch on today. Patient reports "discomfort with swallowing if she doesn't have patch." Patient reports that she was seen by her ENT last week. Patient states,"My ENT saw redness and swelling in my throat." Also, he saw a prominent lymph node and plans to do a PET scan next Tuesday, she said. Phoned Dr. Michell Heinrich in [place] and reviewed this matter. Dr. Mitzi Hansen wrote Fentanyl 25 mcg script. Phoned patient to pick up script. Instructed patient to bring driver's license. Patient verbalized understanding. Routed this message to Dr. Michell Heinrich.

## 2012-05-23 ENCOUNTER — Encounter (HOSPITAL_COMMUNITY)
Admission: RE | Admit: 2012-05-23 | Discharge: 2012-05-23 | Disposition: A | Discharge status: Home / Self Care | Payer: BC Managed Care – PPO | Source: Ambulatory Visit | Attending: Otolaryngology | Admitting: Otolaryngology

## 2012-05-23 DIAGNOSIS — C01 Malignant neoplasm of base of tongue: Secondary | ICD-10-CM | POA: Insufficient documentation

## 2012-05-23 DIAGNOSIS — D491 Neoplasm of unspecified behavior of respiratory system: Secondary | ICD-10-CM

## 2012-05-23 MED ORDER — FLUDEOXYGLUCOSE F - 18 (FDG) INJECTION
15.0000 | Freq: Once | INTRAVENOUS | Status: AC | PRN
  Administered 2012-05-23: 15 via INTRAVENOUS

## 2012-05-24 LAB — GLUCOSE, CAPILLARY: Glucose-Capillary: 79 mg/dL (ref 70–99)

## 2012-06-05 ENCOUNTER — Encounter (HOSPITAL_COMMUNITY): Payer: Self-pay | Admitting: Respiratory Therapy

## 2012-06-06 ENCOUNTER — Other Ambulatory Visit: Payer: Self-pay | Admitting: Otolaryngology

## 2012-06-07 ENCOUNTER — Encounter (HOSPITAL_COMMUNITY)
Admission: RE | Admit: 2012-06-07 | Discharge: 2012-06-07 | Disposition: A | Discharge status: Home / Self Care | Payer: BC Managed Care – PPO | Source: Ambulatory Visit | Attending: Otolaryngology | Admitting: Otolaryngology

## 2012-06-07 ENCOUNTER — Encounter (HOSPITAL_COMMUNITY): Payer: Self-pay

## 2012-06-07 HISTORY — DX: Unspecified osteoarthritis, unspecified site: M19.90

## 2012-06-07 HISTORY — DX: Personal history of other medical treatment: Z92.89

## 2012-06-07 LAB — BASIC METABOLIC PANEL
CO2: 28 mEq/L (ref 19–32)
Chloride: 103 mEq/L (ref 96–112)
Creatinine, Ser: 1.05 mg/dL (ref 0.50–1.10)
Potassium: 3.9 mEq/L (ref 3.5–5.1)

## 2012-06-07 LAB — SURGICAL PCR SCREEN: Staphylococcus aureus: NEGATIVE

## 2012-06-07 LAB — CBC
HCT: 34.4 % — ABNORMAL LOW (ref 36.0–46.0)
Hemoglobin: 11.4 g/dL — ABNORMAL LOW (ref 12.0–15.0)
MCV: 91.7 fL (ref 78.0–100.0)
RDW: 14.3 % (ref 11.5–15.5)
WBC: 6.7 10*3/uL (ref 4.0–10.5)

## 2012-06-07 NOTE — Progress Notes (Signed)
Call to MD office, spoke with Angelica Chessman, she reports Dr. Annalee Genta doesn't sign orders until DOS.  She will ask Dr. Annalee Genta to sign orders today.

## 2012-06-07 NOTE — Pre-Procedure Instructions (Signed)
20 Sherry Craig  06/07/2012   Your procedure is scheduled on:  06/13/2012  Report to [facility] at 5:30 AM.  Call this number if you have problems the morning of surgery: [phone]   Remember:   Do not eat food or drink liquid:After Midnight.      Take these medicines the morning of surgery with A SIP OF WATER: Prilosec   Do not wear jewelry, make-up or nail polish.  Do not wear lotions, powders, or perfumes. You may wear deodorant.  Do not shave 48 hours prior to surgery. Men may shave face and neck.  Do not bring valuables to the hospital.  Contacts, dentures or bridgework may not be worn into surgery.  Leave suitcase in the car. After surgery it may be brought to your room.  For patients admitted to the hospital, checkout time is 11:00 AM the day of discharge.   Patients discharged the day of surgery will not be allowed to drive home.  Name and phone number of your driver: /w spouse  Special Instructions: Special soap shower night before surgery & morning of  surgery  Please read over the following fact sheets that you were given: Pain Booklet, Coughing and Deep Breathing, MRSA Information and Surgical Site Infection Prevention

## 2012-06-07 NOTE — Progress Notes (Signed)
Pt. Doesn't remember when she had an EKG last.

## 2012-06-13 ENCOUNTER — Encounter (HOSPITAL_COMMUNITY): Payer: Self-pay | Admitting: *Deleted

## 2012-06-13 ENCOUNTER — Inpatient Hospital Stay (HOSPITAL_COMMUNITY)
Admission: RE | Admit: 2012-06-13 | Discharge: 2012-06-15 | DRG: 407 | Disposition: A | Discharge status: Home / Self Care | Payer: BC Managed Care – PPO | Source: Ambulatory Visit | Attending: Otolaryngology | Admitting: Otolaryngology

## 2012-06-13 ENCOUNTER — Ambulatory Visit (HOSPITAL_COMMUNITY): Payer: BC Managed Care – PPO | Admitting: Anesthesiology

## 2012-06-13 ENCOUNTER — Encounter (HOSPITAL_COMMUNITY): Payer: Self-pay | Admitting: Otolaryngology

## 2012-06-13 ENCOUNTER — Encounter (HOSPITAL_COMMUNITY): Payer: Self-pay | Admitting: Anesthesiology

## 2012-06-13 ENCOUNTER — Encounter (HOSPITAL_COMMUNITY)
Admission: RE | Disposition: A | Discharge status: Home / Self Care | Payer: Self-pay | Source: Ambulatory Visit | Attending: Otolaryngology

## 2012-06-13 DIAGNOSIS — M129 Arthropathy, unspecified: Secondary | ICD-10-CM | POA: Diagnosis present

## 2012-06-13 DIAGNOSIS — Z87891 Personal history of nicotine dependence: Secondary | ICD-10-CM

## 2012-06-13 DIAGNOSIS — R221 Localized swelling, mass and lump, neck: Secondary | ICD-10-CM

## 2012-06-13 DIAGNOSIS — Z01810 Encounter for preprocedural cardiovascular examination: Secondary | ICD-10-CM

## 2012-06-13 DIAGNOSIS — K219 Gastro-esophageal reflux disease without esophagitis: Secondary | ICD-10-CM | POA: Diagnosis present

## 2012-06-13 DIAGNOSIS — Z01818 Encounter for other preprocedural examination: Secondary | ICD-10-CM

## 2012-06-13 DIAGNOSIS — Z85810 Personal history of malignant neoplasm of tongue: Secondary | ICD-10-CM

## 2012-06-13 DIAGNOSIS — Z01812 Encounter for preprocedural laboratory examination: Secondary | ICD-10-CM

## 2012-06-13 DIAGNOSIS — C50919 Malignant neoplasm of unspecified site of unspecified female breast: Principal | ICD-10-CM | POA: Diagnosis present

## 2012-06-13 DIAGNOSIS — E785 Hyperlipidemia, unspecified: Secondary | ICD-10-CM | POA: Diagnosis present

## 2012-06-13 DIAGNOSIS — Z79899 Other long term (current) drug therapy: Secondary | ICD-10-CM

## 2012-06-13 HISTORY — PX: RADICAL NECK DISSECTION: SHX2284

## 2012-06-13 SURGERY — DISSECTION, NECK, RADICAL
Anesthesia: General | Laterality: Right | Wound class: Clean

## 2012-06-13 MED ORDER — BACITRACIN ZINC 500 UNIT/GM EX OINT
TOPICAL_OINTMENT | CUTANEOUS | Status: DC | PRN
  Administered 2012-06-13: 1 via TOPICAL

## 2012-06-13 MED ORDER — DEXAMETHASONE SODIUM PHOSPHATE 4 MG/ML IJ SOLN
INTRAMUSCULAR | Status: DC | PRN
  Administered 2012-06-13: 4 mg via INTRAVENOUS

## 2012-06-13 MED ORDER — BACITRACIN ZINC 500 UNIT/GM EX OINT
TOPICAL_OINTMENT | CUTANEOUS | Status: AC
  Filled 2012-06-13: qty 15

## 2012-06-13 MED ORDER — HYDROCODONE-ACETAMINOPHEN 7.5-500 MG/15ML PO SOLN
10.0000 mL | ORAL | Status: DC | PRN
  Administered 2012-06-13 – 2012-06-15 (×6): 15 mL via ORAL
  Filled 2012-06-13 (×6): qty 15

## 2012-06-13 MED ORDER — ONDANSETRON HCL 4 MG/2ML IJ SOLN: 4.0000 mg | INTRAMUSCULAR | Status: DC | PRN

## 2012-06-13 MED ORDER — VITAMIN D3 25 MCG (1000 UNIT) PO TABS
2000.0000 [IU] | ORAL_TABLET | Freq: Every day | ORAL | Status: DC
  Administered 2012-06-13 – 2012-06-15 (×3): 2000 [IU] via ORAL
  Filled 2012-06-13 (×3): qty 2

## 2012-06-13 MED ORDER — MUPIROCIN CALCIUM 2 % EX CREA
TOPICAL_CREAM | CUTANEOUS | Status: AC
  Filled 2012-06-13: qty 15

## 2012-06-13 MED ORDER — SUCCINYLCHOLINE CHLORIDE 20 MG/ML IJ SOLN
INTRAMUSCULAR | Status: DC | PRN
  Administered 2012-06-13: 100 mg via INTRAVENOUS

## 2012-06-13 MED ORDER — MEPERIDINE HCL 25 MG/ML IJ SOLN: 6.2500 mg | INTRAMUSCULAR | Status: DC | PRN

## 2012-06-13 MED ORDER — CEFAZOLIN SODIUM 1-5 GM-% IV SOLN
INTRAVENOUS | Status: DC | PRN
  Administered 2012-06-13: 1 g via INTRAVENOUS

## 2012-06-13 MED ORDER — PROPOFOL 10 MG/ML IV EMUL
INTRAVENOUS | Status: DC | PRN
  Administered 2012-06-13: 150 mg via INTRAVENOUS
  Administered 2012-06-13: 50 mg via INTRAVENOUS

## 2012-06-13 MED ORDER — ONDANSETRON HCL 4 MG PO TABS: 4.0000 mg | ORAL_TABLET | ORAL | Status: DC | PRN

## 2012-06-13 MED ORDER — CEFAZOLIN SODIUM 1-5 GM-% IV SOLN
1.0000 g | Freq: Three times a day (TID) | INTRAVENOUS | Status: AC
  Administered 2012-06-13 – 2012-06-14 (×3): 1 g via INTRAVENOUS
  Filled 2012-06-13 (×3): qty 50

## 2012-06-13 MED ORDER — PANTOPRAZOLE SODIUM 40 MG PO TBEC
40.0000 mg | DELAYED_RELEASE_TABLET | Freq: Every day | ORAL | Status: DC
  Administered 2012-06-13 – 2012-06-15 (×3): 40 mg via ORAL
  Filled 2012-06-13 (×3): qty 1

## 2012-06-13 MED ORDER — VITAMIN D 50 MCG (2000 UT) PO CAPS: 2000.0000 [IU] | ORAL_CAPSULE | Freq: Every day | ORAL | Status: DC

## 2012-06-13 MED ORDER — POLYVINYL ALCOHOL 1.4 % OP SOLN: 1.0000 [drp] | Freq: Three times a day (TID) | OPHTHALMIC | Status: DC | PRN

## 2012-06-13 MED ORDER — HYDROMORPHONE HCL PF 1 MG/ML IJ SOLN
INTRAMUSCULAR | Status: AC
  Filled 2012-06-13: qty 1

## 2012-06-13 MED ORDER — LIDOCAINE-EPINEPHRINE 1 %-1:100000 IJ SOLN
INTRAMUSCULAR | Status: AC
  Filled 2012-06-13: qty 1

## 2012-06-13 MED ORDER — CEFAZOLIN SODIUM 1-5 GM-% IV SOLN
INTRAVENOUS | Status: AC
  Filled 2012-06-13: qty 50

## 2012-06-13 MED ORDER — FENTANYL CITRATE 0.05 MG/ML IJ SOLN
INTRAMUSCULAR | Status: DC | PRN
  Administered 2012-06-13 (×2): 50 ug via INTRAVENOUS
  Administered 2012-06-13: 100 ug via INTRAVENOUS
  Administered 2012-06-13: 50 ug via INTRAVENOUS

## 2012-06-13 MED ORDER — ONDANSETRON HCL 4 MG/2ML IJ SOLN
INTRAMUSCULAR | Status: DC | PRN
  Administered 2012-06-13: 4 mg via INTRAVENOUS

## 2012-06-13 MED ORDER — ARTIFICIAL TEARS OP OINT
TOPICAL_OINTMENT | OPHTHALMIC | Status: DC | PRN
  Administered 2012-06-13: 1 via OPHTHALMIC

## 2012-06-13 MED ORDER — DEXTROSE-NACL 5-0.45 % IV SOLN
INTRAVENOUS | Status: DC
Start: 2012-06-13 — End: 2012-06-15
  Administered 2012-06-13: 1000 mL via INTRAVENOUS
  Administered 2012-06-14: 05:00:00 via INTRAVENOUS

## 2012-06-13 MED ORDER — HYDROMORPHONE HCL PF 1 MG/ML IJ SOLN
0.2500 mg | INTRAMUSCULAR | Status: DC | PRN
  Administered 2012-06-13 (×2): 0.5 mg via INTRAVENOUS

## 2012-06-13 MED ORDER — 0.9 % SODIUM CHLORIDE (POUR BTL) OPTIME
TOPICAL | Status: DC | PRN
  Administered 2012-06-13: 1000 mL

## 2012-06-13 MED ORDER — LIDOCAINE HCL 4 % MT SOLN
OROMUCOSAL | Status: DC | PRN
  Administered 2012-06-13: 4 mL via TOPICAL

## 2012-06-13 MED ORDER — ATORVASTATIN CALCIUM 20 MG PO TABS
20.0000 mg | ORAL_TABLET | Freq: Every evening | ORAL | Status: DC
  Administered 2012-06-13 – 2012-06-14 (×2): 20 mg via ORAL
  Filled 2012-06-13 (×3): qty 1

## 2012-06-13 MED ORDER — PROMETHAZINE HCL 25 MG/ML IJ SOLN: 6.2500 mg | INTRAMUSCULAR | Status: DC | PRN

## 2012-06-13 MED ORDER — POLYETHYL GLYCOL-PROPYL GLYCOL 0.4-0.3 % OP SOLN: 1.0000 [drp] | Freq: Three times a day (TID) | OPHTHALMIC | Status: DC

## 2012-06-13 MED ORDER — LIDOCAINE HCL 1 % IJ SOLN
INTRAMUSCULAR | Status: DC | PRN
  Administered 2012-06-13: 80 mg via INTRADERMAL

## 2012-06-13 MED ORDER — FENTANYL 25 MCG/HR TD PT72
25.0000 ug | MEDICATED_PATCH | TRANSDERMAL | Status: DC
  Administered 2012-06-13: 25 ug via TRANSDERMAL
  Filled 2012-06-13: qty 1

## 2012-06-13 MED ORDER — CALCIUM CARBONATE 1250 (500 CA) MG PO TABS
1250.0000 mg | ORAL_TABLET | Freq: Every day | ORAL | Status: DC
  Administered 2012-06-13 – 2012-06-15 (×3): 1250 mg via ORAL
  Filled 2012-06-13 (×3): qty 1

## 2012-06-13 MED ORDER — CALCIUM CARBONATE 1250 (500 CA) MG PO CHEW: 1250.0000 mg | CHEWABLE_TABLET | Freq: Every day | ORAL | Status: DC

## 2012-06-13 MED ORDER — MIDAZOLAM HCL 5 MG/5ML IJ SOLN
INTRAMUSCULAR | Status: DC | PRN
  Administered 2012-06-13: 2 mg via INTRAVENOUS

## 2012-06-13 MED ORDER — PHENYLEPHRINE HCL 10 MG/ML IJ SOLN
10.0000 mg | INTRAVENOUS | Status: DC | PRN
  Administered 2012-06-13: 10 ug/min via INTRAVENOUS
  Administered 2012-06-13: 15 ug/min via INTRAVENOUS

## 2012-06-13 MED ORDER — PHENYLEPHRINE HCL 10 MG/ML IJ SOLN
INTRAMUSCULAR | Status: DC | PRN
  Administered 2012-06-13 (×5): 80 ug via INTRAVENOUS

## 2012-06-13 MED ORDER — BACITRACIN ZINC 500 UNIT/GM EX OINT
1.0000 "application " | TOPICAL_OINTMENT | Freq: Three times a day (TID) | CUTANEOUS | Status: DC
  Administered 2012-06-13 – 2012-06-15 (×7): 1 via TOPICAL
  Filled 2012-06-13: qty 15

## 2012-06-13 MED ORDER — LACTATED RINGERS IV SOLN
INTRAVENOUS | Status: DC | PRN
  Administered 2012-06-13 (×2): via INTRAVENOUS

## 2012-06-13 MED ORDER — MORPHINE SULFATE 2 MG/ML IJ SOLN
2.0000 mg | INTRAMUSCULAR | Status: DC | PRN
  Administered 2012-06-13 (×2): 2 mg via INTRAVENOUS
  Filled 2012-06-13: qty 1
  Filled 2012-06-13: qty 2

## 2012-06-13 MED ORDER — LIDOCAINE-EPINEPHRINE 1 %-1:100000 IJ SOLN
INTRAMUSCULAR | Status: DC | PRN
  Administered 2012-06-13: 5 mL

## 2012-06-13 SURGICAL SUPPLY — 53 items
ATTRACTOMAT 16X20 MAGNETIC DRP (DRAPES) IMPLANT
BLADE SURG 15 STRL LF DISP TIS (BLADE) IMPLANT
BLADE SURG 15 STRL SS (BLADE) ×2
CANISTER SUCTION 2500CC (MISCELLANEOUS) ×2 IMPLANT
CLEANER TIP ELECTROSURG 2X2 (MISCELLANEOUS) ×2 IMPLANT
CLOTH BEACON ORANGE TIMEOUT ST (SAFETY) ×2 IMPLANT
CORDS BIPOLAR (ELECTRODE) ×2 IMPLANT
COVER SURGICAL LIGHT HANDLE (MISCELLANEOUS) ×2 IMPLANT
DECANTER SPIKE VIAL GLASS SM (MISCELLANEOUS) ×1 IMPLANT
DRAIN CHANNEL 10F 3/8 F FF (DRAIN) ×1 IMPLANT
DRAIN CHANNEL 15F RND FF W/TCR (WOUND CARE) IMPLANT
DRAIN HEMOVAC 1/8 X 5 (WOUND CARE) IMPLANT
ELECT COATED BLADE 2.86 ST (ELECTRODE) ×2 IMPLANT
ELECT REM PT RETURN 9FT ADLT (ELECTROSURGICAL) ×2
ELECTRODE REM PT RTRN 9FT ADLT (ELECTROSURGICAL) ×1 IMPLANT
EVACUATOR SILICONE 100CC (DRAIN) ×2 IMPLANT
GAUZE SPONGE 4X4 16PLY XRAY LF (GAUZE/BANDAGES/DRESSINGS) ×1 IMPLANT
GLOVE BIOGEL M 7.0 STRL (GLOVE) ×4 IMPLANT
GLOVE BIOGEL PI IND STRL 7.0 (GLOVE) IMPLANT
GLOVE BIOGEL PI INDICATOR 7.0 (GLOVE) ×3
GLOVE ECLIPSE 7.5 STRL STRAW (GLOVE) ×1 IMPLANT
GLOVE SURG SS PI 7.0 STRL IVOR (GLOVE) ×2 IMPLANT
GOWN STRL NON-REIN LRG LVL3 (GOWN DISPOSABLE) ×5 IMPLANT
KIT BASIN OR (CUSTOM PROCEDURE TRAY) ×2 IMPLANT
KIT ROOM TURNOVER OR (KITS) ×2 IMPLANT
LOCATOR NERVE 3 VOLT (DISPOSABLE) IMPLANT
NS IRRIG 1000ML POUR BTL (IV SOLUTION) ×2 IMPLANT
PAD ARMBOARD 7.5X6 YLW CONV (MISCELLANEOUS) ×4 IMPLANT
PENCIL BUTTON HOLSTER BLD 10FT (ELECTRODE) ×2 IMPLANT
SPECIMEN JAR MEDIUM (MISCELLANEOUS) ×1 IMPLANT
SPONGE INTESTINAL PEANUT (DISPOSABLE) IMPLANT
SPONGE LAP 18X18 X RAY DECT (DISPOSABLE) ×2 IMPLANT
STAPLER VISISTAT 35W (STAPLE) ×2 IMPLANT
SUT CHROMIC 3 0 SH 27 (SUTURE) ×6 IMPLANT
SUT CHROMIC 5 0 P 3 (SUTURE) IMPLANT
SUT ETHILON 2 0 FS 18 (SUTURE) ×1 IMPLANT
SUT ETHILON 5 0 PS 2 18 (SUTURE) IMPLANT
SUT SILK 2 0 (SUTURE) ×2
SUT SILK 2 0 REEL (SUTURE) ×1 IMPLANT
SUT SILK 2 0 SH CR/8 (SUTURE) ×2 IMPLANT
SUT SILK 2-0 18XBRD TIE 12 (SUTURE) ×1 IMPLANT
SUT SILK 3 0 REEL (SUTURE) ×2 IMPLANT
SUT SILK 4 0 (SUTURE) ×2
SUT SILK 4 0 REEL (SUTURE) ×1 IMPLANT
SUT SILK 4-0 18XBRD TIE 12 (SUTURE) ×2 IMPLANT
SUT VIC AB 3-0 FS2 27 (SUTURE) IMPLANT
SUT VIC AB 3-0 SH 18 (SUTURE) ×1 IMPLANT
TOWEL OR 17X24 6PK STRL BLUE (TOWEL DISPOSABLE) ×2 IMPLANT
TOWEL OR 17X26 10 PK STRL BLUE (TOWEL DISPOSABLE) ×2 IMPLANT
TRAY ENT MC OR (CUSTOM PROCEDURE TRAY) ×2 IMPLANT
TRAY FOLEY CATH 14FRSI W/METER (CATHETERS) IMPLANT
TUBE FEEDING 10FR FLEXIFLO (MISCELLANEOUS) IMPLANT
WATER STERILE IRR 1000ML POUR (IV SOLUTION) ×1 IMPLANT

## 2012-06-13 NOTE — Care Management Note (Signed)
  Page 1 of 1   06/13/2012     1:50:12 PM   CARE MANAGEMENT NOTE 06/13/2012  Patient:  Sherry Craig,Sherry Craig   Account Number:  [id number]  Date Initiated:  06/13/2012  Documentation initiated by:  Ronny Flurry  Subjective/Objective Assessment:   DIAGNOSIS:  laryngeal neoplasm    PROCEDURE:  Procedure(s) (LRB):  RADICAL NECK DISSECTION (Right) (Levels II-V)     Action/Plan:   Anticipated DC Date:  06/16/2012   Anticipated DC Plan:  HOME/SELF CARE         Choice offered to / List presented to:             Status of service:  In process, will continue to follow Medicare Important Message given?   (If response is "NO", the following Medicare IM given date fields will be blank) Date Medicare IM given:   Date Additional Medicare IM given:    Discharge Disposition:    Per UR Regulation:  Reviewed for med. necessity/level of care/duration of stay  If discussed at Long Length of Stay Meetings, dates discussed:    Comments:

## 2012-06-13 NOTE — Preoperative (Signed)
Beta Blockers   Reason not to administer Beta Blockers:Not Applicable 

## 2012-06-13 NOTE — Anesthesia Procedure Notes (Signed)
Procedure Name: Intubation Date/Time: 06/13/2012 7:43 AM Performed by: Leona Singleton A Pre-anesthesia Checklist: Patient identified Patient Re-evaluated:Patient Re-evaluated prior to inductionOxygen Delivery Method: Circle system utilized Preoxygenation: Pre-oxygenation with 100% oxygen Intubation Type: IV induction Ventilation: Mask ventilation without difficulty Laryngoscope Size: Miller and 2 Grade View: Grade II Tube type: Oral Tube size: 7.0 mm Number of attempts: 1 Airway Equipment and Method: Stylet and LTA kit utilized Placement Confirmation: ETT inserted through vocal cords under direct vision and breath sounds checked- equal and bilateral Secured at: 21 cm Tube secured with: Tape Dental Injury: Teeth and Oropharynx as per pre-operative assessment

## 2012-06-13 NOTE — Anesthesia Postprocedure Evaluation (Signed)
  Anesthesia Post-op Note  Patient: Sherry Craig  Procedure(s) Performed: Procedure(s) (LRB): RADICAL NECK DISSECTION (Right)  Patient Location: PACU  Anesthesia Type: General  Level of Consciousness: awake  Airway and Oxygen Therapy: Patient Spontanous Breathing  Post-op Pain: mild  Post-op Assessment: Post-op Vital signs reviewed  Post-op Vital Signs: stable  Complications: No apparent anesthesia complications

## 2012-06-13 NOTE — Brief Op Note (Signed)
06/13/2012  10:48 AM  PATIENT:  Sherry Craig  66 y.o. female  PRE-OPERATIVE DIAGNOSIS:  laryngeal neoplasm  Right Metastatic Neck mass  POST-OPERATIVE DIAGNOSIS:  laryngeal neoplasm  PROCEDURE:  Procedure(s) (LRB): RADICAL NECK DISSECTION (Right) (Levels II-V)  SURGEON:  Surgeon(s) and Role:    * Osborn Coho, MD - Primary    * Suzanna Obey, MD - Assisting  PHYSICIAN ASSISTANT:   ASSISTANTS: Byers  ANESTHESIA:   general  EBL:  Total I/O In: 1600 [I.V.:1600] Out: 50 [Blood:50]  BLOOD ADMINISTERED:none  DRAINS: (10 mm) Jackson-Pratt drain(s) with closed bulb suction in the Rt Neck   LOCAL MEDICATIONS USED:  LIDOCAINE  and Amount: 5 ml  SPECIMEN:  Source of Specimen:  Rt Radical Neck Dissection (II-V)  DISPOSITION OF SPECIMEN:  PATHOLOGY  COUNTS:  YES  TOURNIQUET:  * No tourniquets in log *  DICTATION: .Other Dictation: Dictation Number [id number]  PLAN OF CARE: Admit to inpatient   PATIENT DISPOSITION:  PACU - hemodynamically stable.   Delay start of Pharmacological VTE agent (>24hrs) due to surgical blood loss or risk of bleeding: yes

## 2012-06-13 NOTE — Progress Notes (Signed)
Day of Surgery  Subjective: Doing very well.  Feels good.  Objective: Vital signs in last 24 hours: Temp:  [97.8 F (36.6 C)-99 F (37.2 C)] 97.8 F (36.6 C) (07/30 1344) Pulse Rate:  [66-88] 69  (07/30 1344) Resp:  [12-27] 20  (07/30 1344) BP: (109-134)/(57-99) 124/64 mmHg (07/30 1344) SpO2:  [99 %-100 %] 100 % (07/30 1344) Last BM Date: 06/13/12  Intake/Output from previous day:   Intake/Output this shift: Total I/O In: 2472 [P.O.:480; I.V.:1992] Out: 710 [Urine:650; Drains:10; Blood:50]  General appearance: alert, cooperative and no distress Neck: right neck incision clean and intact, drain functioning well, strong right shoulder shrug  Lab Results:  No results found for this basename: WBC:2,HGB:2,HCT:2,PLT:2 in the last 72 hours BMET No results found for this basename: NA:2,K:2,CL:2,CO2:2,GLUCOSE:2,BUN:2,CREATININE:2,CALCIUM:2 in the last 72 hours PT/INR No results found for this basename: LABPROT:2,INR:2 in the last 72 hours ABG No results found for this basename: PHART:2,PCO2:2,PO2:2,HCO3:2 in the last 72 hours  Studies/Results: No results found.  Anti-infectives: Anti-infectives     Start     Dose/Rate Route Frequency Ordered Stop   06/13/12 1600   ceFAZolin (ANCEF) IVPB 1 g/50 mL premix        1 g 100 mL/hr over 30 Minutes Intravenous Every 8 hours 06/13/12 1226 06/14/12 1559          Assessment/Plan: s/p Procedure(s) (LRB): RADICAL NECK DISSECTION (Right) Doing well.  Continue drain.  LOS: 0 days    Tiann Saha 06/13/2012

## 2012-06-13 NOTE — Anesthesia Preprocedure Evaluation (Addendum)
Anesthesia Evaluation  Patient identified by MRN, date of birth, ID band Patient awake    Reviewed: Allergy & Precautions, H&P , NPO status , Patient's Chart, lab work & pertinent test results  History of Anesthesia Complications Negative for: history of anesthetic complications  Airway Mallampati: I TM Distance: >3 FB Neck ROM: Full    Dental  (+) Teeth Intact, Caps and Dental Advisory Given   Pulmonary former smoker,  breath sounds clear to auscultation        Cardiovascular negative cardio ROS  Rhythm:Regular Rate:Normal     Neuro/Psych negative neurological ROS     GI/Hepatic Neg liver ROS, GERD-  Medicated and Controlled,Tongue CA/Hypopharynix CA--s/p chemo/rad tx Pt hoarse. No involvement of vocal cords per Dr Annalee Genta. Right neck lymph node involvement.    Endo/Other  negative endocrine ROS  Renal/GU negative Renal ROS     Musculoskeletal negative musculoskeletal ROS (+)   Abdominal   Peds  Hematology negative hematology ROS (+)   Anesthesia Other Findings   Reproductive/Obstetrics negative OB ROS                          Anesthesia Physical Anesthesia Plan  ASA: I  Anesthesia Plan: General   Post-op Pain Management:    Induction: Intravenous  Airway Management Planned: Oral ETT  Additional Equipment:   Intra-op Plan:   Post-operative Plan: Extubation in OR  Informed Consent: I have reviewed the patients History and Physical, chart, labs and discussed the procedure including the risks, benefits and alternatives for the proposed anesthesia with the patient or authorized representative who has indicated his/her understanding and acceptance.   Dental advisory given  Plan Discussed with: CRNA and Surgeon  Anesthesia Plan Comments:         Anesthesia Quick Evaluation

## 2012-06-13 NOTE — Transfer of Care (Signed)
Immediate Anesthesia Transfer of Care Note  Patient: Sherry Craig  Procedure(s) Performed: Procedure(s) (LRB): RADICAL NECK DISSECTION (Right)  Patient Location: PACU  Anesthesia Type: General  Level of Consciousness: awake, alert , oriented and patient cooperative  Airway & Oxygen Therapy: Patient Spontanous Breathing and Patient connected to face mask oxygen  Post-op Assessment: Report given to PACU RN and Post -op Vital signs reviewed and stable  Post vital signs: Reviewed and stable  Complications: No apparent anesthesia complications

## 2012-06-13 NOTE — H&P (Signed)
Sherry Craig is an 66 y.o. female.   Chief Complaint: Rt neck mass HPI: Hx of Rt tongue base SCCa, tx'ed w/ chemorad. Residual rt neck mass. Pos on PET  Past Medical History  Diagnosis Date  . Hyperlipemia   . Allergy   . Recurrent sinus infections   . 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 Tongue cancer   . GERD (gastroesophageal reflux disease)   . Osteopenia   . 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 Sinus infection     History of  . S/P radiation therapy 10/28/2011 - 12/17/2011    70 Gy to right Hypopharynx, involved lymph nodes, uninvolved lymph nodes  . Status post chemotherapy Started 10/28/2011    Q3Wk Cisplatin concurrent with Radiation Therapy  . H/O exercise stress test     done  >15 yrs. ago due to abnormal EKG.  Stress test was wnl, no need for f/u   . Arthritis     hands & feet     Past Surgical History  Procedure Date  . Tubal ligation   . Fine needle aspiration 09/03/11    FNA to right Neck Mass - Squamous Cell Carcinoma of the Right Hypopharynx  . Refractive surgery     for retinal tears- seen by Dr. Allyne Gee   . Diagnostic laryngoscopy     done by Dr. Annalee Genta- 07/2011- to  be done for diagnosis of CA of hypopharynyx     Family History  Problem Relation Age of Onset  . Coronary artery disease Mother   . Heart disease Mother   . Hypertension Mother   . Heart attack Mother   . Pulmonary embolism Sister   . Cancer Maternal Aunt    Social History:  reports that she quit smoking about 2 years ago. Her smoking use included Cigarettes. She has a 30 pack-year smoking history. She has never used smokeless tobacco. She reports that she drinks about 1.2 ounces of alcohol per week. She reports that she does not use illicit drugs.  Allergies: No Known Allergies  Medications Prior to Admission  Medication Sig Dispense Refill  . atorvastatin (LIPITOR) 20 MG tablet Take 20 mg by mouth daily.       Marland Kitchen azelastine (ASTELIN)  137 MCG/SPRAY nasal spray Place 2 sprays into the nose 2 (two) times daily as needed. Use in each nostril as directed      . calcium carbonate (OS-CAL) 1250 MG chewable tablet Chew 1 tablet by mouth daily.       . Cholecalciferol (VITAMIN D) 2000 UNITS CAPS Take 2,000 Units by mouth daily.       . fentaNYL (DURAGESIC - DOSED MCG/HR) 25 MCG/HR Place 1 patch onto the skin every 3 (three) days. patch      . omeprazole (PRILOSEC) 40 MG capsule Take 40 mg by mouth daily before breakfast.       . Polyethyl Glycol-Propyl Glycol (SYSTANE OP) Place 1 drop into both eyes 3 (three) times daily as needed. For allergies      . fexofenadine-pseudoephedrine (ALLEGRA-D 24) 180-240 MG per 24 hr tablet Take 1 tablet by mouth daily as needed. For allergies        No results found for this or any previous visit (from the past 48 hour(s)). No results found.  Review of Systems  Constitutional: Negative.   HENT: Negative.   Respiratory: Negative.   Cardiovascular: Negative.   Musculoskeletal: Negative.   Neurological: Negative.  Blood pressure 109/65, pulse 66, temperature 98.2 F (36.8 C), resp. rate 18, SpO2 100.00%. Physical Exam  Constitutional: She is oriented to person, place, and time. She appears well-developed and well-nourished.  Neck: Normal range of motion.       Rt level 2cm mass  Cardiovascular: Normal rate and regular rhythm.   Respiratory: Effort normal and breath sounds normal.  GI: Soft.  Musculoskeletal: Normal range of motion.  Lymphadenopathy:    She has cervical adenopathy.  Neurological: She is alert and oriented to person, place, and time.     Assessment/Plan Adm for Rt neck dissection to remove residual tumor. Plan adm for sev days post op.  Alyan Hartline 06/13/2012, 7:33 AM

## 2012-06-14 ENCOUNTER — Inpatient Hospital Stay (HOSPITAL_COMMUNITY): Payer: BC Managed Care – PPO

## 2012-06-14 ENCOUNTER — Encounter (HOSPITAL_COMMUNITY): Payer: Self-pay | Admitting: Otolaryngology

## 2012-06-14 MED ORDER — ENSURE COMPLETE PO LIQD
237.0000 mL | Freq: Two times a day (BID) | ORAL | Status: DC
  Administered 2012-06-14: 237 mL via ORAL

## 2012-06-14 NOTE — Op Note (Signed)
Sherry, Craig NO.:  [id number]  MEDICAL RECORD NO.:  [id number]  LOCATION:  [id number]                        FACILITY:  [facility]  PHYSICIAN:  Sherry Craig, M.D.DATE OF BIRTH:  1946-11-09  DATE OF PROCEDURE:  06/13/2012 DATE OF DISCHARGE:                              OPERATIVE REPORT   PREOPERATIVE DIAGNOSES: 1. Right neck mass. 2. 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 3. 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 4. Status post chemo radiation therapy.  POSTOPERATIVE DIAGNOSES: 1. Right neck mass. 2. 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 3. 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 4. Status post chemo radiation therapy.  INDICATION: 1. Right neck mass. 2. 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 3. 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 4. Status post chemo radiation therapy.  SURGICAL PROCEDURE:  Right radical neck dissection.  ANESTHESIA:  General endotracheal.  SURGEON:  Sherry Scales. Annalee Genta, MD.  ASSISTANT:  Dr. Suzanna Obey.  COMPLICATIONS:  None.  ESTIMATED BLOOD LOSS:  Less than 50 mL.  The patient was transferred from the operating room to the recovery room in stable condition.  BRIEF HISTORY:  The patient is a 66 year old, black female followed in our office over the last year with a history of T2 N1 squamous cell carcinoma involving the right base of tongue.  The patient undergone extensive prior workup and presented initially with a right posterior lateral neck mass.  This was biopsied through an incisional biopsy in the right neck and diagnosed with squamous cell carcinoma.  Further workup including PET scan showed up primary tumor in the right base of tongue.  Given history and findings the patient was treated with primary chemo radiation therapy.  Through the [facility]. She did very well  with her therapy and follow up evaluation revealed residual mass in the right lateral neck.  Followup PET scanning showed continued increased activity in that region.  She was followed over the neck several months and a repeat PET scan was performed, which showed increasing activity in the right lateral neck mass consistent with residual metastatic squamous cell carcinoma.  The primary was free of any activity and felt to be cured by her chemo-radiation therapy.  Given her history, examination, and findings, I recommended that we undertake neck dissection to remove residual carcinoma from the right lateral neck.  The risks and benefits of procedure were discussed in detail with the patient and her family.  She understood and concurred with our plan for surgery which is scheduled on elective basis.  On June 13, 2012 at [facility] with anticipated admission and postoperative intraoperative monitoring.  SURGICAL PROCEDURE:  The patient was brought to the operating room on June 13, 2012 placed in supine position on the operating table.  General endotracheal anesthesia was established without difficulty.  When the patient adequately anesthetized, she was positioned on the operating table, prepped and draped in a sterile fashion.  5 mL of 1% lidocaine 1:100 solution epinephrine was injected in the proposed skin incision along the right lateral neck.  After allowing adequate time for vasoconstriction,  hemostasis.  The patient was prepped draped and prepared for surgery.  An L-shaped incision was created using a number 15 scalpel.  This extended from the mastoid tip inferiorly and across the anterior neck in a preexisting skin crease.  The site of the previous transcervical biopsy was carefully demarcated and an elliptical incision was created surrounding this area.  The overlying skin and underlying tissue was left intact with the surgical specimen and removed as part of the  neck dissection to ensure better removal of any residual cancer with a cutaneous incision created.  This extended through the underlying skin and subcutaneous tissue using Bovie electrocautery.  The platysma muscle was identified and divided with the cautery device and subplatysmal flaps were elevated superiorly and inferiorly exposing the right lateral neck.  The neck dissection was then begun with dissection along the anterior aspect of the sternocleidomastoid muscle, soft tissue, including fiber fatty lymph node containing tissue was then carefully dissected along the anterior aspect of the SCM.  Dissection was then carried superiorly and the posterior belly of digastric muscles identified.  Dissection was then carried along the superior component of the proposed neck dissection identifying the jugular vein, hypoglossal nerve, and spinal accessory nerves at the superior margin of the dissection and dissection was carried out along the digastric muscle, removing soft tissue. Superior including the common facial vein, and preserving the hypoglossal nerve throughout its course.  Anterior dissection along the strap muscles.  The omohyoid was then carried down freeing the anterior soft tissue attachments.  The patient's neck was then carefully inspected.  The area of concern was densely adherent to the mid and posterior aspects of the sternocleidomastoid muscle.  The jugular vein was patent but again intimately involved with the area of tumor and at this point, we opted to perform a modified radical neck dissection.  As dissection of the spinal accessory nerve was carried out along its distal course, the nerve was not immediate contact with the area of metastatic tumor and unfortunately we opted to sacrifice the spinal accessory nerve for oncologic completeness thus converting the patient dissection from a modified radical to a radical neck dissection. Posteriorly, zone 5 of the neck was  then carefully dissected and the lymph nodes and soft tissue were reflected anteriorly.  The contribution to the brachial plexus and phrenic nerve were identified and preserved. The lateral cutaneous branches were resected along with the primary specimen.  Dissection was carried out inferiorly in zone 5 of the neck and the thyrocervical trunk was identified.  Small areas of lymph node containing tissue reflected anteriorly and were resected with a primary neck dissection of specimen.  Returning to the superior aspect of the dissection zone 2A and V were carefully dissected.  The superior component was dissected from the deep musculature and reflected inferiorly.  The superior aspect of the jugular vein and carotid artery were identified.  The vagus nerve was identified.  It was preserved as was hypoglossal nerve.  The superior aspect of the jugular vein was then divided and suture ligated with 2-0 silk suture.  Dissection was then carried out along the vascular sheet preserving the carotid artery and the vagus nerve, and reflecting the jugular vein posteriorly was incorporated with our dissection specimen.  Inferiorly the sternocleidomastoid muscle was divided at its attachments along the clavicle and sternum and the inferior aspect of the jugular vein was identified.  Then carefully divided and suture ligated with 2-0 silk suture.  The specimen was then freed  from the deep aspect of the neck, removed and sent to Pathology for gross microscopic evaluation.  The specimen was marked for the superior, inferior, and anterior aspects of the dissection to allow adequate analysis by the pathologist.  The patient's wound was then irrigated and suctioned.  A 10 mm suction drain was placed at the depth of the dissection and then carried out to separate neck stab incision with and sutured in position with a 3-0 Ethilon suture.  The patient's incision was then closed in multiple layers beginning with  reapproximation of platysma muscle with 3-0 Vicryl suture.  The medial subcutaneous closure with interrupted 3-0 Vicryl and final skin closure with interrupted surgical staples.  The patient's wound was then dressed with bacitracin ointment.  She was awakened from anesthetic, extubated, and then transferred to the operating room to the recovery room in stable condition.  No complications and blood loss was less than 50 mL.          ______________________________ Sherry Craig, M.D.     DLS/MEDQ  D:  16/08/9603  T:  06/14/2012  Job:  [id number]  cc:   Exie Parody, M.D. Lurline Hare, M.D.

## 2012-06-14 NOTE — Progress Notes (Signed)
INITIAL ADULT NUTRITION ASSESSMENT Date: 06/14/2012   Time: 3:51 PM Reason for Assessment: Nutrition Risk - weight loss  INTERVENTION:  Ensure Complete po BID, each supplement provides 350 kcal and 13 grams of protein.   Encouraged pt to drink 2 ensure complete per day or to incorporate a couple of high protein snacks per day once d/c home.    ASSESSMENT: Female 66 y.o.  Dx: Neck mass  Hx:  Past Medical History  Diagnosis Date  . Hyperlipemia   . Allergy   . Recurrent sinus infections   . 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 Tongue cancer   . GERD (gastroesophageal reflux disease)   . Osteopenia   . 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 Sinus infection     History of  . S/P radiation therapy 10/28/2011 - 12/17/2011    70 Gy to right Hypopharynx, involved lymph nodes, uninvolved lymph nodes  . Status post chemotherapy Started 10/28/2011    Q3Wk Cisplatin concurrent with Radiation Therapy  . H/O exercise stress test     done  >15 yrs. ago due to abnormal EKG.  Stress test was wnl, no need for f/u   . Arthritis     hands & feet    Past Surgical History  Procedure Date  . Tubal ligation   . Fine needle aspiration 09/03/11    FNA to right Neck Mass - Squamous Cell Carcinoma of the Right Hypopharynx  . Refractive surgery     for retinal tears- seen by Dr. Allyne Gee   . Diagnostic laryngoscopy     done by Dr. Annalee Genta- 07/2011- to  be done for diagnosis of CA of hypopharynyx   . Radical neck dissection 06/13/2012    Procedure: RADICAL NECK DISSECTION;  Surgeon: Osborn Coho, MD;  Location: [facility] OR;  Service: ENT;  Laterality: Right;  Right Neck Dissection    Related Meds:     . atorvastatin  20 mg Oral QPM  . bacitracin  1 application Topical Q8H  . calcium carbonate  1,250 mg Oral Daily  .  ceFAZolin (ANCEF) IV  1 g Intravenous Q8H  . cholecalciferol  2,000 Units Oral Daily  . fentaNYL  25 mcg Transdermal Q72H  . HYDROmorphone        . pantoprazole  40 mg Oral Q1200   Ht: 5' (152.4 cm)  Wt: 106 lb 14.8 oz (48.5 kg)  Ideal Wt: 45.4 kg % Ideal Wt: 106%  Usual Wt:  Wt Readings from Last 10 Encounters:  06/14/12 106 lb 14.8 oz (48.5 kg)  06/14/12 106 lb 14.8 oz (48.5 kg)  06/07/12 106 lb 14.8 oz (48.5 kg)  03/17/12 108 lb 1.6 oz (49.034 kg)  01/10/12 120 lb 9.6 oz (54.704 kg)  12/16/11 128 lb 4.8 oz (58.196 kg)  12/14/11 125 lb 6.4 oz (56.881 kg)  12/07/11 130 lb 12.8 oz (59.33 kg)  12/01/11 131 lb 11.2 oz (59.739 kg)  11/30/11 131 lb 1.6 oz (59.467 kg)   % Usual Wt: 81%  Body mass index is 20.88 kg/(m^2). WNL  Food/Nutrition Related Hx: Pt with a 19% wt loss x 4 months. [facility] RD notes reviewed. Per notes pt was not able to consistently tolerate her TF of Ensure Plus. Pt also appeared to be non-compliant at times by pureeing foods to put down her tube. Pt was diagnosed with severe malnutrition in the context of her chronic illness in May 2013 by the [address] [facility] [address]. Per pt  her weight has been stable for about 1-2 months. Per pt she is walking about 6 miles per day, takes an exercise class 3 times per week and lifts weights. She has been drinking only about 1 ensure per day. Pt does report that her appetite is good but she cannot eat as much as she used to. Now she consumes only small meals per day.   Labs:  CMP     Component Value Date/Time   NA 140 06/07/2012 1241   K 3.9 06/07/2012 1241   CL 103 06/07/2012 1241   CO2 28 06/07/2012 1241   GLUCOSE 91 06/07/2012 1241   BUN 16 06/07/2012 1241   CREATININE 1.05 06/07/2012 1241   CALCIUM 9.3 06/07/2012 1241   PROT 6.3 03/17/2012 0824   ALBUMIN 4.1 03/17/2012 0824   AST 16 03/17/2012 0824   ALT <8 03/17/2012 0824   ALKPHOS 61 03/17/2012 0824   BILITOT 0.2* 03/17/2012 0824   GFRNONAA 55* 06/07/2012 1241   GFRAA 64* 06/07/2012 1241  CBG (last 3)  No results found for this basename: GLUCAP:3 in the last 72 hours  Intake/Output Summary (Last 24 hours) at 06/14/12 1553 Last data filed  at 06/14/12 1456  Gross per 24 hour  Intake 2497.25 ml  Output     60 ml  Net 2437.25 ml    Last BM: 06/13/12  Diet Order: Dysphagia 3 - Thin Meal completion 100%  Supplements/Tube Feeding: none  IVF:    dextrose 5 % and 0.45% NaCl Last Rate: Stopped (06/14/12 1021)    Estimated Nutritional Needs:   Kcal:  1600-1800 Protein:  72-80 grams Fluid:  >1.6 L/day  NUTRITION DIAGNOSIS: -Increased nutrient needs (NI-5.1).  Status: Ongoing  RELATED TO: increased activity and recent surgery  AS EVIDENCE BY: estimated kcal needs 33-37 kcal/kg  MONITORING/EVALUATION(Goals): Goal: Pt to meet >/= 90% of their estimated nutrition needs. Monitor: PO intake, weight  EDUCATION NEEDS: -Education needs addressed  DOCUMENTATION CODES Per approved criteria  -Not Applicable    Kendell Bane RD, LDN, CNSC [phone] Pager [phone] After Hours Pager  06/14/2012, 3:51 PM

## 2012-06-14 NOTE — Progress Notes (Signed)
   ENT Progress Note: POD #1 s/p Procedure(s): Rt RADICAL NECK DISSECTION   Subjective: No complaints, mild pain  Objective: Vital signs in last 24 hours: Temp:  [97.8 F (36.6 C)-99 F (37.2 C)] 98.2 F (36.8 C) (07/31 0554) Pulse Rate:  [69-88] 71  (07/31 0554) Resp:  [12-27] 18  (07/31 0554) BP: (98-134)/(45-99) 112/64 mmHg (07/31 0554) SpO2:  [98 %-100 %] 100 % (07/31 0554) Weight:  [48.5 kg (106 lb 14.8 oz)] 48.5 kg (106 lb 14.8 oz) (07/31 0730) Weight change:  Last BM Date: 06/13/12  Intake/Output from previous day: 07/30 0701 - 07/31 0700 In: 3773 [P.O.:600; I.V.:3173] Out: 760 [Urine:650; Drains:60; Blood:50] Intake/Output this shift:    Labs:    Studies/Results: Dg Chest 2 View  06/14/2012  *RADIOLOGY REPORT*  Clinical Data: Right cervical lymph node resection.  CHEST - 2 VIEW  Comparison: 06/07/2012  Findings: Postop changes right neck region.  Surgical drain in place.  Stable heart size and vascularity.  No focal airspace process, edema, collapse, consolidation, effusion or pneumothorax.  IMPRESSION: Postop changes right cervical region.  No acute chest process.  Original Report Authenticated By: Judie Petit. Ruel Favors, M.D.     PHYSICAL EXAM: Inc intact, no swelling or erythema JP 60cc   Assessment/Plan: Stable Monitor JP Plan d/c 8/2    Sherry Craig 06/14/2012, 10:04 AM

## 2012-06-15 MED ORDER — FENTANYL 25 MCG/HR TD PT72: 1.0000 | MEDICATED_PATCH | TRANSDERMAL | Status: AC

## 2012-06-15 MED ORDER — HYDROCODONE-ACETAMINOPHEN 7.5-500 MG/15ML PO SOLN: 10.0000 mL | ORAL | Status: AC | PRN

## 2012-06-15 NOTE — Progress Notes (Signed)
   ENT Progress Note: POD #2 s/p Procedure(s): RADICAL NECK DISSECTION   Subjective: Stable, min pain  Objective: Vital signs in last 24 hours: Temp:  [98 F (36.7 C)-98.7 F (37.1 C)] 98 F (36.7 C) (08/01 1320) Pulse Rate:  [58-70] 58  (08/01 1320) Resp:  [18-19] 18  (08/01 1320) BP: (102-116)/(42-66) 110/42 mmHg (08/01 1320) SpO2:  [98 %-100 %] 99 % (08/01 1320) Weight change:  Last BM Date: 06/13/12  Intake/Output from previous day: 07/31 0701 - 08/01 0700 In: 1196.3 [P.O.:720; I.V.:326.3; IV Piggyback:150] Out: 15 [Drains:15] Intake/Output this shift:    Labs: No results found for this basename: WBC:2,HGB:2,HCT:2,PLT:2 in the last 72 hours No results found for this basename: NA:2,K:2,CL:2,CO2:2,GLUCOSE:2,BUN:2,CREATININR:2,CALCIUM:2 in the last 72 hours  Studies/Results: Dg Chest 2 View  06/14/2012  *RADIOLOGY REPORT*  Clinical Data: Right cervical lymph node resection.  CHEST - 2 VIEW  Comparison: 06/07/2012  Findings: Postop changes right neck region.  Surgical drain in place.  Stable heart size and vascularity.  No focal airspace process, edema, collapse, consolidation, effusion or pneumothorax.  IMPRESSION: Postop changes right cervical region.  No acute chest process.  Original Report Authenticated By: Judie Petit. Ruel Favors, M.D.     PHYSICAL EXAM: Inc intact JP to 15cc - removed    Assessment/Plan: Stable and doing well. D/C to home    Sherry Craig 06/15/2012, 1:29 PM

## 2012-06-15 NOTE — Discharge Summary (Signed)
Physician Discharge Summary  Patient ID: Sherry Craig MRN: [id number] DOB/AGE: Aug 20, 1946 66 y.o.  Admit date: 06/13/2012 Discharge date: 06/15/2012  Admission Diagnoses:  Principal Problem:  *Neck mass   Discharge Diagnoses:  Same  Surgeries: Procedure(s): RADICAL NECK DISSECTION on 06/13/2012   Consultants: None  Discharged Condition: Improved  Hospital Course: Sherry Craig is an 66 y.o. female who was admitted 06/13/2012 with a diagnosis of Neck mass and went to the operating room on 06/13/2012 and underwent the above named procedures.   Pt stable post op, tol po, no fever, infection or wound concerns.  Recent vital signs:  Filed Vitals:   06/15/12 1320  BP: 110/42  Pulse: 58  Temp: 98 F (36.7 C)  Resp: 18    Recent laboratory studies:  Results for orders placed during the hospital encounter of 06/07/12  SURGICAL PCR SCREEN      Component Value Range   MRSA, PCR NEGATIVE  NEGATIVE   Staphylococcus aureus NEGATIVE  NEGATIVE  BASIC METABOLIC PANEL      Component Value Range   Sodium 140  135 - 145 mEq/L   Potassium 3.9  3.5 - 5.1 mEq/L   Chloride 103  96 - 112 mEq/L   CO2 28  19 - 32 mEq/L   Glucose, Bld 91  70 - 99 mg/dL   BUN 16  6 - 23 mg/dL   Creatinine, Ser 4.09  0.50 - 1.10 mg/dL   Calcium 9.3  8.4 - 81.1 mg/dL   GFR calc non Af Amer 55 (*) >90 mL/min   GFR calc Af Amer 64 (*) >90 mL/min  CBC      Component Value Range   WBC 6.7  4.0 - 10.5 K/uL   RBC 3.75 (*) 3.87 - 5.11 MIL/uL   Hemoglobin 11.4 (*) 12.0 - 15.0 g/dL   HCT 91.4 (*) 78.2 - 95.6 %   MCV 91.7  78.0 - 100.0 fL   MCH 30.4  26.0 - 34.0 pg   MCHC 33.1  30.0 - 36.0 g/dL   RDW 21.3  08.6 - 57.8 %   Platelets 212  150 - 400 K/uL    Discharge Medications:   Medication List  As of 06/15/2012  1:41 PM   ASK your doctor about these medications         atorvastatin 20 MG tablet   Commonly known as: LIPITOR   Take 20 mg by mouth daily.      azelastine 137 MCG/SPRAY nasal  spray   Commonly known as: ASTELIN   Place 2 sprays into the nose 2 (two) times daily as needed. Use in each nostril as directed      calcium carbonate 1250 MG chewable tablet   Commonly known as: OS-CAL   Chew 1 tablet by mouth daily.      fentaNYL 25 MCG/HR   Commonly known as: DURAGESIC - dosed mcg/hr   Place 1 patch onto the skin every 3 (three) days. patch      fexofenadine-pseudoephedrine 180-240 MG per 24 hr tablet   Commonly known as: ALLEGRA-D 24   Take 1 tablet by mouth daily as needed. For allergies      omeprazole 40 MG capsule   Commonly known as: PRILOSEC   Take 40 mg by mouth daily before breakfast.      SYSTANE OP   Place 1 drop into both eyes 3 (three) times daily as needed. For allergies      Vitamin D 2000 UNITS Caps  Take 2,000 Units by mouth daily.            Diagnostic Studies: Dg Chest 2 View  06/14/2012  *RADIOLOGY REPORT*  Clinical Data: Right cervical lymph node resection.  CHEST - 2 VIEW  Comparison: 06/07/2012  Findings: Postop changes right neck region.  Surgical drain in place.  Stable heart size and vascularity.  No focal airspace process, edema, collapse, consolidation, effusion or pneumothorax.  IMPRESSION: Postop changes right cervical region.  No acute chest process.  Original Report Authenticated By: Judie Petit. Ruel Favors, M.D.   Dg Chest 2 View  06/07/2012  *RADIOLOGY REPORT*  Clinical Data: Preop for radical neck dissection  CHEST - 2 VIEW  Comparison: CT chest 05/23/2012  Findings: Cardiomediastinal silhouette is unremarkable.  No acute infiltrate or pleural effusion.  No pulmonary edema.  Bony thorax is unremarkable.  IMPRESSION: No active disease.  Original Report Authenticated By: Natasha Mead, M.D.   Nm Pet Image Restag (ps) Skull Base To Thigh  05/23/2012  *RADIOLOGY REPORT*  Clinical Data: Subsequent treatment strategy for cancer of the base of tongue.  NUCLEAR MEDICINE PET SKULL BASE TO THIGH  Fasting Blood Glucose:  79  Technique:  15  mCi F-18 FDG was injected intravenously. CT data was obtained and used for attenuation correction and anatomic localization only.  (This was not acquired as a diagnostic CT examination.) Additional exam technical data entered on technologist worksheet.  Comparison:  03/16/2012  Findings:  Neck: Increased FDG uptake there is a malignant range FDG uptake within the right sided level three lymph node.  This has an SUV max equal to 11.7.  Previously the SUV max within this area was equal to 3.5.  Chest:  No hypermetabolic mediastinal or hilar nodes.  No suspicious pulmonary nodules on the CT scan.  Abdomen/Pelvis:  No abnormal hypermetabolic activity within the liver, pancreas, adrenal glands, or spleen.  No hypermetabolic lymph nodes in the abdomen or pelvis.  [place]:  No focal hypermetabolic activity to suggest skeletal metastasis.  IMPRESSION:  1.  Examination is positive for hypermetabolic right level III lymph node consistent with residual or recurrence of tumor. 2.  No evidence for distant metastatic disease.  Original Report Authenticated By: Rosealee Albee, M.D.    Disposition: Final discharge disposition not confirmed  Discharge Orders    Future Appointments: Provider: Department: Dept Phone: Center:   06/16/2012 9:30 AM Sherrie Mustache [facility] [phone] None   06/16/2012 10:30 AM [facility] [phone] [place]   06/19/2012 8:45 AM Myrtis Ser, NP [facility] [phone] None   06/29/2012 8:50 AM Lurline Hare, MD [facility] [phone] None         Signed: Lumen Brinlee 06/15/2012, 1:41 PM

## 2012-06-16 ENCOUNTER — Other Ambulatory Visit: Payer: Self-pay | Admitting: Lab

## 2012-06-16 ENCOUNTER — Other Ambulatory Visit: Payer: Self-pay | Admitting: Oncology

## 2012-06-16 ENCOUNTER — Other Ambulatory Visit (HOSPITAL_COMMUNITY): Payer: Self-pay

## 2012-06-16 DIAGNOSIS — C01 Malignant neoplasm of base of tongue: Secondary | ICD-10-CM

## 2012-06-19 ENCOUNTER — Ambulatory Visit: Payer: Self-pay | Admitting: Oncology

## 2012-06-22 ENCOUNTER — Telehealth: Payer: Self-pay | Admitting: Oncology

## 2012-06-22 NOTE — Telephone Encounter (Signed)
S/w the pt and she is aware of her dec 2013 appts °

## 2012-06-26 NOTE — Progress Notes (Signed)
Received office notes from Dr. David Shoemaker @ [facility]; forwarded to Dr. Ha. 

## 2012-06-29 ENCOUNTER — Ambulatory Visit
Admission: RE | Admit: 2012-06-29 | Payer: BC Managed Care – PPO | Source: Ambulatory Visit | Admitting: Radiation Oncology

## 2012-06-29 ENCOUNTER — Encounter: Payer: Self-pay | Admitting: Radiation Oncology

## 2012-06-29 ENCOUNTER — Ambulatory Visit
Admission: RE | Admit: 2012-06-29 | Discharge: 2012-06-29 | Disposition: A | Discharge status: Home / Self Care | Payer: BC Managed Care – PPO | Source: Ambulatory Visit | Attending: Radiation Oncology | Admitting: Radiation Oncology

## 2012-06-29 VITALS — BP 105/66 | HR 81 | Temp 98.0°F | Resp 20 | Wt 105.3 lb

## 2012-06-29 DIAGNOSIS — C139 Malignant neoplasm of hypopharynx, unspecified: Secondary | ICD-10-CM

## 2012-06-29 MED ORDER — HYDROCODONE-ACETAMINOPHEN 7.5-500 MG/15ML PO SOLN: 15.0000 mL | Freq: Four times a day (QID) | ORAL | Status: DC | PRN

## 2012-06-29 NOTE — Progress Notes (Signed)
Pt recovering from right neck resection 2 weeks ago. Denies pain, no longer wearing Duragesic patch or taking Lortab. Taste buds and appetite good, does have dry mouth and lingering fatigue but reports energy improving.

## 2012-06-29 NOTE — Progress Notes (Signed)
   Department of Radiation Oncology  Phone:  [phone] Fax:        [phone]   Name: Sherry Craig   DOB: Aug 10, 1946  MRN: [id number]    Date: 06/29/2012  Follow Up Visit Note  Diagnosis: Locally advanced hypopharyngeal cancer  Interval since last radiation: 6 months  Interval History: Sherry Craig presents today for routine followup.  Sherry Craig is feeling well. Sherry Craig underwent a lymph node dissection but by Dr. Annalee Genta on 06/13/2012. The pathology was positive for residual squamous cell carcinoma Sherry Craig is scheduled to see Dr. Irving Burton in December. Overall Sherry Craig says Sherry Craig energy is great. Sherry Craig is looking forward to getting back to the gym. Sherry Craig has noticed some weakness and stinging-type pain in Sherry Craig right arm coming from Sherry Craig neck. Sherry Craig says Sherry Craig has to bend over Sherry Craig in order to brush Sherry Craig teeth. He has not noticed any swelling.  Allergies: No Known Allergies  Medications:  Current Outpatient Prescriptions  Medication Sig Dispense Refill  . atorvastatin (LIPITOR) 20 MG tablet Take 20 mg by mouth daily.       Marland Kitchen azelastine (ASTELIN) 137 MCG/SPRAY nasal spray Place 2 sprays into the nose 2 (two) times daily as needed. Use in each nostril as directed      . calcium carbonate (OS-CAL) 1250 MG chewable tablet Chew 1 tablet by mouth daily.       . Cholecalciferol (VITAMIN D) 2000 UNITS CAPS Take 2,000 Units by mouth daily.       . fexofenadine-pseudoephedrine (ALLEGRA-D 24) 180-240 MG per 24 hr tablet Take 1 tablet by mouth daily as needed. For allergies      . omeprazole (PRILOSEC) 40 MG capsule Take 40 mg by mouth daily before breakfast.       . Polyethyl Glycol-Propyl Glycol (SYSTANE OP) Place 1 drop into both eyes 3 (three) times daily as needed. For allergies      . fentaNYL (DURAGESIC - DOSED MCG/HR) 25 MCG/HR Place 1 patch (25 mcg total) onto the skin every 3 (three) days.  10 patch  0  . HYDROcodone-acetaminophen (LORTAB) 7.5-500 MG/15ML solution Take 15 mLs by mouth every 6 (six) hours as  needed for pain.  480 mL  0    Physical Exam:   weight is 105 lb 4.8 oz (47.764 kg). Sherry Craig oral temperature is 98 F (36.7 C). Sherry Craig blood pressure is 105/66 and Sherry Craig pulse is 81. Sherry Craig respiration is 20.  There is a large. Healing scar over the right neck. There is no evidence of infection. No palpable or visible signs of tumor recurrence. Sherry Craig is in good spirits.  IMPRESSION: Sherry Craig is a 66 y.o. female who is status post chemoradiation for locally advanced hypopharynx cancer. Sherry Craig did have residual disease in the right neck node and has undergone right neck dissection. Sherry Craig is recovering well from that.  PLAN:  I will see Sherry Craig back in 6 months. Sherry Craig has followup scheduled with Dr. Annalee Genta in 3 weeks and Dr. Irving Burton in December. I did give Sherry Craig a prescription for Lortab. Sherry Craig has weaned herself off Sherry Craig Duragesic patches. I've also referred Sherry Craig to physical therapy. Sherry Craig knows to contact us with any questions.    Lurline Hare, MD

## 2012-07-05 ENCOUNTER — Encounter: Payer: Self-pay | Admitting: Dietician

## 2012-07-05 NOTE — Progress Notes (Signed)
Brief Out patient Oncology Follow Up Note  Reason: Nutrition Risk for unintentional weight loss and decreased appetite.   Patient was seen by RD on 5/3. Patient's weight is down 3 lb. From May. Patient reported she is eating very well. She stated she eats frequently throughout the day and drinks Boost BID. Patient is without any nutrition questions or concerns at this time.   Patient has RD contact information.   RD available for nutrition risk.   Iven Finn [facility] [phone]

## 2012-07-14 ENCOUNTER — Ambulatory Visit: Payer: BC Managed Care – PPO | Attending: Radiation Oncology | Admitting: Physical Therapy

## 2012-07-14 DIAGNOSIS — M24519 Contracture, unspecified shoulder: Secondary | ICD-10-CM | POA: Insufficient documentation

## 2012-07-14 DIAGNOSIS — M25539 Pain in unspecified wrist: Secondary | ICD-10-CM | POA: Insufficient documentation

## 2012-07-14 DIAGNOSIS — I890 Lymphedema, not elsewhere classified: Secondary | ICD-10-CM | POA: Insufficient documentation

## 2012-07-14 DIAGNOSIS — M6281 Muscle weakness (generalized): Secondary | ICD-10-CM | POA: Insufficient documentation

## 2012-07-14 DIAGNOSIS — IMO0001 Reserved for inherently not codable concepts without codable children: Secondary | ICD-10-CM | POA: Insufficient documentation

## 2012-07-21 ENCOUNTER — Ambulatory Visit: Payer: BC Managed Care – PPO | Attending: Radiation Oncology | Admitting: Physical Therapy

## 2012-07-21 DIAGNOSIS — I890 Lymphedema, not elsewhere classified: Secondary | ICD-10-CM | POA: Insufficient documentation

## 2012-07-21 DIAGNOSIS — IMO0001 Reserved for inherently not codable concepts without codable children: Secondary | ICD-10-CM | POA: Insufficient documentation

## 2012-07-21 DIAGNOSIS — M6281 Muscle weakness (generalized): Secondary | ICD-10-CM | POA: Insufficient documentation

## 2012-07-21 DIAGNOSIS — M24519 Contracture, unspecified shoulder: Secondary | ICD-10-CM | POA: Insufficient documentation

## 2012-07-21 DIAGNOSIS — M25539 Pain in unspecified wrist: Secondary | ICD-10-CM | POA: Insufficient documentation

## 2012-07-25 ENCOUNTER — Ambulatory Visit: Payer: BC Managed Care – PPO | Admitting: Physical Therapy

## 2012-07-28 ENCOUNTER — Ambulatory Visit: Payer: BC Managed Care – PPO | Admitting: Physical Therapy

## 2012-08-02 ENCOUNTER — Ambulatory Visit: Payer: BC Managed Care – PPO | Admitting: Physical Therapy

## 2012-08-03 ENCOUNTER — Ambulatory Visit: Payer: BC Managed Care – PPO | Admitting: Physical Therapy

## 2012-08-07 ENCOUNTER — Ambulatory Visit: Payer: BC Managed Care – PPO | Admitting: Physical Therapy

## 2012-08-10 ENCOUNTER — Ambulatory Visit: Payer: BC Managed Care – PPO | Admitting: Physical Therapy

## 2012-08-14 ENCOUNTER — Ambulatory Visit: Payer: BC Managed Care – PPO

## 2012-08-14 ENCOUNTER — Encounter: Payer: Self-pay | Admitting: Physical Therapy

## 2012-08-17 ENCOUNTER — Ambulatory Visit: Payer: BC Managed Care – PPO | Attending: Radiation Oncology | Admitting: Physical Therapy

## 2012-08-17 DIAGNOSIS — I890 Lymphedema, not elsewhere classified: Secondary | ICD-10-CM | POA: Insufficient documentation

## 2012-08-17 DIAGNOSIS — M25539 Pain in unspecified wrist: Secondary | ICD-10-CM | POA: Insufficient documentation

## 2012-08-17 DIAGNOSIS — M24519 Contracture, unspecified shoulder: Secondary | ICD-10-CM | POA: Insufficient documentation

## 2012-08-17 DIAGNOSIS — IMO0001 Reserved for inherently not codable concepts without codable children: Secondary | ICD-10-CM | POA: Insufficient documentation

## 2012-08-17 DIAGNOSIS — M6281 Muscle weakness (generalized): Secondary | ICD-10-CM | POA: Insufficient documentation

## 2012-08-23 ENCOUNTER — Encounter: Payer: Self-pay | Admitting: Internal Medicine

## 2012-09-15 ENCOUNTER — Encounter: Payer: Self-pay | Admitting: Internal Medicine

## 2012-10-19 ENCOUNTER — Encounter: Payer: Self-pay | Admitting: Oncology

## 2012-10-19 ENCOUNTER — Other Ambulatory Visit (HOSPITAL_BASED_OUTPATIENT_CLINIC_OR_DEPARTMENT_OTHER): Payer: BC Managed Care – PPO | Admitting: Lab

## 2012-10-19 ENCOUNTER — Telehealth: Payer: Self-pay | Admitting: Oncology

## 2012-10-19 ENCOUNTER — Ambulatory Visit (HOSPITAL_BASED_OUTPATIENT_CLINIC_OR_DEPARTMENT_OTHER): Payer: BC Managed Care – PPO | Admitting: Oncology

## 2012-10-19 VITALS — BP 121/71 | HR 81 | Temp 97.0°F | Resp 20 | Ht 60.0 in | Wt 114.7 lb

## 2012-10-19 DIAGNOSIS — E440 Moderate protein-calorie malnutrition: Secondary | ICD-10-CM

## 2012-10-19 DIAGNOSIS — C139 Malignant neoplasm of hypopharynx, unspecified: Secondary | ICD-10-CM

## 2012-10-19 DIAGNOSIS — C01 Malignant neoplasm of base of tongue: Secondary | ICD-10-CM

## 2012-10-19 LAB — CBC WITH DIFFERENTIAL/PLATELET
BASO%: 1 % (ref 0.0–2.0)
Basophils Absolute: 0 10*3/uL (ref 0.0–0.1)
EOS%: 3.4 % (ref 0.0–7.0)
HCT: 38.2 % (ref 34.8–46.6)
HGB: 12.6 g/dL (ref 11.6–15.9)
LYMPH%: 36.3 % (ref 14.0–49.7)
MCH: 30.8 pg (ref 25.1–34.0)
MCHC: 32.9 g/dL (ref 31.5–36.0)
MCV: 93.8 fL (ref 79.5–101.0)
MONO%: 9 % (ref 0.0–14.0)
NEUT%: 50.3 % (ref 38.4–76.8)
Platelets: 229 10*3/uL (ref 145–400)

## 2012-10-19 LAB — COMPREHENSIVE METABOLIC PANEL (CC13)
Albumin: 4 g/dL (ref 3.5–5.0)
Alkaline Phosphatase: 79 U/L (ref 40–150)
BUN: 11 mg/dL (ref 7.0–26.0)
Creatinine: 0.9 mg/dL (ref 0.6–1.1)
Glucose: 94 mg/dl (ref 70–99)
Potassium: 4 mEq/L (ref 3.5–5.1)
Total Bilirubin: 0.42 mg/dL (ref 0.20–1.20)

## 2012-10-19 NOTE — Progress Notes (Signed)
[facility]  Telephone:([phone]) [phone] [phone]   OFFICE PROGRESS NOTE   DIAGNOSIS: newly diagnosed cT2 N1 Mx right hypopharynx squamous cell carcinoma.   PAST THERAPY:   1. concurrent q3wk Cisplatin and daily radiation between 10/28/2011 and 12/21/2011.  2. S/P right neck dissection on 06/13/12.   CURRENT THERAPY:  Watchful observation.   INTERVAL HISTORY: Sherry Craig 66 y.o. female returns for regular follow up. She reported that she has been doing well.  She has recovered well from her surgery. She could no longer palpate any right cervical neck adenopathy or any mass anywhere else.  She does have thick skin of the neck from radiation but no swelling.  She has been taking PO without difficulty and gaining weight. She still has dry mouth and sensitivity to spicy foods; however, she is able to eat pretty much all kinds of foods now.   Patient denies fatigue, headache, visual changes, confusion, drenching night sweats, palpable lymph node swelling, mucositis, odynophagia, dysphagia, nausea vomiting, jaundice, chest pain, palpitation, shortness of breath, dyspnea on exertion, productive cough, gum bleeding, epistaxis, hematemesis, hemoptysis, abdominal pain, abdominal swelling, early satiety, melena, hematochezia, hematuria, skin rash, spontaneous bleeding, joint swelling, joint pain, heat or cold intolerance, bowel bladder incontinence, back pain, focal motor weakness, paresthesia, depression, suicidal or homocidal ideation, feeling hopelessness.   Past Medical History  Diagnosis Date  . Hyperlipemia   . Allergy   . Recurrent sinus infections   . 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 Tongue cancer   . GERD (gastroesophageal reflux disease)   . Osteopenia   . 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 Sinus infection     History of  . S/P radiation therapy 10/28/2011 - 12/17/2011    70 Gy to right Hypopharynx, involved  lymph nodes, uninvolved lymph nodes  . Status post chemotherapy Started 10/28/2011    Q3Wk Cisplatin concurrent with Radiation Therapy  . H/O exercise stress test     done  >15 yrs. ago due to abnormal EKG.  Stress test was wnl, no need for f/u   . Arthritis     hands & feet     Past Surgical History  Procedure Date  . Tubal ligation   . Fine needle aspiration 09/03/11    FNA to right Neck Mass - Squamous Cell Carcinoma of the Right Hypopharynx  . Refractive surgery     for retinal tears- seen by Dr. Allyne Gee   . Diagnostic laryngoscopy     done by Dr. Annalee Genta- 07/2011- to  be done for diagnosis of CA of hypopharynyx   . Radical neck dissection 06/13/2012    Procedure: RADICAL NECK DISSECTION;  Surgeon: Osborn Coho, MD;  Location: [facility] OR;  Service: ENT;  Laterality: Right;  Right Neck Dissection     Current Outpatient Prescriptions  Medication Sig Dispense Refill  . atorvastatin (LIPITOR) 20 MG tablet Take 20 mg by mouth daily.       Marland Kitchen azelastine (ASTELIN) 137 MCG/SPRAY nasal spray Place 2 sprays into the nose 2 (two) times daily as needed. Use in each nostril as directed      . calcium carbonate (OS-CAL) 1250 MG chewable tablet Chew 1 tablet by mouth daily.       . Cholecalciferol (VITAMIN D) 2000 UNITS CAPS Take 2,000 Units by mouth daily.       . fexofenadine-pseudoephedrine (ALLEGRA-D 24) 180-240 MG per 24 hr tablet Take 1 tablet by mouth daily  as needed. For allergies      . HYDROcodone-acetaminophen (LORTAB) 7.5-500 MG/15ML solution Take 15 mLs by mouth every 6 (six) hours as needed for pain.  480 mL  0  . omeprazole (PRILOSEC) 40 MG capsule Take 40 mg by mouth daily before breakfast.       . Polyethyl Glycol-Propyl Glycol (SYSTANE OP) Place 1 drop into both eyes 3 (three) times daily as needed. For allergies        ALLERGIES:   has no known allergies.  REVIEW OF SYSTEMS:  The rest of the 14-point review of system was negative.   Filed Vitals:   10/19/12 1129  BP:  121/71  Pulse: 81  Temp: 97 F (36.1 C)  Resp: 20   Wt Readings from Last 3 Encounters:  10/19/12 114 lb 11.2 oz (52.028 kg)  06/29/12 105 lb 4.8 oz (47.764 kg)  06/14/12 106 lb 14.8 oz (48.5 kg)   ECOG Performance status: 0  PHYSICAL EXAMINATION:   General:  Thin-appearing woman, in no acute distress.  Eyes:  no scleral icterus.  ENT:  There were no oropharyngeal lesions.  Neck was without thyromegaly. There was dark skin and hyperkaratotic changes in the skin of the neck but no edema.   Lymphatics:  Negative cervical, supraclavicular or axillary adenopathy.  I could not palpate the right cervical node.  Respiratory: lungs were clear bilaterally without wheezing or crackles.  Cardiovascular:  Regular rate and rhythm, S1/S2, without murmur, rub or gallop.  There was no pedal edema.  GI:  abdomen was soft, flat, nontender, nondistended, without organomegaly.  Muscoloskeletal:  no spinal tenderness of palpation of vertebral spine.  Skin exam was without echymosis, petichae.  Neuro exam was nonfocal.  Patient was able to get on and off exam table without assistance.  Gait was normal.  Patient was alerted and oriented.  Attention was good.   Language was appropriate.  Mood was normal without depression.  Speech was not pressured.  Thought content was not tangential.      LABORATORY/RADIOLOGY DATA:  Lab Results  Component Value Date   WBC 4.0 10/19/2012   HGB 12.6 10/19/2012   HCT 38.2 10/19/2012   PLT 229 10/19/2012   GLUCOSE 94 10/19/2012   ALKPHOS 79 10/19/2012   ALT 14 10/19/2012   AST 21 10/19/2012   NA 140 10/19/2012   K 4.0 10/19/2012   CL 102 10/19/2012   CREATININE 0.9 10/19/2012   BUN 11.0 10/19/2012   CO2 30* 10/19/2012   INR 0.96 10/15/2011    ASSESSMENT AND PLAN:   1.Hypopharyngeal SCC: S/p 2 of 3 cycles of chemotherapy with Cisplatin.  Her PET scan post treatment showed resolution of the uptake in the primary tumor.  However, there was slight residual activity in the right  cervical node. She is s/p right neck dissection. Recommend routine follow-up with Dr Annalee Genta and Rad Onc. Will obtain a CT scan of the neck in 6 months and a CXR annually.   2. Mucositis: Resolved.  3. Moderate calorie/protein malnutrition: Now taking all nutrition by mouth and gaining weight.   4. Hyperlipidemia: She's on Lipitor per PCP.   5. F/U: in 6 months with CT and CXR day before visit.

## 2012-10-19 NOTE — Telephone Encounter (Signed)
appts made and printed for pt pt aware that she will get her scan appt in the mail

## 2012-12-26 DIAGNOSIS — Z923 Personal history of irradiation: Secondary | ICD-10-CM | POA: Insufficient documentation

## 2012-12-28 ENCOUNTER — Ambulatory Visit: Payer: BC Managed Care – PPO | Admitting: Radiation Oncology

## 2012-12-28 ENCOUNTER — Telehealth: Payer: Self-pay | Admitting: *Deleted

## 2012-12-28 NOTE — Telephone Encounter (Signed)
CALLED PATIENT TO ALTER FU VISIT FOR TODAY DUE TO BAD WEATHER, RESCHEDULED FOR 01-04-13, SPOKE WITH PATIENT AND SHE IS AWARE OF THE APPT. CHANGE AND IS O.K. WITH  IT

## 2013-01-04 ENCOUNTER — Encounter: Payer: Self-pay | Admitting: Radiation Oncology

## 2013-01-04 ENCOUNTER — Ambulatory Visit
Admission: RE | Admit: 2013-01-04 | Discharge: 2013-01-04 | Disposition: A | Discharge status: Home / Self Care | Payer: BC Managed Care – PPO | Source: Ambulatory Visit | Attending: Radiation Oncology | Admitting: Radiation Oncology

## 2013-01-04 VITALS — BP 129/69 | HR 68 | Temp 98.0°F | Resp 20 | Wt 120.5 lb

## 2013-01-04 DIAGNOSIS — C139 Malignant neoplasm of hypopharynx, unspecified: Secondary | ICD-10-CM

## 2013-01-04 NOTE — Progress Notes (Addendum)
   Department of Radiation Oncology  Phone:  [phone] Fax:        [phone]   Name: Sherry Craig MRN: [id number]  DOB: 02-Aug-1946  Date: 01/04/2013  Follow Up Visit Note  Diagnosis: T2 N1 hypopharynx cancer  Summary and Interval since last radiation: One year 9 chemoradiation to a total dose of 70 gray)  Interval History: Sherry Craig presents today for routine followup.  She of course had resection of her residual right lymph node in July. She is recovered well from that. She worked with physical therapy and is now working at [employer] She still has some fibrosis of her right neck but she is really pleased with what she is able to do functionally. 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 She is using by 18 and a product called Oramoist for her chronic dry mouth. She has not scheduled an appointment to see Dr. Annalee Genta but is supposed to be seeing him this month. She is scheduled for a CT of the neck and chest on June 3 and a followup with Dr. Gaylyn Rong. He has no pain with swallowing. No new swelling or nodules. She is not taking any Lortab.  Allergies: No Known Allergies  Medications:  Current Outpatient Prescriptions  Medication Sig Dispense Refill  . atorvastatin (LIPITOR) 20 MG tablet Take 20 mg by mouth daily.       Marland Kitchen azelastine (ASTELIN) 137 MCG/SPRAY nasal spray Place 2 sprays into the nose 2 (two) times daily as needed. Use in each nostril as directed      . calcium carbonate (OS-CAL) 1250 MG chewable tablet Chew 1 tablet by mouth daily.       . Cholecalciferol (VITAMIN D) 2000 UNITS CAPS Take 2,000 Units by mouth daily.       . fexofenadine-pseudoephedrine (ALLEGRA-D 24) 180-240 MG per 24 hr tablet Take 1 tablet by mouth daily as needed. For allergies      . Polyethyl Glycol-Propyl Glycol (SYSTANE OP) Place 1 drop into both eyes 3 (three) times daily as needed. For allergies      . omeprazole (PRILOSEC)  40 MG capsule Take 40 mg by mouth daily before breakfast.        No current facility-administered medications for this encounter.    Physical Exam:  Filed Vitals:   01/04/13 1531  BP: 129/69  Pulse: 68  Temp: 98 F (36.7 C)  Resp: 20   she has fibrosis of the right neck. It the tissue there is very hard. There is no palpable or visible signs of tumor recurrence. She has moist mucous membranes in her oral cavity. No signs of thrush or lesions. Indirect mirror examination shows focal cords approximated at midline with no evidence of recurrence. Her weight is up 15 pounds to 120 pounds.  IMPRESSION: Sherry Craig is a 67 y.o. female status post chemoradiation with residual right lymphadenopathy for a locally advanced hypopharynx cancer one year out from treatment with no evidence of disease  PLAN:  Sherry Craig looks great. I will plan on seeing her back in October. She is going to call Dr. Annalee Genta for a followup appointment. She knows to call us with any questions or concerns.    Lurline Hare, MD

## 2013-01-04 NOTE — Progress Notes (Signed)
Pt denies pain, fatigue, loss of appetite. She has dry mouth but uses Biotene products and a product on the roof of her mouth that adds moisture. She is tutoring math part time.

## 2013-02-22 ENCOUNTER — Encounter: Payer: Self-pay | Admitting: Internal Medicine

## 2013-04-17 ENCOUNTER — Ambulatory Visit (HOSPITAL_COMMUNITY)
Admission: RE | Admit: 2013-04-17 | Discharge: 2013-04-17 | Disposition: A | Discharge status: Home / Self Care | Payer: BC Managed Care – PPO | Source: Ambulatory Visit | Attending: Oncology | Admitting: Oncology

## 2013-04-17 ENCOUNTER — Other Ambulatory Visit (HOSPITAL_BASED_OUTPATIENT_CLINIC_OR_DEPARTMENT_OTHER): Payer: PRIVATE HEALTH INSURANCE | Admitting: Lab

## 2013-04-17 DIAGNOSIS — C139 Malignant neoplasm of hypopharynx, unspecified: Secondary | ICD-10-CM

## 2013-04-17 DIAGNOSIS — Z87891 Personal history of nicotine dependence: Secondary | ICD-10-CM | POA: Insufficient documentation

## 2013-04-17 LAB — COMPREHENSIVE METABOLIC PANEL (CC13)
AST: 19 U/L (ref 5–34)
Albumin: 3.9 g/dL (ref 3.5–5.0)
Alkaline Phosphatase: 90 U/L (ref 40–150)
BUN: 11.6 mg/dL (ref 7.0–26.0)
Potassium: 4.1 mEq/L (ref 3.5–5.1)

## 2013-04-17 LAB — CBC WITH DIFFERENTIAL/PLATELET
Basophils Absolute: 0 10*3/uL (ref 0.0–0.1)
EOS%: 4.4 % (ref 0.0–7.0)
HGB: 12.6 g/dL (ref 11.6–15.9)
MCH: 30.2 pg (ref 25.1–34.0)
MCV: 92.3 fL (ref 79.5–101.0)
MONO%: 9.1 % (ref 0.0–14.0)
RBC: 4.17 10*6/uL (ref 3.70–5.45)
RDW: 14 % (ref 11.2–14.5)

## 2013-04-17 MED ORDER — IOHEXOL 300 MG/ML  SOLN
100.0000 mL | Freq: Once | INTRAMUSCULAR | Status: AC | PRN
  Administered 2013-04-17: 100 mL via INTRAVENOUS

## 2013-04-19 ENCOUNTER — Other Ambulatory Visit (HOSPITAL_COMMUNITY): Payer: Self-pay

## 2013-04-19 ENCOUNTER — Ambulatory Visit (HOSPITAL_BASED_OUTPATIENT_CLINIC_OR_DEPARTMENT_OTHER): Payer: BC Managed Care – PPO | Admitting: Oncology

## 2013-04-19 ENCOUNTER — Telehealth: Payer: Self-pay | Admitting: Oncology

## 2013-04-19 VITALS — BP 132/66 | HR 70 | Temp 98.0°F | Resp 18 | Ht 60.0 in | Wt 125.9 lb

## 2013-04-19 DIAGNOSIS — C01 Malignant neoplasm of base of tongue: Secondary | ICD-10-CM

## 2013-04-19 DIAGNOSIS — C139 Malignant neoplasm of hypopharynx, unspecified: Secondary | ICD-10-CM

## 2013-04-19 DIAGNOSIS — M899 Disorder of bone, unspecified: Secondary | ICD-10-CM

## 2013-04-19 NOTE — Telephone Encounter (Signed)
Gave pt appt for lab and MD for next year June 2015

## 2013-04-19 NOTE — Patient Instructions (Addendum)
1.  Diagnosis:  History of hypopharynx cancer. 2.  Status:  In remission. 3.  Follow up:  CT neck and Chest Xray in about 1 year.  Follow up with ENT, Rad Onc, and Med onc.

## 2013-04-19 NOTE — Progress Notes (Signed)
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   PAST THERAPY:  concurrent q3wk Cisplatin and daily radiation between 10/28/2011 and 12/21/2011.   CURRENT THERAPY:  Watchful observation.   INTERVAL HISTORY: Sherry Craig 67 y.o. female returns for regular follow up with her husband.  She reports doing well.  She tutors math part time.  She has right neck scar and mild stiffness from past treatment.  She performs daily exercise.  She denied restricted range of motion of her neck.  She still has mild xerostomia; however, she has no diet restriction. She denied palpable neck nodes, dysphagia, odynophagia, hoarse voice, anorexia, weight loss, bone pain, bleeding symptoms.  The rest of the 14-point review of system was negative.    Past Medical History  Diagnosis Date  . Hyperlipemia   . Allergy   . Recurrent sinus infections   . 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 Tongue cancer   . GERD (gastroesophageal reflux disease)   . Osteopenia   . 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 Sinus infection     History of  . S/P radiation therapy 10/28/2011 - 12/17/2011    70 Gy to right Hypopharynx, involved lymph nodes, uninvolved lymph nodes  . Status post chemotherapy Started 10/28/2011    Q3Wk Cisplatin concurrent with Radiation Therapy  . H/O exercise stress test     done  >15 yrs. ago due to abnormal EKG.  Stress test was wnl, no need for f/u   . Arthritis     hands & feet     Past Surgical History  Procedure Laterality Date  . Tubal ligation    . Fine needle aspiration  09/03/11    FNA to right Neck Mass - Squamous Cell Carcinoma of the Right Hypopharynx  . Refractive surgery      for retinal tears- seen by Dr. Allyne Gee   . Diagnostic laryngoscopy      done by Dr. Annalee Genta- 07/2011- to  be done for diagnosis of  CA of hypopharynyx   . Radical neck dissection  06/13/2012    Procedure: RADICAL NECK DISSECTION;  Surgeon: Osborn Coho, MD;  Location: [facility] OR;  Service: ENT;  Laterality: Right;  Right Neck Dissection     Current Outpatient Prescriptions  Medication Sig Dispense Refill  . calcium carbonate (OS-CAL) 1250 MG chewable tablet Chew 1 tablet by mouth daily.       . Cholecalciferol (VITAMIN D) 2000 UNITS CAPS Take 2,000 Units by mouth daily.       . fexofenadine-pseudoephedrine (ALLEGRA-D 24) 180-240 MG per 24 hr tablet Take 1 tablet by mouth daily as needed. For allergies      . omeprazole (PRILOSEC) 40 MG capsule Take 40 mg by mouth daily before breakfast.       . Polyethyl Glycol-Propyl Glycol (SYSTANE OP) Place 1 drop into both eyes 3 (three) times daily as needed. For allergies       No current facility-administered medications for this visit.    ALLERGIES:  has No Known Allergies.  REVIEW OF SYSTEMS:  The rest of the 14-point review of system was negative.   Filed Vitals:   04/19/13 0927  BP: 132/66  Pulse: 70  Temp: 98 F (36.7 C)  Resp: 18   Wt Readings from Last 3 Encounters:  04/19/13 125 lb 14.4 oz (57.108 kg)  01/04/13 120 lb 8 oz (54.658 kg)  10/19/12 114 lb 11.2 oz (52.028 kg)   ECOG Performance status: 0  PHYSICAL EXAMINATION:   General:  Thin-appearing woman, in no acute distress.  Eyes:  no scleral icterus.  ENT:  There were no oropharyngeal lesions.  Neck was without thyromegaly. There was dark skin and hyperkaratotic changes in the skin of the neck but no edema.   Lymphatics:  Negative for cervical, supraclavicular or axillary adenopathy.  Respiratory: lungs were clear bilaterally without wheezing or crackles.  Cardiovascular:  Regular rate and rhythm, S1/S2, without murmur, rub or gallop.  There was no pedal edema.  GI:  abdomen was soft, flat, nontender, nondistended, without organomegaly.  Muscoloskeletal:  no spinal tenderness of palpation of vertebral spine.   Skin exam was without echymosis, petichae.  Neuro exam was nonfocal.  Patient was able to get on and off exam table without assistance.  Gait was normal.  Patient was alert and oriented.  Attention was good.   Language was appropriate.  Mood was normal without depression.  Speech was not pressured.  Thought content was not tangential.      LABORATORY/RADIOLOGY DATA:  Lab Results  Component Value Date   WBC 3.5* 04/17/2013   HGB 12.6 04/17/2013   HCT 38.5 04/17/2013   PLT 232 04/17/2013   GLUCOSE 78 04/17/2013   ALKPHOS 90 04/17/2013   ALT 14 04/17/2013   AST 19 04/17/2013   NA 140 04/17/2013   K 4.1 04/17/2013   CL 103 04/17/2013   CREATININE 0.9 04/17/2013   BUN 11.6 04/17/2013   CO2 28 04/17/2013   INR 0.96 10/15/2011    RADIOLOGY:  I personally reviewed the following CT and chest Xray from 04/17/13 and showed the images to the patient and her husband.    CT neck:  Findings: Interval right neck dissection. Postoperative changes  with no residual right neck lymph node identified.  Sequelae of XRT with pharyngeal and laryngeal mucosal thickening.  Parapharyngeal and sublingual spaces are normal. Trace  retropharyngeal effusion compatible with sequelae of radiation.  Mildly asymmetric appearance of the aryepiglottic folds, but no  discrete supraglottic/hypopharyngeal mass or hyperenhancement.  No abnormal left side lymph node. Stable thyroid with a small  right lobe hypodense nodule. Right IJ now surgically absent.  Remaining major vascular structures in the neck are patent.  Visualized orbit soft tissues are within normal limits. Negative  visualized brain parenchyma. Stable paranasal sinuses, mastoid and  petrous air cells. No acute osseous abnormality identified.  No superior mediastinal lymphadenopathy. Negative visualized lung  parenchyma except for mild scarring/post radiation change.  IMPRESSION:  Post therapy changes including interval right modified radical neck  dissection. No residual  hypopharyngeal mass or lymphadenopathy  identified.   Chest Xray:  Findings: The lungs are well aerated and clear. No pneumothorax is  seen. The drain appears to have been removed from the soft tissues  of the right neck. Heart size is stable. No bony abnormality is  seen.  IMPRESSION:  No active lung disease.   ASSESSMENT AND PLAN:   1.Hypopharyngeal SCC: continue to be in remission.  She does not smoke, chew tobacco, or drink EtOH heavily.    2. Mucositis:  Resolved.   3. Moderate calorie/protein malnutrition: resolved.  Her weight is now at baseline.   4. F/U: in one year with CT neck and CXR the day prior.  I advised her to continue follow up with ENT and Rad Onc.   5.  Cancer screening:  She reported to be up to date with mammogram, colonoscopy, and Papsmear.   I informed [name] that I am leaving the practice. [facility] will arrange for her to see another provider when she returns to clinic.     The length of time of the face-to-face encounter was . More than 50% of time was spent counseling and coordination of care.    Ramello Cordial T. Gaylyn Rong, M.D.

## 2013-08-22 ENCOUNTER — Other Ambulatory Visit: Payer: Self-pay

## 2014-04-16 ENCOUNTER — Other Ambulatory Visit: Payer: Self-pay | Admitting: Hematology and Oncology

## 2014-04-16 DIAGNOSIS — C029 Malignant neoplasm of tongue, unspecified: Secondary | ICD-10-CM

## 2014-04-16 DIAGNOSIS — R5383 Other fatigue: Secondary | ICD-10-CM

## 2014-04-17 ENCOUNTER — Telehealth: Payer: Self-pay | Admitting: *Deleted

## 2014-04-17 ENCOUNTER — Other Ambulatory Visit (HOSPITAL_BASED_OUTPATIENT_CLINIC_OR_DEPARTMENT_OTHER): Payer: Medicare Other

## 2014-04-17 ENCOUNTER — Other Ambulatory Visit: Payer: Self-pay | Admitting: Hematology and Oncology

## 2014-04-17 ENCOUNTER — Ambulatory Visit (HOSPITAL_COMMUNITY)
Admission: RE | Admit: 2014-04-17 | Discharge: 2014-04-17 | Disposition: A | Discharge status: Home / Self Care | Payer: Medicare Other | Source: Ambulatory Visit | Attending: Oncology | Admitting: Oncology

## 2014-04-17 DIAGNOSIS — E785 Hyperlipidemia, unspecified: Secondary | ICD-10-CM

## 2014-04-17 DIAGNOSIS — R5383 Other fatigue: Secondary | ICD-10-CM

## 2014-04-17 DIAGNOSIS — C01 Malignant neoplasm of base of tongue: Secondary | ICD-10-CM

## 2014-04-17 DIAGNOSIS — C139 Malignant neoplasm of hypopharynx, unspecified: Secondary | ICD-10-CM

## 2014-04-17 DIAGNOSIS — C029 Malignant neoplasm of tongue, unspecified: Secondary | ICD-10-CM

## 2014-04-17 DIAGNOSIS — Z9221 Personal history of antineoplastic chemotherapy: Secondary | ICD-10-CM | POA: Insufficient documentation

## 2014-04-17 DIAGNOSIS — J984 Other disorders of lung: Secondary | ICD-10-CM | POA: Insufficient documentation

## 2014-04-17 DIAGNOSIS — R918 Other nonspecific abnormal finding of lung field: Secondary | ICD-10-CM

## 2014-04-17 DIAGNOSIS — D1809 Hemangioma of other sites: Secondary | ICD-10-CM | POA: Insufficient documentation

## 2014-04-17 LAB — CBC WITH DIFFERENTIAL/PLATELET
BASO%: 0.8 % (ref 0.0–2.0)
Basophils Absolute: 0 10*3/uL (ref 0.0–0.1)
EOS%: 2.4 % (ref 0.0–7.0)
Eosinophils Absolute: 0.1 10*3/uL (ref 0.0–0.5)
HCT: 38.8 % (ref 34.8–46.6)
HGB: 12.5 g/dL (ref 11.6–15.9)
LYMPH%: 33.5 % (ref 14.0–49.7)
MCH: 29.7 pg (ref 25.1–34.0)
MCHC: 32.1 g/dL (ref 31.5–36.0)
MCV: 92.4 fL (ref 79.5–101.0)
MONO#: 0.2 10*3/uL (ref 0.1–0.9)
MONO%: 5.8 % (ref 0.0–14.0)
NEUT#: 2.3 10*3/uL (ref 1.5–6.5)
NEUT%: 57.5 % (ref 38.4–76.8)
Platelets: 234 10*3/uL (ref 145–400)
RBC: 4.2 10*6/uL (ref 3.70–5.45)
RDW: 15.2 % — ABNORMAL HIGH (ref 11.2–14.5)
WBC: 4 10*3/uL (ref 3.9–10.3)
lymph#: 1.4 10*3/uL (ref 0.9–3.3)

## 2014-04-17 LAB — COMPREHENSIVE METABOLIC PANEL (CC13)
ALT: 9 U/L (ref 0–55)
AST: 17 U/L (ref 5–34)
Albumin: 4.1 g/dL (ref 3.5–5.0)
Alkaline Phosphatase: 86 U/L (ref 40–150)
Anion Gap: 14 mEq/L — ABNORMAL HIGH (ref 3–11)
BUN: 13.6 mg/dL (ref 7.0–26.0)
CO2: 22 mEq/L (ref 22–29)
Calcium: 9.5 mg/dL (ref 8.4–10.4)
Chloride: 105 mEq/L (ref 98–109)
Creatinine: 0.8 mg/dL (ref 0.6–1.1)
Glucose: 100 mg/dl (ref 70–140)
Potassium: 4.2 mEq/L (ref 3.5–5.1)
Sodium: 141 mEq/L (ref 136–145)
Total Bilirubin: 0.25 mg/dL (ref 0.20–1.20)
Total Protein: 7.3 g/dL (ref 6.4–8.3)

## 2014-04-17 LAB — T4, FREE: FREE T4: 1.17 ng/dL (ref 0.80–1.80)

## 2014-04-17 LAB — TSH CHCC: TSH: 3.219 m(IU)/L (ref 0.308–3.960)

## 2014-04-17 MED ORDER — IOHEXOL 300 MG/ML  SOLN
100.0000 mL | Freq: Once | INTRAMUSCULAR | Status: AC | PRN
  Administered 2014-04-17: 100 mL via INTRAVENOUS

## 2014-04-17 NOTE — Telephone Encounter (Signed)
Received call from Sherry Craig/[facility] with a report on this pt that notes some concerns about pulmonary masses which are worrisome for mets.  Report was faxed to [place] & placed on Dr Calton Dach desk for review for upcoming appt.

## 2014-04-17 NOTE — Telephone Encounter (Signed)
Informed pt of CT scan chest needed and scheduled for tomorrow at 11:30 am.  Pt to arrive at 11:15 am to [facility], nothing to eat for four hours prior to exam.  Ok to drink liquids.  Pt verbalized understanding.  Notified Benedetto Goad for pre cert.

## 2014-04-17 NOTE — Telephone Encounter (Signed)
CXR suggested need for CT chest.  Dr. Alvy Bimler placed order for CT to be done STAT.  Left VM for pt to return nurse's call.  LVM on home and cell phone numbers.

## 2014-04-18 ENCOUNTER — Encounter (HOSPITAL_COMMUNITY): Payer: Self-pay

## 2014-04-18 ENCOUNTER — Ambulatory Visit (HOSPITAL_COMMUNITY)
Admission: RE | Admit: 2014-04-18 | Discharge: 2014-04-18 | Disposition: A | Discharge status: Home / Self Care | Payer: Medicare Other | Source: Ambulatory Visit | Attending: Hematology and Oncology | Admitting: Hematology and Oncology

## 2014-04-18 DIAGNOSIS — C01 Malignant neoplasm of base of tongue: Secondary | ICD-10-CM | POA: Insufficient documentation

## 2014-04-18 DIAGNOSIS — F17200 Nicotine dependence, unspecified, uncomplicated: Secondary | ICD-10-CM | POA: Insufficient documentation

## 2014-04-18 DIAGNOSIS — J984 Other disorders of lung: Secondary | ICD-10-CM | POA: Insufficient documentation

## 2014-04-18 DIAGNOSIS — R918 Other nonspecific abnormal finding of lung field: Secondary | ICD-10-CM

## 2014-04-18 MED ORDER — IOHEXOL 300 MG/ML  SOLN
80.0000 mL | Freq: Once | INTRAMUSCULAR | Status: AC | PRN
  Administered 2014-04-18: 75 mL via INTRAVENOUS

## 2014-04-19 ENCOUNTER — Other Ambulatory Visit: Payer: Self-pay | Admitting: Lab

## 2014-04-19 ENCOUNTER — Ambulatory Visit (HOSPITAL_BASED_OUTPATIENT_CLINIC_OR_DEPARTMENT_OTHER): Payer: Medicare Other | Admitting: Hematology and Oncology

## 2014-04-19 ENCOUNTER — Encounter: Payer: Self-pay | Admitting: Hematology and Oncology

## 2014-04-19 VITALS — BP 139/73 | HR 83 | Temp 97.6°F | Resp 20 | Ht 60.0 in | Wt 133.1 lb

## 2014-04-19 DIAGNOSIS — C139 Malignant neoplasm of hypopharynx, unspecified: Secondary | ICD-10-CM

## 2014-04-19 DIAGNOSIS — C7800 Secondary malignant neoplasm of unspecified lung: Secondary | ICD-10-CM

## 2014-04-19 DIAGNOSIS — R918 Other nonspecific abnormal finding of lung field: Secondary | ICD-10-CM

## 2014-04-19 NOTE — Assessment & Plan Note (Signed)
This is consistent with metastatic disease. We discussed about the role of biopsy and treatment.

## 2014-04-19 NOTE — Progress Notes (Signed)
[facility] progress notes  Patient Care Team: Baruch Goldmann, PA-C as PCP - General (Physician Assistant) Thea Silversmith, MD (Radiation Oncology) Jerrell Belfast, MD (Otolaryngology) Nobie Putnam, MD (Hematology and Oncology)  CHIEF COMPLAINTS/PURPOSE OF VISIT:  Base of tongue cancer  HISTORY OF PRESENTING ILLNESS:  Sherry Craig 68 y.o. female was transferred to my care after her prior physician has left.  I reviewed the patient's records extensive and collaborated the history with the patient. Summary of her history is as follows: She was found to have base of tongue cancer after presentation with a mass in the neck.  Oncology History   Cancer of hypopharynx, base of tongue, squamous cell carcinoma   Primary site: Pharynx - Hypopharynx   Staging method: AJCC 7th Edition   Clinical: Stage III (T2, N1, M0) signed by Thea Silversmith, MD on 10/07/2011 11:50 AM   Summary: Stage III (T2, N1, M0)       Cancer of hypopharynx   07/21/2011 Pathology Neck fine needle aspirate was negative for malignancy.   09/03/2011 Pathology Right neck fine needle aspirate and biopsy confirmed malignancy, squamous cell carcinoma, HPV marginally positive.   09/30/2011 Imaging PET CT scan confirmed right hypopharynx mass with regional lymphadenopathy.   10/28/2011 - 12/17/2011 Radiation Therapy The patient completed radiation therapy.   10/28/2011 - 12/23/2011 Chemotherapy The patient received 3 doses of high dose cisplatin. Her last dose of chemotherapy have 25% dose adjustment due to side effects.   03/16/2012 Imaging Repeat PET scan confirmed near complete response to treatment.   05/23/2012 Imaging Repeat PET/CT scan confirmed recurrence of disease in the right neck lymph node.   06/14/2012 Pathology Pathology confirmed only one lymph node involvement.   06/14/2012 Surgery The patient underwent right neck dissection for residual and recurrence of disease.   04/17/2014 Imaging Chest  x-ray show multiple pulmonary nodules. CT scan of the neck showed no evidence of recurrence of disease.   04/18/2014 Imaging CT chest confirmed multiple pulmonary nodules highly suspicious for metastatic disease.   She is doing well apart from persistent dry mouth and some mild dysphagia. She denies any recent weight loss. Denies any recent cough.  MEDICAL HISTORY:  Past Medical History  Diagnosis Date  . Hyperlipemia   . Allergy   . Recurrent sinus infections   . 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 Tongue cancer   . GERD (gastroesophageal reflux disease)   . Osteopenia   . 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 Sinus infection     History of  . S/P radiation therapy 10/28/2011 - 12/17/2011    70 Gy to right Hypopharynx, involved lymph nodes, uninvolved lymph nodes  . Status post chemotherapy Started 10/28/2011    Q3Wk Cisplatin concurrent with Radiation Therapy  . H/O exercise stress test     done  >15 yrs. ago due to abnormal EKG.  Stress test was wnl, no need for f/u   . Arthritis     hands & feet     SURGICAL HISTORY: Past Surgical History  Procedure Laterality Date  . Tubal ligation    . Fine needle aspiration  09/03/11    FNA to right Neck Mass - Squamous Cell Carcinoma of the Right Hypopharynx  . Refractive surgery      for retinal tears- seen by Dr. B[facility]   . Diagnostic laryngoscopy      done by Dr. Wilburn Cornelia- 07/2011- to  be done for diagnosis  of CA of hypopharynyx   . Radical neck dissection  06/13/2012    Procedure: RADICAL NECK DISSECTION;  Surgeon: Jerrell Belfast, MD;  Location: [facility];  Service: ENT;  Laterality: Right;  Right Neck Dissection     SOCIAL HISTORY: History   Social History  . Marital Status: Married    Spouse Name: N/A    Number of Children: N/A  . Years of Education: N/A   Occupational History  . Not on file.   Social History Main Topics  . Smoking status: Former Smoker -- 1.00 packs/day for 30 years     Types: Cigarettes    Quit date: 11/15/2009  . Smokeless tobacco: Never Used  . Alcohol Use: 1.2 oz/week    2 Glasses of wine per week     Comment: none since late 2012  . Drug Use: No  . Sexual Activity: Not on file   Other Topics Concern  . Not on file   Social History Narrative  . No narrative on file    FAMILY HISTORY: Family History  Problem Relation Age of Onset  . Coronary artery disease Mother   . Heart disease Mother   . Hypertension Mother   . Heart attack Mother   . Pulmonary embolism Sister   . Cancer Maternal Aunt     bladder ca    ALLERGIES:  has No Known Allergies.  MEDICATIONS:  Current Outpatient Prescriptions  Medication Sig Dispense Refill  . calcium carbonate (OS-CAL) 1250 MG chewable tablet Chew 1 tablet by mouth daily.       . Cholecalciferol (VITAMIN D) 2000 UNITS CAPS Take 2,000 Units by mouth daily.       . fexofenadine-pseudoephedrine (ALLEGRA-D 24) 180-240 MG per 24 hr tablet Take 1 tablet by mouth daily as needed. For allergies      . omeprazole (PRILOSEC) 40 MG capsule Take 40 mg by mouth daily before breakfast.       . Polyethyl Glycol-Propyl Glycol (SYSTANE OP) Place 1 drop into both eyes 3 (three) times daily as needed. For allergies      . pravastatin (PRAVACHOL) 40 MG tablet Take 40 mg by mouth daily.       No current facility-administered medications for this visit.    REVIEW OF SYSTEMS:   Constitutional: Denies fevers, chills or abnormal night sweats Eyes: Denies blurriness of vision, double vision or watery eyes Ears, nose, mouth, throat, and face: Denies mucositis or sore throat Respiratory: Denies cough, dyspnea or wheezes Cardiovascular: Denies palpitation, chest discomfort or lower extremity swelling Gastrointestinal:  Denies nausea, heartburn or change in bowel habits Skin: Denies abnormal skin rashes Lymphatics: Denies new lymphadenopathy or easy bruising Neurological:Denies numbness, tingling or new  weaknesses Behavioral/Psych: Mood is stable, no new changes  All other systems were reviewed with the patient and are negative.  PHYSICAL EXAMINATION: ECOG PERFORMANCE STATUS: 0 - Asymptomatic  Filed Vitals:   04/19/14 0841  BP: 139/73  Pulse: 83  Temp: 97.6 F (36.4 C)  Resp: 20   Filed Weights   04/19/14 0841  Weight: 133 lb 1.6 oz (60.374 kg)    GENERAL:alert, no distress and comfortable SKIN: skin color, texture, turgor are normal, no rashes or significant lesions EYES: normal, conjunctiva are pink and non-injected, sclera clear OROPHARYNX:no exudate, normal lips, buccal mucosa, and tongue  NECK: There is well-healed surgical scar on the right side of the neck. Significant radiation-induced fibrosis is noted. LYMPH:  no palpable lymphadenopathy in the cervical, axillary or inguinal LUNGS: clear  to auscultation and percussion with normal breathing effort HEART: regular rate & rhythm and no murmurs without lower extremity edema ABDOMEN:abdomen soft, non-tender and normal bowel sounds Musculoskeletal:no cyanosis of digits and no clubbing  PSYCH: alert & oriented x 3 with fluent speech NEURO: no focal motor/sensory deficits  LABORATORY DATA:  I have reviewed the data as listed Lab Results  Component Value Date   WBC 4.0 04/17/2014   HGB 12.5 04/17/2014   HCT 38.8 04/17/2014   MCV 92.4 04/17/2014   PLT 234 04/17/2014    Recent Labs  04/17/14 1006  NA 141  K 4.2  CO2 22  GLUCOSE 100  BUN 13.6  CREATININE 0.8  CALCIUM 9.5  PROT 7.3  ALBUMIN 4.1  AST 17  ALT 9  ALKPHOS 86  BILITOT 0.25    RADIOGRAPHIC STUDIES: I reviewed the imaging study with the patient. I have personally reviewed the radiological images as listed and agreed with the findings in the report. Dg Chest 2 View  04/17/2014   CLINICAL DATA:  Hypo pharyngeal cancer.  Follow-up.  EXAM: CHEST  2 VIEW  COMPARISON:  04/17/2013 and previous  FINDINGS: Heart size is normal. Mediastinal shadows are normal. There  are areas of concern for developing pulmonary masses. In the right midlung, there is a 1.5 cm density. At the right base medially, there is a 1 cm density. At the left base seen behind the heart, there is a 1 cm density. No evidence of pneumonia or effusion. No bony abnormalities.  IMPRESSION: Question 3 developing pulmonary masses worrisome for metastatic disease. CT scan of the chest with contrast asuggested.  These results will be called to the ordering clinician or representative by the Radiologist Assistant, and communication documented in the PACS or zVision Dashboard.   Electronically Signed   By: Nelson Chimes M.D.   On: 04/17/2014 12:31   Ct Soft Tissue Neck W Contrast  04/17/2014   CLINICAL DATA:  Hypo pharyngeal cancer. Restaging following chemotherapy and surgery.  EXAM: CT NECK WITH CONTRAST  TECHNIQUE: Multidetector CT imaging of the neck was performed using the standard protocol following the bolus administration of intravenous contrast.  CONTRAST:  134mL OMNIPAQUE IOHEXOL 300 MG/ML  SOLN  COMPARISON:  04/17/2013 and previous  FINDINGS: Visualized intracranial contents are unremarkable. Both parotid glands are normal. Both submandibular glands are normal. The thyroid gland does not show any significant abnormality.  There has been neck dissection on the right with absent sternocleidomastoid muscle and jugular vein. There are no enlarged lymph nodes on either side of the neck. There is no evidence of residual or recurrent hypopharyngeal mass. Some thickening of the tissues in that region right more than left persists but has not changed. Small incidental soft tissue hemangioma in the left level 2 region remains visible. Scarring at the lung apices again noted.  IMPRESSION: No evidence of residual or recurrent disease. Post treatment changes as outlined above.   Electronically Signed   By: Nelson Chimes M.D.   On: 04/17/2014 13:27   Ct Chest W Contrast  04/18/2014   CLINICAL DATA:  Pulmonary nodule,  history of laryngeal cancer. Evaluate for metastatic disease.  EXAM: CT CHEST WITH CONTRAST  TECHNIQUE: Multidetector CT imaging of the chest was performed during intravenous contrast administration.  CONTRAST:  54mL OMNIPAQUE IOHEXOL 300 MG/ML  SOLN  COMPARISON:  PET-CT 05/23/2012. No prior diagnostic chest CT for review.  FINDINGS: Biapical pleural thickening is noted. Emphysematous changes are noted. There are multiple bilateral diffuse  pulmonary parenchymal nodules, largest abutting the right hemidiaphragm in the right lower lobe measuring 1.5 cm image 43. The largest representative left lower lobe pulmonary nodule measures 1.2 cm image 39. These correspond to the findings at the dissimilar prior exam. No pleural effusion. Central airways are patent, although areas of thin linear presumed secretions are noted within the trachea and mainstem bronchi.  No lytic or sclerotic osseous lesion. Thyroid is normal. Great vessels are normal in caliber. Heart size is normal. Small right hilar lymph node measures 0.7 cm image 23. Incomplete imaging of the upper abdomen demonstrates partial visualization of a previously seen right upper renal pole cortical cyst, incompletely visualized on the last image of the exam, image 55. Visualized aspects of the adrenal glands appear normal but these are incompletely visualized. Soft tissue deformity presumably related to right neck dissection is noted. Thyroid is grossly unremarkable.  IMPRESSION: Diffuse bilateral pulmonary parenchymal nodules highly suspicious for metastatic disease, corresponding to the finding on the prior exam.  Small right hilar lymph node which is nonspecific. Consider PET-CT for global staging and further evaluation.   Electronically Signed   By: Conchita Paris M.D.   On: 04/18/2014 12:09    ASSESSMENT & PLAN:  Multiple lung nodules This is consistent with metastatic disease. We discussed about the role of biopsy and treatment.  Cancer of  hypopharynx Unfortunately, she now presents with metastatic disease. I discussed with her currently she has stage IV disease. We discussed about the role of biopsy. At current stage, she is not curable. I discussed with her standard therapy versus clinical trial. The patient wants to go home and think about it. If she wants treatment now, she would need CT scan of the abdomen and pelvis to complete staging and placement of Infuse-a-Port. Due to her young age, the patient may be a candidate for triple therapy with combination of 5-FU infusion, carboplatin and weekly Erbitux.       All questions were answered. The patient knows to call the clinic with any problems, questions or concerns. I spent 40 minutes counseling the patient face to face. The total time spent in the appointment was 55 minutes and more than 50% was on counseling.     Heath Lark, MD 04/19/2014 9:47 AM

## 2014-04-19 NOTE — Assessment & Plan Note (Signed)
Unfortunately, she now presents with metastatic disease. I discussed with her currently she has stage IV disease. We discussed about the role of biopsy. At current stage, she is not curable. I discussed with her standard therapy versus clinical trial. The patient wants to go home and think about it. If she wants treatment now, she would need CT scan of the abdomen and pelvis to complete staging and placement of Infuse-a-Port. Due to her young age, the patient may be a candidate for triple therapy with combination of 5-FU infusion, carboplatin and weekly Erbitux.

## 2014-04-25 ENCOUNTER — Telehealth: Payer: Self-pay | Admitting: *Deleted

## 2014-04-25 NOTE — Telephone Encounter (Signed)
Pt states she needs to r/s office visit w/ Dr. Alvy Bimler so her husband can attend.

## 2014-04-25 NOTE — Telephone Encounter (Signed)
My next available is on 6/25 at 2 pm

## 2014-04-25 NOTE — Telephone Encounter (Signed)
Pt and husband cannot make it on 6/25 at 2 pm.  I offered 6/29 at 1 pm and she agreed to this date/time. POF sent Dr. Alvy Bimler visit on 6/29 at 1 pm..

## 2014-04-28 ENCOUNTER — Telehealth: Payer: Self-pay | Admitting: Hematology and Oncology

## 2014-04-28 NOTE — Telephone Encounter (Signed)
lmonvm for pt re appt for 6/29 and mailed schedule.

## 2014-05-10 ENCOUNTER — Telehealth: Payer: Self-pay | Admitting: *Deleted

## 2014-05-10 NOTE — Telephone Encounter (Addendum)
Called pt to introduce myself as the oncology nurse navigator that works with Alvy Bimler, explained briefly my role as a member of her Care Team, indicated that I would be joining her during her appt with Dr. Alvy Bimler on Monday next week.  She stated her husband will be joining her.  She verbalized understanding and expressed appreciation for my call.  Initiating navigation as L1 patient (new patient) with this encounter.  Gayleen Orem, RN, BSN, [facility] Neck Oncology Navigator [phone]

## 2014-05-13 ENCOUNTER — Encounter: Payer: Self-pay | Admitting: Hematology and Oncology

## 2014-05-13 ENCOUNTER — Telehealth: Payer: Self-pay | Admitting: Hematology and Oncology

## 2014-05-13 ENCOUNTER — Encounter: Payer: Self-pay | Admitting: *Deleted

## 2014-05-13 ENCOUNTER — Ambulatory Visit (HOSPITAL_BASED_OUTPATIENT_CLINIC_OR_DEPARTMENT_OTHER): Payer: Medicare Other | Admitting: Hematology and Oncology

## 2014-05-13 VITALS — BP 149/80 | HR 88 | Temp 97.6°F | Resp 18 | Ht 60.0 in | Wt 130.9 lb

## 2014-05-13 DIAGNOSIS — C139 Malignant neoplasm of hypopharynx, unspecified: Secondary | ICD-10-CM

## 2014-05-13 DIAGNOSIS — R918 Other nonspecific abnormal finding of lung field: Secondary | ICD-10-CM

## 2014-05-13 DIAGNOSIS — C7800 Secondary malignant neoplasm of unspecified lung: Secondary | ICD-10-CM

## 2014-05-13 NOTE — Progress Notes (Signed)
Met with patient and her husband during initial consult with Dr. Alvy Bimler.  Further explained my role as a member of the Care Team, provided contact information, encouraged them to contact me with questions/concerns as treatments/procedures begin.  They indicated understanding.    During conversation with them after visit with Dr. Alvy Bimler, husband stated thy had planned to go through a "detox" procedure that involved a formulation of natural ingredients in water.  Patient indicated she is drinking about 32 oz of "alkaline" water per day in addition to "regular" water.  I asked husband to call me with ingredients of detox formulation and I would relay to Dr. Alvy Bimler for her input.  He later called to say the formula contained extract from "black walnut hulls, worm wood and common clove" in water.   I notified Dr. Alvy Bimler.  Continuing navigation as L1 patient (new patient).  Gayleen Orem, RN, BSN, [facility] Neck Oncology Navigator [phone]

## 2014-05-13 NOTE — Assessment & Plan Note (Signed)
Unfortunately, she now presents with metastatic disease. I discussed with her currently she has stage IV disease. We discussed about the role of biopsy. At current stage, she is not curable. I discussed with her standard therapy versus clinical trial. If she wants treatment now, she would need PET/CT scan to complete staging and placement of Infuse-a-Port. Due to her young age, the patient may be a candidate for triple therapy with combination of 5-FU infusion, carboplatin and weekly Erbitux.

## 2014-05-13 NOTE — Assessment & Plan Note (Signed)
This is consistent with metastatic disease. We discussed about the role of biopsy and treatment. I recommend restaging PET/CT scan and CT-guided biopsy. Once we have results, I can give better informed decision about plan of care.

## 2014-05-13 NOTE — Progress Notes (Signed)
[place] OFFICE PROGRESS NOTE  Patient Care Team: Baruch Goldmann, [place] as PCP - General (Physician Assistant) Thea Silversmith, MD (Radiation Oncology) Jerrell Belfast, MD (Otolaryngology) Heath Lark, MD as Consulting Physician (Hematology and Oncology) Brooks Sailors, RN as Oncology Nurse Navigator (Oncology)  SUMMARY OF ONCOLOGIC HISTORY: Oncology History   Cancer of hypopharynx, base of tongue, squamous cell carcinoma   Primary site: Pharynx - Hypopharynx   Staging method: AJCC 7th Edition   Clinical: Stage III (T2, N1, M0) signed by Thea Silversmith, MD on 10/07/2011 11:50 AM   Summary: Stage III (T2, N1, M0)       Cancer of hypopharynx   07/21/2011 Pathology Neck fine needle aspirate was negative for malignancy.   09/03/2011 Pathology Right neck fine needle aspirate and biopsy confirmed malignancy, squamous cell carcinoma, HPV marginally positive.   09/30/2011 Imaging PET CT scan confirmed right hypopharynx mass with regional lymphadenopathy.   10/28/2011 - 12/17/2011 Radiation Therapy The patient completed radiation therapy.   10/28/2011 - 12/23/2011 Chemotherapy The patient received 3 doses of high dose cisplatin. Her last dose of chemotherapy have 25% dose adjustment due to side effects.   03/16/2012 Imaging Repeat PET scan confirmed near complete response to treatment.   05/23/2012 Imaging Repeat PET/CT scan confirmed recurrence of disease in the right neck lymph node.   06/14/2012 Pathology Pathology confirmed only one lymph node involvement.   06/14/2012 Surgery The patient underwent right neck dissection for residual and recurrence of disease.   04/17/2014 Imaging Chest x-ray show multiple pulmonary nodules. CT scan of the neck showed no evidence of recurrence of disease.   04/18/2014 Imaging CT chest confirmed multiple pulmonary nodules highly suspicious for metastatic disease.    INTERVAL HISTORY: Please see below for problem oriented charting. She has no new  symptoms. Denies cough or chest pain.  REVIEW OF SYSTEMS:   Constitutional: Denies fevers, chills or abnormal weight loss Eyes: Denies blurriness of vision Ears, nose, mouth, throat, and face: Denies mucositis or sore throat Respiratory: Denies cough, dyspnea or wheezes Cardiovascular: Denies palpitation, chest discomfort or lower extremity swelling Gastrointestinal:  Denies nausea, heartburn or change in bowel habits Skin: Denies abnormal skin rashes Lymphatics: Denies new lymphadenopathy or easy bruising Neurological:Denies numbness, tingling or new weaknesses Behavioral/Psych: Mood is stable, no new changes  All other systems were reviewed with the patient and are negative.  I have reviewed the past medical history, past surgical history, social history and family history with the patient and they are unchanged from previous note.  ALLERGIES:  has No Known Allergies.  MEDICATIONS:  Current Outpatient Prescriptions  Medication Sig Dispense Refill  . calcium carbonate (OS-CAL) 1250 MG chewable tablet Chew 1 tablet by mouth daily.       . Cholecalciferol (VITAMIN D) 2000 UNITS CAPS Take 2,000 Units by mouth daily.       . fexofenadine-pseudoephedrine (ALLEGRA-D 24) 180-240 MG per 24 hr tablet Take 1 tablet by mouth daily as needed. For allergies      . omeprazole (PRILOSEC) 40 MG capsule Take 40 mg by mouth daily before breakfast.       . OVER THE COUNTER MEDICATION Take 1 tablet by mouth daily. Vitamin B17      . Polyethyl Glycol-Propyl Glycol (SYSTANE OP) Place 1 drop into both eyes 3 (three) times daily as needed. For allergies      . pravastatin (PRAVACHOL) 40 MG tablet Take 40 mg by mouth daily.       No current facility-administered  medications for this visit.    PHYSICAL EXAMINATION: ECOG PERFORMANCE STATUS: 0 - Asymptomatic  Filed Vitals:   05/13/14 1251  BP: 149/80  Pulse: 88  Temp: 97.6 F (36.4 C)  Resp: 18   Filed Weights   05/13/14 1251  Weight: 130 lb 14.4  oz (59.376 kg)    GENERAL:alert, no distress and comfortable SKIN: skin color, texture, turgor are normal, no rashes or significant lesions EYES: normal, Conjunctiva are pink and non-injected, sclera clear OROPHARYNX:no exudate, no erythema and lips, buccal mucosa, and tongue normal  NECK: Well-healed surgical scar. Musculoskeletal:no cyanosis of digits and no clubbing  NEURO: alert & oriented x 3 with fluent speech, no focal motor/sensory deficits  LABORATORY DATA:  I have reviewed the data as listed    Component Value Date/Time   NA 141 04/17/2014 1006   NA 140 06/07/2012 1241   K 4.2 04/17/2014 1006   K 3.9 06/07/2012 1241   CL 103 04/17/2013 0803   CL 103 06/07/2012 1241   CO2 22 04/17/2014 1006   CO2 28 06/07/2012 1241   GLUCOSE 100 04/17/2014 1006   GLUCOSE 78 04/17/2013 0803   GLUCOSE 91 06/07/2012 1241   BUN 13.6 04/17/2014 1006   BUN 16 06/07/2012 1241   CREATININE 0.8 04/17/2014 1006   CREATININE 1.05 06/07/2012 1241   CALCIUM 9.5 04/17/2014 1006   CALCIUM 9.3 06/07/2012 1241   PROT 7.3 04/17/2014 1006   PROT 6.3 03/17/2012 0824   ALBUMIN 4.1 04/17/2014 1006   ALBUMIN 4.1 03/17/2012 0824   AST 17 04/17/2014 1006   AST 16 03/17/2012 0824   ALT 9 04/17/2014 1006   ALT <8 03/17/2012 0824   ALKPHOS 86 04/17/2014 1006   ALKPHOS 61 03/17/2012 0824   BILITOT 0.25 04/17/2014 1006   BILITOT 0.2* 03/17/2012 0824   GFRNONAA 55* 06/07/2012 1241   GFRAA 64* 06/07/2012 1241    No results found for this basename: SPEP, UPEP,  kappa and lambda light chains    Lab Results  Component Value Date   WBC 4.0 04/17/2014   NEUTROABS 2.3 04/17/2014   HGB 12.5 04/17/2014   HCT 38.8 04/17/2014   MCV 92.4 04/17/2014   PLT 234 04/17/2014      Chemistry      Component Value Date/Time   NA 141 04/17/2014 1006   NA 140 06/07/2012 1241   K 4.2 04/17/2014 1006   K 3.9 06/07/2012 1241   CL 103 04/17/2013 0803   CL 103 06/07/2012 1241   CO2 22 04/17/2014 1006   CO2 28 06/07/2012 1241   BUN 13.6 04/17/2014 1006   BUN 16 06/07/2012 1241    CREATININE 0.8 04/17/2014 1006   CREATININE 1.05 06/07/2012 1241      Component Value Date/Time   CALCIUM 9.5 04/17/2014 1006   CALCIUM 9.3 06/07/2012 1241   ALKPHOS 86 04/17/2014 1006   ALKPHOS 61 03/17/2012 0824   AST 17 04/17/2014 1006   AST 16 03/17/2012 0824   ALT 9 04/17/2014 1006   ALT <8 03/17/2012 0824   BILITOT 0.25 04/17/2014 1006   BILITOT 0.2* 03/17/2012 0824       RADIOGRAPHIC STUDIES: I reviewed the CT scan with the patient and her husband. I have personally reviewed the radiological images as listed and agreed with the findings in the report.   ASSESSMENT & PLAN:  Cancer of hypopharynx This is consistent with metastatic disease. We discussed about the role of biopsy and treatment. I recommend restaging PET/CT scan and  CT-guided biopsy. Once we have results, I can give better informed decision about plan of care.   Multiple lung nodules Unfortunately, she now presents with metastatic disease. I discussed with her currently she has stage IV disease. We discussed about the role of biopsy. At current stage, she is not curable. I discussed with her standard therapy versus clinical trial. If she wants treatment now, she would need PET/CT scan to complete staging and placement of Infuse-a-Port. Due to her young age, the patient may be a candidate for triple therapy with combination of 5-FU infusion, carboplatin and weekly Erbitux.     Orders Placed This Encounter  Procedures  . NM PET Image Restag (PS) Skull Base To Thigh    Standing Status: Future     Number of Occurrences:      Standing Expiration Date: 07/13/2015    Order Specific Question:  Reason for Exam (SYMPTOM  OR DIAGNOSIS REQUIRED)    Answer:  staging metastatic cancer tongue base to lung    Order Specific Question:  Preferred imaging location?    Answer:  [facility]  . CT Biopsy    Standing Status: Future     Number of Occurrences:      Standing Expiration Date: 05/13/2015    Order Specific Question:   Reason for Exam (SYMPTOM  OR DIAGNOSIS REQUIRED)    Answer:  biopsy of lung nodule, right lung    Order Specific Question:  Preferred imaging location?    Answer:  [facility]   All questions were answered. The patient knows to call the clinic with any problems, questions or concerns. No barriers to learning was detected. I spent 25 minutes counseling the patient face to face. The total time spent in the appointment was 30 minutes and more than 50% was on counseling and review of test results     [facility], [place], MD 05/13/2014 2:17 PM

## 2014-05-13 NOTE — Telephone Encounter (Signed)
Gave pt appt for MD for july 2015

## 2014-05-21 ENCOUNTER — Ambulatory Visit (HOSPITAL_COMMUNITY)
Admission: RE | Admit: 2014-05-21 | Discharge: 2014-05-21 | Disposition: A | Discharge status: Home / Self Care | Payer: Medicare Other | Source: Ambulatory Visit | Attending: Hematology and Oncology | Admitting: Hematology and Oncology

## 2014-05-21 ENCOUNTER — Other Ambulatory Visit: Payer: Self-pay | Admitting: Hematology and Oncology

## 2014-05-21 DIAGNOSIS — C139 Malignant neoplasm of hypopharynx, unspecified: Secondary | ICD-10-CM

## 2014-05-21 DIAGNOSIS — R918 Other nonspecific abnormal finding of lung field: Secondary | ICD-10-CM | POA: Insufficient documentation

## 2014-05-21 DIAGNOSIS — D259 Leiomyoma of uterus, unspecified: Secondary | ICD-10-CM | POA: Insufficient documentation

## 2014-05-21 DIAGNOSIS — C029 Malignant neoplasm of tongue, unspecified: Secondary | ICD-10-CM | POA: Insufficient documentation

## 2014-05-21 DIAGNOSIS — N281 Cyst of kidney, acquired: Secondary | ICD-10-CM | POA: Insufficient documentation

## 2014-05-21 LAB — GLUCOSE, CAPILLARY: Glucose-Capillary: 98 mg/dL (ref 70–99)

## 2014-05-21 MED ORDER — FLUDEOXYGLUCOSE F - 18 (FDG) INJECTION
6.7000 | Freq: Once | INTRAVENOUS | Status: AC | PRN
  Administered 2014-05-21: 6.7 via INTRAVENOUS

## 2014-05-22 ENCOUNTER — Telehealth: Payer: Self-pay | Admitting: *Deleted

## 2014-05-22 NOTE — Telephone Encounter (Signed)
Left VM for pt her appt will be moved to Thursday at 3:30 pm and to please call nurse back to confirm.

## 2014-05-22 NOTE — Telephone Encounter (Signed)
Radiology Scheduling state first available date for Biopsy is Monday 05/27/14.   Pt also scheduled to see Dr. Alvy Bimler on 7/13 in afternoon.   CT at 9 am and Dr. Alvy Bimler at 2:30 pm.   Does office visit need to be rescheduled?

## 2014-05-22 NOTE — Telephone Encounter (Signed)
Informed pt of appt moved to Thursday at 3:30 pm w/ Dr. Alvy Bimler.  She verbalized understanding.

## 2014-05-22 NOTE — Telephone Encounter (Signed)
Can she come on Thursday 7/16 at 330 pm?

## 2014-05-23 ENCOUNTER — Ambulatory Visit (HOSPITAL_COMMUNITY): Admission: RE | Admit: 2014-05-23 | Payer: Medicare Other | Source: Ambulatory Visit

## 2014-05-23 ENCOUNTER — Ambulatory Visit (HOSPITAL_COMMUNITY): Payer: Self-pay

## 2014-05-23 ENCOUNTER — Telehealth: Payer: Self-pay | Admitting: Hematology and Oncology

## 2014-05-23 NOTE — Telephone Encounter (Signed)
Per 07/08 POF no availability at 3:30, lft msg for pt advising of new time schedule and mailed ltr....Marland KitchenMarland KitchenKJ

## 2014-05-24 ENCOUNTER — Other Ambulatory Visit: Payer: Self-pay | Admitting: Radiology

## 2014-05-27 ENCOUNTER — Ambulatory Visit: Payer: Self-pay | Admitting: Hematology and Oncology

## 2014-05-27 ENCOUNTER — Encounter (HOSPITAL_COMMUNITY): Payer: Self-pay

## 2014-05-27 ENCOUNTER — Ambulatory Visit (HOSPITAL_COMMUNITY)
Admission: RE | Admit: 2014-05-27 | Discharge: 2014-05-27 | Disposition: A | Discharge status: Home / Self Care | Payer: Medicare Other | Source: Ambulatory Visit | Attending: Interventional Radiology | Admitting: Interventional Radiology

## 2014-05-27 ENCOUNTER — Ambulatory Visit (HOSPITAL_COMMUNITY)
Admission: RE | Admit: 2014-05-27 | Discharge: 2014-05-27 | Disposition: A | Discharge status: Home / Self Care | Payer: Medicare Other | Source: Ambulatory Visit | Attending: Hematology and Oncology | Admitting: Hematology and Oncology

## 2014-05-27 VITALS — BP 118/79 | HR 72 | Temp 98.7°F | Resp 11 | Ht 60.0 in | Wt 130.0 lb

## 2014-05-27 DIAGNOSIS — R918 Other nonspecific abnormal finding of lung field: Secondary | ICD-10-CM

## 2014-05-27 DIAGNOSIS — Z9889 Other specified postprocedural states: Secondary | ICD-10-CM

## 2014-05-27 DIAGNOSIS — C7800 Secondary malignant neoplasm of unspecified lung: Secondary | ICD-10-CM | POA: Insufficient documentation

## 2014-05-27 DIAGNOSIS — C329 Malignant neoplasm of larynx, unspecified: Secondary | ICD-10-CM | POA: Insufficient documentation

## 2014-05-27 LAB — PROTIME-INR
INR: 0.93 (ref 0.00–1.49)
PROTHROMBIN TIME: 12.5 s (ref 11.6–15.2)

## 2014-05-27 LAB — APTT: aPTT: 38 seconds — ABNORMAL HIGH (ref 24–37)

## 2014-05-27 LAB — CBC
HEMATOCRIT: 38 % (ref 36.0–46.0)
HEMOGLOBIN: 12.3 g/dL (ref 12.0–15.0)
MCH: 29.7 pg (ref 26.0–34.0)
MCHC: 32.4 g/dL (ref 30.0–36.0)
MCV: 91.8 fL (ref 78.0–100.0)
Platelets: 231 10*3/uL (ref 150–400)
RBC: 4.14 MIL/uL (ref 3.87–5.11)
RDW: 15 % (ref 11.5–15.5)
WBC: 4 10*3/uL (ref 4.0–10.5)

## 2014-05-27 MED ORDER — SODIUM CHLORIDE 0.9 % IV SOLN
INTRAVENOUS | Status: DC
  Administered 2014-05-27: 1000 mL via INTRAVENOUS

## 2014-05-27 MED ORDER — FENTANYL CITRATE 0.05 MG/ML IJ SOLN
INTRAMUSCULAR | Status: AC
  Filled 2014-05-27: qty 4

## 2014-05-27 MED ORDER — MIDAZOLAM HCL 2 MG/2ML IJ SOLN
INTRAMUSCULAR | Status: AC | PRN
  Administered 2014-05-27: .25 mg via INTRAVENOUS
  Administered 2014-05-27: 1 mg via INTRAVENOUS
  Administered 2014-05-27: 0.5 mg via INTRAVENOUS

## 2014-05-27 MED ORDER — LIDOCAINE HCL 1 % IJ SOLN
INTRAMUSCULAR | Status: AC
  Filled 2014-05-27: qty 10

## 2014-05-27 MED ORDER — MIDAZOLAM HCL 2 MG/2ML IJ SOLN
INTRAMUSCULAR | Status: AC
  Filled 2014-05-27: qty 4

## 2014-05-27 MED ORDER — FENTANYL CITRATE 0.05 MG/ML IJ SOLN
INTRAMUSCULAR | Status: AC | PRN
  Administered 2014-05-27: 50 ug via INTRAVENOUS
  Administered 2014-05-27 (×2): 12.5 ug via INTRAVENOUS

## 2014-05-27 NOTE — Discharge Instructions (Signed)
Needle Biopsy of Lung, Care After Refer to this sheet in the next few weeks. These instructions provide you with information on caring for yourself after your procedure. Your health care provider may also give you more specific instructions. Your treatment has been planned according to current medical practices, but problems sometimes occur. Call your health care provider if you have any problems or questions after your procedure. WHAT TO EXPECT AFTER THE PROCEDURE A bandage will be applied over the area where the needle was inserted. You may be asked to apply pressure to the bandage for several minutes to ensure there is minimal bleeding. In most cases, you can leave when your needle biopsy procedure is completed. Do not drive yourself home. Someone else should take you home. If you received an IV sedative or general anesthetic, you will be taken to a comfortable place to relax while the medication wears off. If you have upcoming travel scheduled, talk to your doctor about when it is safe to travel by air after the procedure. HOME CARE INSTRUCTIONS Expect to take it easy for the rest of the day. Protect the area where you received the needle biopsy by keeping the bandage in place for as long as instructed. You may feel some mild pain or discomfort in the area, but this should stop in a day or two. Only take over-the-counter or prescription medicines for pain, discomfort, or fever as directed by your health care provider. SEEK MEDICAL CARE IF:   You have pain at the biopsy site that worsens or is not helped by medication.  You have swelling or drainage at the needle biopsy site.  You have a fever. SEEK IMMEDIATE MEDICAL CARE IF:   You have new or worsening shortness of breath.  You have chest pain.  You are coughing up blood.  You have bleeding that does not stop with pressure or a bandage.  You develop light-headedness or fainting. MAKE SURE YOU:  Understand these instructions.  Will  watch your condition.  Will get help right away if you are not doing well or get worse. Document Released: 08/29/2007 Document Revised: 11/06/2013 Document Reviewed: 03/26/2013 [facility] Patient Information 2015 [place], [place]. This information is not intended to replace advice given to you by your health care provider. Make sure you discuss any questions you have with your health care provider.

## 2014-05-27 NOTE — Progress Notes (Signed)
Per Dr Vernard Gambles ok to ambulate to bathroom and ok to d/c at 1300

## 2014-05-27 NOTE — H&P (Signed)
Sherry Craig is an 68 y.o. female.   Chief Complaint: Pt with known hx of laryngeal cancer 2012 Was scheduled for routine follow up and studies performed CT Chest 04/18/14 revealed B pulmonary nodules 7/7 +PET Now scheduled for R LL mass biopsy  HPI: HLD; laryngeal ca; GERD  Past Medical History  Diagnosis Date  . Hyperlipemia   . Allergy   . Recurrent sinus infections   . 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 Tongue cancer   . GERD (gastroesophageal reflux disease)   . Osteopenia   . 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 Sinus infection     History of  . S/P radiation therapy 10/28/2011 - 12/17/2011    70 Gy to right Hypopharynx, involved lymph nodes, uninvolved lymph nodes  . Status post chemotherapy Started 10/28/2011    Q3Wk Cisplatin concurrent with Radiation Therapy  . H/O exercise stress test     done  >15 yrs. ago due to abnormal EKG.  Stress test was wnl, no need for f/u   . Arthritis     hands & feet     Past Surgical History  Procedure Laterality Date  . Tubal ligation    . Fine needle aspiration  09/03/11    FNA to right Neck Mass - Squamous Cell Carcinoma of the Right Hypopharynx  . Refractive surgery      for retinal tears- seen by Dr. Baird Cancer   . Diagnostic laryngoscopy      done by Dr. Wilburn Cornelia- 07/2011- to  be done for diagnosis of CA of hypopharynyx   . Radical neck dissection  06/13/2012    Procedure: RADICAL NECK DISSECTION;  Surgeon: Jerrell Belfast, MD;  Location: [facility] OR;  Service: ENT;  Laterality: Right;  Right Neck Dissection     Family History  Problem Relation Age of Onset  . Coronary artery disease Mother   . Heart disease Mother   . Hypertension Mother   . Heart attack Mother   . Pulmonary embolism Sister   . Cancer Maternal Aunt     bladder ca   Social History:  reports that she quit smoking about 4 years ago. Her smoking use included Cigarettes. She has a 30 pack-year smoking history. She has  never used smokeless tobacco. She reports that she drinks about 1.2 ounces of alcohol per week. She reports that she does not use illicit drugs.  Allergies: No Known Allergies   (Not in a hospital admission)  Results for orders placed during the hospital encounter of 05/27/14 (from the past 48 hour(s))  APTT     Status: Abnormal   Collection Time    05/27/14  7:52 AM      Result Value Ref Range   aPTT 38 (*) 24 - 37 seconds   Comment:            IF BASELINE aPTT IS ELEVATED,     SUGGEST PATIENT RISK ASSESSMENT     BE USED TO DETERMINE APPROPRIATE     ANTICOAGULANT THERAPY.  CBC     Status: None   Collection Time    05/27/14  7:52 AM      Result Value Ref Range   WBC 4.0  4.0 - 10.5 K/uL   RBC 4.14  3.87 - 5.11 MIL/uL   Hemoglobin 12.3  12.0 - 15.0 g/dL   HCT 38.0  36.0 - 46.0 %   MCV 91.8  78.0 - 100.0 fL   MCH  29.7  26.0 - 34.0 pg   MCHC 32.4  30.0 - 36.0 g/dL   RDW 15.0  11.5 - 15.5 %   Platelets 231  150 - 400 K/uL  PROTIME-INR     Status: None   Collection Time    05/27/14  7:52 AM      Result Value Ref Range   Prothrombin Time 12.5  11.6 - 15.2 seconds   INR 0.93  0.00 - 1.49   No results found.  Review of Systems  Constitutional: Negative for fever and weight loss.  Respiratory: Negative for cough and shortness of breath.   Cardiovascular: Positive for chest pain.       Noticed Rt sided pain toward posterior back just last few days  Gastrointestinal: Negative for nausea and vomiting.  Musculoskeletal: Negative for neck pain.  Neurological: Negative for weakness and headaches.  Psychiatric/Behavioral: Negative for substance abuse.    Blood pressure 114/65, pulse 70, temperature 98.1 F (36.7 C), temperature source Oral, resp. rate 20, height 5' (1.524 m), weight 58.968 kg (130 lb), SpO2 98.00%. Physical Exam  Constitutional: She is oriented to person, place, and time.  Cardiovascular: Normal rate and regular rhythm.   No murmur heard. Respiratory: Effort  normal and breath sounds normal. She has no wheezes.  GI: Soft. Bowel sounds are normal. There is no tenderness.  Musculoskeletal: Normal range of motion.  Neurological: She is alert and oriented to person, place, and time.  Skin: Skin is warm and dry.  Psychiatric: She has a normal mood and affect. Her behavior is normal. Judgment and thought content normal.     Assessment/Plan Hx laryngeal ca Routine follow up studies show B pulm nodules; +PET Now scheduled for RLL mass biopsy Pt and family aware of procedure benefits and risks and agreeable to proceed Consent signed and in chart  [place] A 05/27/2014, 8:49 AM

## 2014-05-27 NOTE — Procedures (Signed)
CT biopsy FNA RLL nodule 22g x3 to cyto No complication, no ptx. No blood loss. See complete dictation in [facility].

## 2014-05-30 ENCOUNTER — Encounter: Payer: Self-pay | Admitting: *Deleted

## 2014-05-30 ENCOUNTER — Ambulatory Visit (HOSPITAL_BASED_OUTPATIENT_CLINIC_OR_DEPARTMENT_OTHER): Payer: Medicare Other | Admitting: Hematology and Oncology

## 2014-05-30 ENCOUNTER — Encounter: Payer: Self-pay | Admitting: Hematology and Oncology

## 2014-05-30 VITALS — BP 140/77 | HR 89 | Temp 97.1°F | Resp 18 | Ht 61.0 in | Wt 131.9 lb

## 2014-05-30 DIAGNOSIS — C139 Malignant neoplasm of hypopharynx, unspecified: Secondary | ICD-10-CM

## 2014-05-30 DIAGNOSIS — R918 Other nonspecific abnormal finding of lung field: Secondary | ICD-10-CM

## 2014-05-30 NOTE — Assessment & Plan Note (Signed)
CT-guided biopsy and PET/CT scan from for malignancy. Overall, she is symptomatic now.

## 2014-05-30 NOTE — Assessment & Plan Note (Signed)
We have extensive discussions today. The review with her the current guidelines. We discussed various chemotherapeutic options. The patient understood that the role of chemotherapy is palliative only. The burden of disease is low and I suggested even the possibility of watchful waiting versus proceeding with chemotherapy now. If she elected to proceed with treatment now, she will need placement of port. I printed the current guidelines and discussed various options with her. I suggested the patient to go home and think about it and discuss with family members. She will call me early next week to inform me of her decision.

## 2014-05-30 NOTE — Progress Notes (Signed)
[facility] OFFICE PROGRESS NOTE  Patient Care Team: Baruch Goldmann, [place] as PCP - General (Physician Assistant) Thea Silversmith, MD (Radiation Oncology) Jerrell Belfast, MD (Otolaryngology) Heath Lark, MD as Consulting Physician (Hematology and Oncology) Brooks Sailors, RN as Oncology Nurse Navigator (Oncology)  SUMMARY OF ONCOLOGIC HISTORY: Oncology History   Cancer of hypopharynx, base of tongue, squamous cell carcinoma, HPV focally positive   Primary site: Pharynx - Hypopharynx   Staging method: AJCC 7th Edition   Clinical: Stage III (T2, N1, M0) signed by Thea Silversmith, MD on 10/07/2011 11:50 AM   Summary: Stage III (T2, N1, M0)       Cancer of hypopharynx   07/21/2011 Pathology Neck fine needle aspirate was negative for malignancy.   09/03/2011 Pathology Right neck fine needle aspirate and biopsy confirmed malignancy, squamous cell carcinoma, HPV marginally positive.   09/30/2011 Imaging PET CT scan confirmed right hypopharynx mass with regional lymphadenopathy.   10/28/2011 - 12/17/2011 Radiation Therapy The patient completed radiation therapy.   10/28/2011 - 12/23/2011 Chemotherapy The patient received 3 doses of high dose cisplatin. Her last dose of chemotherapy have 25% dose adjustment due to side effects.   03/16/2012 Imaging Repeat PET scan confirmed near complete response to treatment.   05/23/2012 Imaging Repeat PET/CT scan confirmed recurrence of disease in the right neck lymph node.   06/14/2012 Pathology Pathology confirmed only one lymph node involvement.   06/14/2012 Surgery The patient underwent right neck dissection for residual and recurrence of disease.   04/17/2014 Imaging Chest x-ray show multiple pulmonary nodules. CT scan of the neck showed no evidence of recurrence of disease.   04/18/2014 Imaging CT chest confirmed multiple pulmonary nodules highly suspicious for metastatic disease.   05/21/2014 Imaging PET CT scan showed multiple pulmonary nodules which  are hypermetabolic.   05/27/2014 Procedure [id number] CT-guided biopsy confirms malignant cells, compatible with squamous cell carcinoma.    INTERVAL HISTORY: Please see below for problem oriented charting. She returns today to review test results regarding CT-guided biopsy and PET scan. She denies any new symptoms.  REVIEW OF SYSTEMS:   Constitutional: Denies fevers, chills or abnormal weight loss Eyes: Denies blurriness of vision Ears, nose, mouth, throat, and face: Denies mucositis or sore throat Respiratory: Denies cough, dyspnea or wheezes Cardiovascular: Denies palpitation, chest discomfort or lower extremity swelling Gastrointestinal:  Denies nausea, heartburn or change in bowel habits Skin: Denies abnormal skin rashes Lymphatics: Denies new lymphadenopathy or easy bruising Neurological:Denies numbness, tingling or new weaknesses Behavioral/Psych: Mood is stable, no new changes  All other systems were reviewed with the patient and are negative.  I have reviewed the past medical history, past surgical history, social history and family history with the patient and they are unchanged from previous note.  ALLERGIES:  has No Known Allergies.  MEDICATIONS:  Current Outpatient Prescriptions  Medication Sig Dispense Refill  . calcium carbonate (OS-CAL) 1250 MG chewable tablet Chew 1 tablet by mouth daily.       . Cholecalciferol (VITAMIN D) 2000 UNITS CAPS Take 2,000 Units by mouth daily.       . fexofenadine-pseudoephedrine (ALLEGRA-D 24) 180-240 MG per 24 hr tablet Take 1 tablet by mouth daily as needed (for allergies).       Marland Kitchen omeprazole (PRILOSEC) 40 MG capsule Take 40 mg by mouth daily before breakfast.       . OVER THE COUNTER MEDICATION Take 1 tablet by mouth daily. MED NAME: Vitamin B-17      . Polyethyl Glycol-Propyl  Glycol (SYSTANE OP) Place 1 drop into both eyes 3 (three) times daily as needed (for allergies).       . pravastatin (PRAVACHOL) 40 MG tablet Take 40 mg by  mouth daily.       No current facility-administered medications for this visit.    PHYSICAL EXAMINATION: ECOG PERFORMANCE STATUS: 0 - Asymptomatic  Filed Vitals:   05/30/14 1525  BP: 140/77  Pulse: 89  Temp: 97.1 F (36.2 C)  Resp: 18   Filed Weights   05/30/14 1525  Weight: 131 lb 14.4 oz (59.829 kg)    GENERAL:alert, no distress and comfortable SKIN: skin color, texture, turgor are normal, no rashes or significant lesions EYES: normal, Conjunctiva are pink and non-injected, sclera clear NECK: Well healed surgical scar.  Musculoskeletal:no cyanosis of digits and no clubbing  NEURO: alert & oriented x 3 with fluent speech, no focal motor/sensory deficits  LABORATORY DATA:  I have reviewed the data as listed    Component Value Date/Time   NA 141 04/17/2014 1006   NA 140 06/07/2012 1241   K 4.2 04/17/2014 1006   K 3.9 06/07/2012 1241   CL 103 04/17/2013 0803   CL 103 06/07/2012 1241   CO2 22 04/17/2014 1006   CO2 28 06/07/2012 1241   GLUCOSE 100 04/17/2014 1006   GLUCOSE 78 04/17/2013 0803   GLUCOSE 91 06/07/2012 1241   BUN 13.6 04/17/2014 1006   BUN 16 06/07/2012 1241   CREATININE 0.8 04/17/2014 1006   CREATININE 1.05 06/07/2012 1241   CALCIUM 9.5 04/17/2014 1006   CALCIUM 9.3 06/07/2012 1241   PROT 7.3 04/17/2014 1006   PROT 6.3 03/17/2012 0824   ALBUMIN 4.1 04/17/2014 1006   ALBUMIN 4.1 03/17/2012 0824   AST 17 04/17/2014 1006   AST 16 03/17/2012 0824   ALT 9 04/17/2014 1006   ALT <8 03/17/2012 0824   ALKPHOS 86 04/17/2014 1006   ALKPHOS 61 03/17/2012 0824   BILITOT 0.25 04/17/2014 1006   BILITOT 0.2* 03/17/2012 0824   GFRNONAA 55* 06/07/2012 1241   GFRAA 64* 06/07/2012 1241    No results found for this basename: SPEP, UPEP,  kappa and lambda light chains    Lab Results  Component Value Date   WBC 4.0 05/27/2014   NEUTROABS 2.3 04/17/2014   HGB 12.3 05/27/2014   HCT 38.0 05/27/2014   MCV 91.8 05/27/2014   PLT 231 05/27/2014      Chemistry      Component Value Date/Time   NA 141 04/17/2014 1006    NA 140 06/07/2012 1241   K 4.2 04/17/2014 1006   K 3.9 06/07/2012 1241   CL 103 04/17/2013 0803   CL 103 06/07/2012 1241   CO2 22 04/17/2014 1006   CO2 28 06/07/2012 1241   BUN 13.6 04/17/2014 1006   BUN 16 06/07/2012 1241   CREATININE 0.8 04/17/2014 1006   CREATININE 1.05 06/07/2012 1241      Component Value Date/Time   CALCIUM 9.5 04/17/2014 1006   CALCIUM 9.3 06/07/2012 1241   ALKPHOS 86 04/17/2014 1006   ALKPHOS 61 03/17/2012 0824   AST 17 04/17/2014 1006   AST 16 03/17/2012 0824   ALT 9 04/17/2014 1006   ALT <8 03/17/2012 0824   BILITOT 0.25 04/17/2014 1006   BILITOT 0.2* 03/17/2012 0824       RADIOGRAPHIC STUDIES: I. reviewed the results an imaging study with her husband and patient I have personally reviewed the radiological images as listed and agreed with  the findings in the report.   ASSESSMENT & PLAN:  Cancer of hypopharynx We have extensive discussions today. The review with her the current guidelines. We discussed various chemotherapeutic options. The patient understood that the role of chemotherapy is palliative only. The burden of disease is low and I suggested even the possibility of watchful waiting versus proceeding with chemotherapy now. If she elected to proceed with treatment now, she will need placement of port. I printed the current guidelines and discussed various options with her. I suggested the patient to go home and think about it and discuss with family members. She will call me early next week to inform me of her decision.  Multiple lung nodules CT-guided biopsy and PET/CT scan from for malignancy. Overall, she is symptomatic now.   No orders of the defined types were placed in this encounter.   All questions were answered. The patient knows to call the clinic with any problems, questions or concerns. No barriers to learning was detected. I spent 40 minutes counseling the patient face to face. The total time spent in the appointment was 55 minutes and more than 50%  was on counseling and review of test results     [facility], [place], MD 05/30/2014 7:59 PM

## 2014-06-01 NOTE — Progress Notes (Signed)
To provide support, encouragement and care continuity, met with [facility] and her husband during est pt appt with Dr. Alvy Bimler.  Reinforced information presented by Dr. Alvy Bimler re: chemotherapy options, furthered their understanding of NCCN guidelines.  Further addressed her concerns about hair loss and use of pump for chemo administration, telling their children about dx and tmts.  Encouraged them to call me with any questions.  Per Dr. Alvy Bimler, she will contact me/Dr. Alvy Bimler on Monday with her decision on chemo choice.  Continuing navigation as L1 patient (new patient).  Gayleen Orem, RN, BSN, [facility] Neck Oncology Navigator [phone]

## 2014-06-03 ENCOUNTER — Telehealth: Payer: Self-pay | Admitting: *Deleted

## 2014-06-03 NOTE — Telephone Encounter (Signed)
Patient left a message that she wants to proceed with treatment she discussed with Dr Alvy Bimler

## 2014-06-04 ENCOUNTER — Other Ambulatory Visit: Payer: Self-pay | Admitting: Hematology and Oncology

## 2014-06-04 DIAGNOSIS — C139 Malignant neoplasm of hypopharynx, unspecified: Secondary | ICD-10-CM

## 2014-06-05 ENCOUNTER — Telehealth: Payer: Self-pay | Admitting: Hematology and Oncology

## 2014-06-05 ENCOUNTER — Other Ambulatory Visit: Payer: Self-pay | Admitting: Hematology and Oncology

## 2014-06-05 NOTE — Telephone Encounter (Signed)
s/w pt re appts for 7/27, 7/28, and 7/29. pt will get schedule when she comes in. per pt IR has already contacted her re port and she has to call them  back tomorow. pt aware that if for any reason port cannot be placed before 7/29 chemo to call us back. i also confirmed # for IR w/her.

## 2014-06-06 ENCOUNTER — Telehealth: Payer: Self-pay | Admitting: *Deleted

## 2014-06-06 ENCOUNTER — Other Ambulatory Visit: Payer: Self-pay | Admitting: Radiology

## 2014-06-06 ENCOUNTER — Other Ambulatory Visit: Payer: Self-pay | Admitting: *Deleted

## 2014-06-06 NOTE — Telephone Encounter (Signed)
Per staff message and POF I have scheduled appts.patietnt to receive calendar in chemo class. JMW

## 2014-06-07 ENCOUNTER — Encounter (HOSPITAL_COMMUNITY): Payer: Self-pay | Admitting: Pharmacy Technician

## 2014-06-10 ENCOUNTER — Encounter: Payer: Self-pay | Admitting: *Deleted

## 2014-06-10 ENCOUNTER — Ambulatory Visit: Payer: Medicare Other

## 2014-06-10 NOTE — Progress Notes (Signed)
Met with patient and her husband in chemotherapy class.  Discussed side effects including but not limited to myelosuppression, acne like rash, skin care, diarrhea, pump care, and portacath information.  Answered patient and husband questions.

## 2014-06-10 NOTE — Patient Instructions (Signed)
Carboplatin injection What is this medicine? CARBOPLATIN (KAR boe pla tin) is a chemotherapy drug. It targets fast dividing cells, like cancer cells, and causes these cells to die. This medicine is used to treat ovarian cancer and many other cancers. This medicine may be used for other purposes; ask your health care provider or pharmacist if you have questions. COMMON BRAND NAME(S): Paraplatin What should I tell my health care provider before I take this medicine? They need to know if you have any of these conditions: -blood disorders -hearing problems -kidney disease -recent or ongoing radiation therapy -an unusual or allergic reaction to carboplatin, cisplatin, other chemotherapy, other medicines, foods, dyes, or preservatives -pregnant or trying to get pregnant -breast-feeding How should I use this medicine? This drug is usually given as an infusion into a vein. It is administered in a hospital or clinic by a specially trained health care professional. Talk to your pediatrician regarding the use of this medicine in children. Special care may be needed. Overdosage: If you think you have taken too much of this medicine contact a poison control center or emergency room at once. NOTE: This medicine is only for you. Do not share this medicine with others. What if I miss a dose? It is important not to miss a dose. Call your doctor or health care professional if you are unable to keep an appointment. What may interact with this medicine? -medicines for seizures -medicines to increase blood counts like filgrastim, pegfilgrastim, sargramostim -some antibiotics like amikacin, gentamicin, neomycin, streptomycin, tobramycin -vaccines Talk to your doctor or health care professional before taking any of these medicines: -acetaminophen -aspirin -ibuprofen -ketoprofen -naproxen This list may not describe all possible interactions. Give your health care provider a list of all the medicines, herbs,  non-prescription drugs, or dietary supplements you use. Also tell them if you smoke, drink alcohol, or use illegal drugs. Some items may interact with your medicine. What should I watch for while using this medicine? Your condition will be monitored carefully while you are receiving this medicine. You will need important blood work done while you are taking this medicine. This drug may make you feel generally unwell. This is not uncommon, as chemotherapy can affect healthy cells as well as cancer cells. Report any side effects. Continue your course of treatment even though you feel ill unless your doctor tells you to stop. In some cases, you may be given additional medicines to help with side effects. Follow all directions for their use. Call your doctor or health care professional for advice if you get a fever, chills or sore throat, or other symptoms of a cold or flu. Do not treat yourself. This drug decreases your body's ability to fight infections. Try to avoid being around people who are sick. This medicine may increase your risk to bruise or bleed. Call your doctor or health care professional if you notice any unusual bleeding. Be careful brushing and flossing your teeth or using a toothpick because you may get an infection or bleed more easily. If you have any dental work done, tell your dentist you are receiving this medicine. Avoid taking products that contain aspirin, acetaminophen, ibuprofen, naproxen, or ketoprofen unless instructed by your doctor. These medicines may hide a fever. Do not become pregnant while taking this medicine. Women should inform their doctor if they wish to become pregnant or think they might be pregnant. There is a potential for serious side effects to an unborn child. Talk to your health care professional or  pharmacist for more information. Do not breast-feed an infant while taking this medicine. What side effects may I notice from receiving this medicine? Side effects  that you should report to your doctor or health care professional as soon as possible: -allergic reactions like skin rash, itching or hives, swelling of the face, lips, or tongue -signs of infection - fever or chills, cough, sore throat, pain or difficulty passing urine -signs of decreased platelets or bleeding - bruising, pinpoint red spots on the skin, black, tarry stools, nosebleeds -signs of decreased red blood cells - unusually weak or tired, fainting spells, lightheadedness -breathing problems -changes in hearing -changes in vision -chest pain -high blood pressure -low blood counts - This drug may decrease the number of white blood cells, red blood cells and platelets. You may be at increased risk for infections and bleeding. -nausea and vomiting -pain, swelling, redness or irritation at the injection site -pain, tingling, numbness in the hands or feet -problems with balance, talking, walking -trouble passing urine or change in the amount of urine Side effects that usually do not require medical attention (report to your doctor or health care professional if they continue or are bothersome): -hair loss -loss of appetite -metallic taste in the mouth or changes in taste This list may not describe all possible side effects. Call your doctor for medical advice about side effects. You may report side effects to FDA at 1-800-FDA-1088. Where should I keep my medicine? This drug is given in a hospital or clinic and will not be stored at home. NOTE: This sheet is a summary. It may not cover all possible information. If you have questions about this medicine, talk to your doctor, pharmacist, or health care provider.  2015, [employer]. (2008-02-06 14:38:05) Cetuximab injection What is this medicine? CETUXIMAB (se TUX i mab) is a chemotherapy drug. It targets a specific protein within cancer cells and stops the cells from growing. It is used to treat colorectal cancer and head and  neck cancer. This medicine may be used for other purposes; ask your health care provider or pharmacist if you have questions. COMMON BRAND NAME(S): Erbitux What should I tell my health care provider before I take this medicine? They need to know if you have any of these conditions: -heart disease -history of irregular heartbeat -history of low levels of calcium, magnesium, or potassium in the blood -lung or breathing disease, like asthma -an unusual or allergic reaction to cetuximab, other medicines, foods, dyes, or preservatives -pregnant or trying to get pregnant -breast-feeding How should I use this medicine? This drug is given as an infusion into a vein. It is administered in a hospital or clinic by a specially trained health care professional. Talk to your pediatrician regarding the use of this medicine in children. Special care may be needed. Overdosage: If you think you have taken too much of this medicine contact a poison control center or emergency room at once. NOTE: This medicine is only for you. Do not share this medicine with others. What if I miss a dose? It is important not to miss your dose. Call your doctor or health care professional if you are unable to keep an appointment. What may interact with this medicine? Interactions are not expected. This list may not describe all possible interactions. Give your health care provider a list of all the medicines, herbs, non-prescription drugs, or dietary supplements you use. Also tell them if you smoke, drink alcohol, or use illegal drugs. Some items may interact  with your medicine. What should I watch for while using this medicine? Visit your doctor or health care professional for regular checks on your progress. This drug may make you feel generally unwell. This is not uncommon, as chemotherapy can affect healthy cells as well as cancer cells. Report any side effects. Continue your course of treatment even though you feel ill unless  your doctor tells you to stop. This medicine can make you more sensitive to the sun. Keep out of the sun while taking this medicine and for 2 months after the last dose. If you cannot avoid being in the sun, wear protective clothing and use sunscreen. Do not use sun lamps or tanning beds/booths. You may need blood work done while you are taking this medicine. In some cases, you may be given additional medicines to help with side effects. Follow all directions for their use. Call your doctor or health care professional for advice if you get a fever, chills or sore throat, or other symptoms of a cold or flu. Do not treat yourself. This drug decreases your body's ability to fight infections. Try to avoid being around people who are sick. Avoid taking products that contain aspirin, acetaminophen, ibuprofen, naproxen, or ketoprofen unless instructed by your doctor. These medicines may hide a fever. Do not become pregnant while taking this medicine. Women should inform their doctor if they wish to become pregnant or think they might be pregnant. There is a potential for serious side effects to an unborn child. Use adequate birth control methods. Avoid pregnancy for at least 6 months after your last dose. Talk to your health care professional or pharmacist for more information. Do not breast-feed an infant while taking this medicine or during the 2 months after your last dose. What side effects may I notice from receiving this medicine? Side effects that you should report to your doctor or health care professional as soon as possible: -allergic reactions like skin rash, itching or hives, swelling of the face, lips, or tongue -breathing problems -changes in vision -fast, irregular heartbeat -feeling faint or lightheaded, falls -fever, chills -mouth sores -redness, blistering, peeling or loosening of the skin, including inside the mouth -trouble passing urine or change in the amount of urine -unusually weak  or tired Side effects that usually do not require medical attention (report to your doctor or health care professional if they continue or are bothersome): -changes in skin like acne, cracks, skin dryness -constipation -diarrhea -headache -nail changes -nausea, vomiting -stomach upset -weight loss This list may not describe all possible side effects. Call your doctor for medical advice about side effects. You may report side effects to FDA at 1-800-FDA-1088. Where should I keep my medicine? This drug is given in a hospital or clinic and will not be stored at home. NOTE: This sheet is a summary. It may not cover all possible information. If you have questions about this medicine, talk to your doctor, pharmacist, or health care provider.  2015, [employer]. (2014-02-13 16:14:34) Fluorouracil, 5-FU injection What is this medicine? FLUOROURACIL, 5-FU (flure oh YOOR a sil) is a chemotherapy drug. It slows the growth of cancer cells. This medicine is used to treat many types of cancer like breast cancer, colon or rectal cancer, pancreatic cancer, and stomach cancer. This medicine may be used for other purposes; ask your health care provider or pharmacist if you have questions. COMMON BRAND NAME(S): Adrucil What should I tell my health care provider before I take this medicine? They need  to know if you have any of these conditions: -blood disorders -dihydropyrimidine dehydrogenase (DPD) deficiency -infection (especially a virus infection such as chickenpox, cold sores, or herpes) -kidney disease -liver disease -malnourished, poor nutrition -recent or ongoing radiation therapy -an unusual or allergic reaction to fluorouracil, other chemotherapy, other medicines, foods, dyes, or preservatives -pregnant or trying to get pregnant -breast-feeding How should I use this medicine? This drug is given as an infusion or injection into a vein. It is administered in a hospital or clinic by a  specially trained health care professional. Talk to your pediatrician regarding the use of this medicine in children. Special care may be needed. Overdosage: If you think you have taken too much of this medicine contact a poison control center or emergency room at once. NOTE: This medicine is only for you. Do not share this medicine with others. What if I miss a dose? It is important not to miss your dose. Call your doctor or health care professional if you are unable to keep an appointment. What may interact with this medicine? -allopurinol -cimetidine -dapsone -digoxin -hydroxyurea -leucovorin -levamisole -medicines for seizures like ethotoin, fosphenytoin, phenytoin -medicines to increase blood counts like filgrastim, pegfilgrastim, sargramostim -medicines that treat or prevent blood clots like warfarin, enoxaparin, and dalteparin -methotrexate -metronidazole -pyrimethamine -some other chemotherapy drugs like busulfan, cisplatin, estramustine, vinblastine -trimethoprim -trimetrexate -vaccines Talk to your doctor or health care professional before taking any of these medicines: -acetaminophen -aspirin -ibuprofen -ketoprofen -naproxen This list may not describe all possible interactions. Give your health care provider a list of all the medicines, herbs, non-prescription drugs, or dietary supplements you use. Also tell them if you smoke, drink alcohol, or use illegal drugs. Some items may interact with your medicine. What should I watch for while using this medicine? Visit your doctor for checks on your progress. This drug may make you feel generally unwell. This is not uncommon, as chemotherapy can affect healthy cells as well as cancer cells. Report any side effects. Continue your course of treatment even though you feel ill unless your doctor tells you to stop. In some cases, you may be given additional medicines to help with side effects. Follow all directions for their  use. Call your doctor or health care professional for advice if you get a fever, chills or sore throat, or other symptoms of a cold or flu. Do not treat yourself. This drug decreases your body's ability to fight infections. Try to avoid being around people who are sick. This medicine may increase your risk to bruise or bleed. Call your doctor or health care professional if you notice any unusual bleeding. Be careful brushing and flossing your teeth or using a toothpick because you may get an infection or bleed more easily. If you have any dental work done, tell your dentist you are receiving this medicine. Avoid taking products that contain aspirin, acetaminophen, ibuprofen, naproxen, or ketoprofen unless instructed by your doctor. These medicines may hide a fever. Do not become pregnant while taking this medicine. Women should inform their doctor if they wish to become pregnant or think they might be pregnant. There is a potential for serious side effects to an unborn child. Talk to your health care professional or pharmacist for more information. Do not breast-feed an infant while taking this medicine. Men should inform their doctor if they wish to father a child. This medicine may lower sperm counts. Do not treat diarrhea with over the counter products. Contact your doctor if you have diarrhea  that lasts more than 2 days or if it is severe and watery. This medicine can make you more sensitive to the sun. Keep out of the sun. If you cannot avoid being in the sun, wear protective clothing and use sunscreen. Do not use sun lamps or tanning beds/booths. What side effects may I notice from receiving this medicine? Side effects that you should report to your doctor or health care professional as soon as possible: -allergic reactions like skin rash, itching or hives, swelling of the face, lips, or tongue -low blood counts - this medicine may decrease the number of white blood cells, red blood cells and  platelets. You may be at increased risk for infections and bleeding. -signs of infection - fever or chills, cough, sore throat, pain or difficulty passing urine -signs of decreased platelets or bleeding - bruising, pinpoint red spots on the skin, black, tarry stools, blood in the urine -signs of decreased red blood cells - unusually weak or tired, fainting spells, lightheadedness -breathing problems -changes in vision -chest pain -mouth sores -nausea and vomiting -pain, swelling, redness at site where injected -pain, tingling, numbness in the hands or feet -redness, swelling, or sores on hands or feet -stomach pain -unusual bleeding Side effects that usually do not require medical attention (report to your doctor or health care professional if they continue or are bothersome): -changes in finger or toe nails -diarrhea -dry or itchy skin -hair loss -headache -loss of appetite -sensitivity of eyes to the light -stomach upset -unusually teary eyes This list may not describe all possible side effects. Call your doctor for medical advice about side effects. You may report side effects to FDA at 1-800-FDA-1088. Where should I keep my medicine? This drug is given in a hospital or clinic and will not be stored at home. NOTE: This sheet is a summary. It may not cover all possible information. If you have questions about this medicine, talk to your doctor, pharmacist, or health care provider.  2015, [employer]. (2008-03-06 13:53:16)

## 2014-06-11 ENCOUNTER — Encounter: Payer: Self-pay | Admitting: Hematology and Oncology

## 2014-06-11 ENCOUNTER — Other Ambulatory Visit (HOSPITAL_BASED_OUTPATIENT_CLINIC_OR_DEPARTMENT_OTHER): Payer: Medicare Other

## 2014-06-11 ENCOUNTER — Other Ambulatory Visit: Payer: Self-pay

## 2014-06-11 ENCOUNTER — Telehealth: Payer: Self-pay | Admitting: *Deleted

## 2014-06-11 ENCOUNTER — Telehealth: Payer: Self-pay | Admitting: Hematology and Oncology

## 2014-06-11 ENCOUNTER — Ambulatory Visit (HOSPITAL_BASED_OUTPATIENT_CLINIC_OR_DEPARTMENT_OTHER): Payer: Medicare Other | Admitting: Hematology and Oncology

## 2014-06-11 VITALS — BP 143/79 | HR 70 | Temp 98.0°F | Resp 20 | Ht 61.0 in | Wt 131.1 lb

## 2014-06-11 DIAGNOSIS — B977 Papillomavirus as the cause of diseases classified elsewhere: Secondary | ICD-10-CM

## 2014-06-11 DIAGNOSIS — R918 Other nonspecific abnormal finding of lung field: Secondary | ICD-10-CM

## 2014-06-11 DIAGNOSIS — C139 Malignant neoplasm of hypopharynx, unspecified: Secondary | ICD-10-CM

## 2014-06-11 DIAGNOSIS — R911 Solitary pulmonary nodule: Secondary | ICD-10-CM

## 2014-06-11 LAB — COMPREHENSIVE METABOLIC PANEL (CC13)
ALBUMIN: 3.9 g/dL (ref 3.5–5.0)
ALT: 12 U/L (ref 0–55)
ANION GAP: 8 meq/L (ref 3–11)
AST: 18 U/L (ref 5–34)
Alkaline Phosphatase: 75 U/L (ref 40–150)
BUN: 10.8 mg/dL (ref 7.0–26.0)
CALCIUM: 9.3 mg/dL (ref 8.4–10.4)
CHLORIDE: 104 meq/L (ref 98–109)
CO2: 27 mEq/L (ref 22–29)
Creatinine: 0.8 mg/dL (ref 0.6–1.1)
GLUCOSE: 90 mg/dL (ref 70–140)
POTASSIUM: 3.9 meq/L (ref 3.5–5.1)
Sodium: 138 mEq/L (ref 136–145)
Total Bilirubin: 0.35 mg/dL (ref 0.20–1.20)
Total Protein: 7.1 g/dL (ref 6.4–8.3)

## 2014-06-11 LAB — CBC WITH DIFFERENTIAL/PLATELET
BASO%: 0.9 % (ref 0.0–2.0)
BASOS ABS: 0 10*3/uL (ref 0.0–0.1)
EOS%: 2.6 % (ref 0.0–7.0)
Eosinophils Absolute: 0.1 10*3/uL (ref 0.0–0.5)
HEMATOCRIT: 37.9 % (ref 34.8–46.6)
HGB: 12.2 g/dL (ref 11.6–15.9)
LYMPH#: 1.7 10*3/uL (ref 0.9–3.3)
LYMPH%: 33 % (ref 14.0–49.7)
MCH: 29.9 pg (ref 25.1–34.0)
MCHC: 32.1 g/dL (ref 31.5–36.0)
MCV: 93.4 fL (ref 79.5–101.0)
MONO#: 0.4 10*3/uL (ref 0.1–0.9)
MONO%: 8 % (ref 0.0–14.0)
NEUT%: 55.5 % (ref 38.4–76.8)
NEUTROS ABS: 2.8 10*3/uL (ref 1.5–6.5)
Platelets: 272 10*3/uL (ref 145–400)
RBC: 4.06 10*6/uL (ref 3.70–5.45)
RDW: 15.1 % — ABNORMAL HIGH (ref 11.2–14.5)
WBC: 5 10*3/uL (ref 3.9–10.3)

## 2014-06-11 LAB — MAGNESIUM (CC13): MAGNESIUM: 2.3 mg/dL (ref 1.5–2.5)

## 2014-06-11 MED ORDER — LIDOCAINE-PRILOCAINE 2.5-2.5 % EX CREA: 1.0000 "application " | TOPICAL_CREAM | CUTANEOUS | Status: AC | PRN

## 2014-06-11 MED ORDER — PROCHLORPERAZINE MALEATE 10 MG PO TABS: 10.0000 mg | ORAL_TABLET | Freq: Four times a day (QID) | ORAL | Status: DC | PRN

## 2014-06-11 MED ORDER — ONDANSETRON HCL 8 MG PO TABS: 8.0000 mg | ORAL_TABLET | Freq: Three times a day (TID) | ORAL | Status: DC | PRN

## 2014-06-11 NOTE — Telephone Encounter (Signed)
, °

## 2014-06-11 NOTE — Assessment & Plan Note (Signed)
CT-guided biopsy and PET/CT scan from for malignancy. Overall, she is not symptomatic now.

## 2014-06-11 NOTE — Telephone Encounter (Signed)
Per [facility] staff phone call scheduled appts. Advised schedulers

## 2014-06-11 NOTE — Progress Notes (Signed)
[facility] PROGRESS NOTE  Patient Care Team: Baruch Goldmann, [place] as PCP - General (Physician Assistant) Thea Silversmith, MD (Radiation Oncology) Jerrell Belfast, MD (Otolaryngology) Heath Lark, MD as Consulting Physician (Hematology and Oncology) Brooks Sailors, RN as Oncology Nurse Navigator (Oncology)  SUMMARY OF ONCOLOGIC HISTORY: Oncology History   Cancer of hypopharynx, base of tongue, squamous cell carcinoma, HPV focally positive   Primary site: Pharynx - Hypopharynx   Staging method: AJCC 7th Edition   Clinical: Stage III (T2, N1, M0) signed by Thea Silversmith, MD on 10/07/2011 11:50 AM   Summary: Stage III (T2, N1, M0)       Cancer of hypopharynx   07/21/2011 Pathology Results Neck fine needle aspirate was negative for malignancy.   09/03/2011 Pathology Results Right neck fine needle aspirate and biopsy confirmed malignancy, squamous cell carcinoma, HPV marginally positive.   09/30/2011 Imaging PET CT scan confirmed right hypopharynx mass with regional lymphadenopathy.   10/28/2011 - 12/17/2011 Radiation Therapy The patient completed radiation therapy.   10/28/2011 - 12/23/2011 Chemotherapy The patient received 3 doses of high dose cisplatin. Her last dose of chemotherapy have 25% dose adjustment due to side effects.   03/16/2012 Imaging Repeat PET scan confirmed near complete response to treatment.   05/23/2012 Imaging Repeat PET/CT scan confirmed recurrence of disease in the right neck lymph node.   06/14/2012 Pathology Results Pathology confirmed only one lymph node involvement.   06/14/2012 Surgery The patient underwent right neck dissection for residual and recurrence of disease.   04/17/2014 Imaging Chest x-ray show multiple pulmonary nodules. CT scan of the neck showed no evidence of recurrence of disease.   04/18/2014 Imaging CT chest confirmed multiple pulmonary nodules highly suspicious for metastatic disease.   05/21/2014 Imaging PET CT scan showed multiple  pulmonary nodules which are hypermetabolic.   05/27/2014 Procedure [id number] CT-guided biopsy confirms malignant cells, compatible with squamous cell carcinoma.    INTERVAL HISTORY: Please see below for problem oriented charting. She returns today to discuss about chemotherapy. Overall, she is not symptomatic. She has attended chemotherapy education class. Port placement will happen tomorrow followed by first dose chemotherapy on 06/13/2014.  REVIEW OF SYSTEMS:   Constitutional: Denies fevers, chills or abnormal weight loss Eyes: Denies blurriness of vision Ears, nose, mouth, throat, and face: Denies mucositis or sore throat Respiratory: Denies cough, dyspnea or wheezes Cardiovascular: Denies palpitation, chest discomfort or lower extremity swelling Gastrointestinal:  Denies nausea, heartburn or change in bowel habits Skin: Denies abnormal skin rashes Lymphatics: Denies new lymphadenopathy or easy bruising Neurological:Denies numbness, tingling or new weaknesses Behavioral/Psych: Mood is stable, no new changes  All other systems were reviewed with the patient and are negative.  I have reviewed the past medical history, past surgical history, social history and family history with the patient and they are unchanged from previous note.  ALLERGIES:  has No Known Allergies.  MEDICATIONS:  Current Outpatient Prescriptions  Medication Sig Dispense Refill  . calcium carbonate (OS-CAL) 1250 MG chewable tablet Chew 1 tablet by mouth daily.       . Cholecalciferol (VITAMIN D) 2000 UNITS CAPS Take 2,000 Units by mouth daily.       . fexofenadine-pseudoephedrine (ALLEGRA-D 24) 180-240 MG per 24 hr tablet Take 1 tablet by mouth daily as needed (for allergies).       Marland Kitchen omeprazole (PRILOSEC) 40 MG capsule Take 40 mg by mouth daily as needed (Acid reflux).       Marland Kitchen OVER THE COUNTER MEDICATION Take  1 tablet by mouth daily. MED NAME: Vitamin B-17      . Polyethyl Glycol-Propyl Glycol (SYSTANE OP)  Place 1 drop into both eyes 3 (three) times daily as needed (for allergies).       . pravastatin (PRAVACHOL) 40 MG tablet Take 40 mg by mouth daily.      Marland Kitchen lidocaine-prilocaine (EMLA) cream Apply 1 application topically as needed.  30 g  6  . ondansetron (ZOFRAN) 8 MG tablet Take 1 tablet (8 mg total) by mouth every 8 (eight) hours as needed.  60 tablet  1  . prochlorperazine (COMPAZINE) 10 MG tablet Take 1 tablet (10 mg total) by mouth every 6 (six) hours as needed (Nausea or vomiting).  30 tablet  1   No current facility-administered medications for this visit.    PHYSICAL EXAMINATION: ECOG PERFORMANCE STATUS: 0 - Asymptomatic  Filed Vitals:   06/11/14 1249  BP: 143/79  Pulse: 70  Temp: 98 F (36.7 C)  Resp: 20   Filed Weights   06/11/14 1249  Weight: 131 lb 1.6 oz (59.467 kg)    GENERAL:alert, no distress and comfortable SKIN: skin color, texture, turgor are normal, no rashes or significant lesions EYES: normal, Conjunctiva are pink and non-injected, sclera clear  Musculoskeletal:no cyanosis of digits and no clubbing  NEURO: alert & oriented x 3 with fluent speech, no focal motor/sensory deficits  LABORATORY DATA:  I have reviewed the data as listed    Component Value Date/Time   NA 138 06/11/2014 1233   NA 140 06/07/2012 1241   K 3.9 06/11/2014 1233   K 3.9 06/07/2012 1241   CL 103 04/17/2013 0803   CL 103 06/07/2012 1241   CO2 27 06/11/2014 1233   CO2 28 06/07/2012 1241   GLUCOSE 90 06/11/2014 1233   GLUCOSE 78 04/17/2013 0803   GLUCOSE 91 06/07/2012 1241   BUN 10.8 06/11/2014 1233   BUN 16 06/07/2012 1241   CREATININE 0.8 06/11/2014 1233   CREATININE 1.05 06/07/2012 1241   CALCIUM 9.3 06/11/2014 1233   CALCIUM 9.3 06/07/2012 1241   PROT 7.1 06/11/2014 1233   PROT 6.3 03/17/2012 0824   ALBUMIN 3.9 06/11/2014 1233   ALBUMIN 4.1 03/17/2012 0824   AST 18 06/11/2014 1233   AST 16 03/17/2012 0824   ALT 12 06/11/2014 1233   ALT <8 03/17/2012 0824   ALKPHOS 75 06/11/2014 1233   ALKPHOS 61  03/17/2012 0824   BILITOT 0.35 06/11/2014 1233   BILITOT 0.2* 03/17/2012 0824   GFRNONAA 55* 06/07/2012 1241   GFRAA 64* 06/07/2012 1241    No results found for this basename: SPEP, UPEP,  kappa and lambda light chains    Lab Results  Component Value Date   WBC 5.0 06/11/2014   NEUTROABS 2.8 06/11/2014   HGB 12.2 06/11/2014   HCT 37.9 06/11/2014   MCV 93.4 06/11/2014   PLT 272 06/11/2014      Chemistry      Component Value Date/Time   NA 138 06/11/2014 1233   NA 140 06/07/2012 1241   K 3.9 06/11/2014 1233   K 3.9 06/07/2012 1241   CL 103 04/17/2013 0803   CL 103 06/07/2012 1241   CO2 27 06/11/2014 1233   CO2 28 06/07/2012 1241   BUN 10.8 06/11/2014 1233   BUN 16 06/07/2012 1241   CREATININE 0.8 06/11/2014 1233   CREATININE 1.05 06/07/2012 1241      Component Value Date/Time   CALCIUM 9.3 06/11/2014 1233   CALCIUM  9.3 06/07/2012 1241   ALKPHOS 75 06/11/2014 1233   ALKPHOS 61 03/17/2012 0824   AST 18 06/11/2014 1233   AST 16 03/17/2012 0824   ALT 12 06/11/2014 1233   ALT <8 03/17/2012 0824   BILITOT 0.35 06/11/2014 1233   BILITOT 0.2* 03/17/2012 0824     ASSESSMENT & PLAN:  Cancer of hypopharynx We discussed the role of chemotherapy. The intent is for palliative.  We discussed some of the risks, benefits, side-effects of 5FU/carboplatin & Cetuximab. Some of the short term side-effects included, though not limited to, including weight loss, life threatening infections, risk of allergic reactions, need for transfusions of blood products, nausea, vomiting, change in bowel habits, loss of hair, admission to hospital for various reasons, and risks of death.   Long term side-effects are also discussed including risks of infertility, permanent damage to nerve function, hearing loss, chronic fatigue, kidney damage with possibility needing hemodialysis, and rare secondary malignancy including bone marrow disorders.  The patient is aware that the response rates discussed earlier is not guaranteed.  After a  long discussion, patient made an informed decision to proceed with the prescribed plan of care and went ahead to sign the consent form today.   Patient education material was dispensed.  The decision was made based on publication at the [facility]. It is a category 1 recommendation from NCCN. Vermorken, et al. Alison Stalling J Med [id number]  The chemotherapy consists of   1. Carboplatin (at an area under the curve of 5 mg per milliliter per minute, as a 1-hour intravenous infusion on day 1) plus  2. Fluorouracil (at a dose of 1000 mg per square meter per day for 4 days) every 3 weeks for a maximum of 6 cycles  3. Cetuximab (at a dose of 400 mg per square meter initially, as a 2-hour intravenous infusion, then 250 mg per square meter, as a 1-hour intravenous infusion per week) for a maximum of 6 cycles.   Patients with stable disease who received chemotherapy plus cetuximab continued to receive cetuximab until disease progression or unacceptable toxic effects, whichever occurred first.  Adding cetuximab to platinum-based chemotherapy with fluorouracil (platinum-fluorouracil) significantly prolonged the median overall survival from 7.4 months in the chemotherapy-alone group to 10.1 months in the group that received chemotherapy plus cetuximab (hazard ratio for death, 0.80; 95% confidence interval, 0.64 to 0.99; P=0.04).   The addition of cetuximab prolonged the median progression-free survival time from 3.3 to 5.6 months (hazard ratio for progression, 0.54; P<0.001) and increased the response rate from 20% to 36% (P<0.001).   There were no cetuximab-related deaths.      Multiple lung nodules CT-guided biopsy and PET/CT scan from for malignancy. Overall, she is not symptomatic now.     Orders Placed This Encounter  Procedures  . Comprehensive metabolic panel    Standing Status: Future     Number of Occurrences:      Standing Expiration Date: 06/11/2015  . CBC with Differential     Standing Status: Future     Number of Occurrences:      Standing Expiration Date: 06/11/2015   All questions were answered. The patient knows to call the clinic with any problems, questions or concerns. No barriers to learning was detected. I spent 40 minutes counseling the patient face to face. The total time spent in the appointment was 60 minutes and more than 50% was on counseling and review of test results     [facility], Shayma Pfefferle, MD 06/11/2014  1:27 PM

## 2014-06-11 NOTE — Assessment & Plan Note (Signed)
We discussed the role of chemotherapy. The intent is for palliative.  We discussed some of the risks, benefits, side-effects of 5FU/carboplatin & Cetuximab. Some of the short term side-effects included, though not limited to, including weight loss, life threatening infections, risk of allergic reactions, need for transfusions of blood products, nausea, vomiting, change in bowel habits, loss of hair, admission to hospital for various reasons, and risks of death.   Long term side-effects are also discussed including risks of infertility, permanent damage to nerve function, hearing loss, chronic fatigue, kidney damage with possibility needing hemodialysis, and rare secondary malignancy including bone marrow disorders.  The patient is aware that the response rates discussed earlier is not guaranteed.  After a long discussion, patient made an informed decision to proceed with the prescribed plan of care and went ahead to sign the consent form today.   Patient education material was dispensed.  The decision was made based on publication at the [facility]. It is a category 1 recommendation from NCCN. Vermorken, et al. Alison Stalling J Med [phone]  The chemotherapy consists of   1. Carboplatin (at an area under the curve of 5 mg per milliliter per minute, as a 1-hour intravenous infusion on day 1) plus  2. Fluorouracil (at a dose of 1000 mg per square meter per day for 4 days) every 3 weeks for a maximum of 6 cycles  3. Cetuximab (at a dose of 400 mg per square meter initially, as a 2-hour intravenous infusion, then 250 mg per square meter, as a 1-hour intravenous infusion per week) for a maximum of 6 cycles.   Patients with stable disease who received chemotherapy plus cetuximab continued to receive cetuximab until disease progression or unacceptable toxic effects, whichever occurred first.  Adding cetuximab to platinum-based chemotherapy with fluorouracil (platinum-fluorouracil) significantly prolonged  the median overall survival from 7.4 months in the chemotherapy-alone group to 10.1 months in the group that received chemotherapy plus cetuximab (hazard ratio for death, 0.80; 95% confidence interval, 0.64 to 0.99; P=0.04).   The addition of cetuximab prolonged the median progression-free survival time from 3.3 to 5.6 months (hazard ratio for progression, 0.54; P<0.001) and increased the response rate from 20% to 36% (P<0.001).   There were no cetuximab-related deaths.

## 2014-06-12 ENCOUNTER — Ambulatory Visit (HOSPITAL_COMMUNITY)
Admission: RE | Admit: 2014-06-12 | Discharge: 2014-06-12 | Disposition: A | Discharge status: Home / Self Care | Payer: Medicare Other | Source: Ambulatory Visit | Attending: Hematology and Oncology | Admitting: Hematology and Oncology

## 2014-06-12 ENCOUNTER — Other Ambulatory Visit: Payer: Self-pay

## 2014-06-12 ENCOUNTER — Encounter (HOSPITAL_COMMUNITY): Payer: Self-pay

## 2014-06-12 ENCOUNTER — Other Ambulatory Visit: Payer: Self-pay | Admitting: Hematology and Oncology

## 2014-06-12 ENCOUNTER — Ambulatory Visit: Payer: Self-pay

## 2014-06-12 ENCOUNTER — Ambulatory Visit: Payer: Self-pay | Admitting: Hematology and Oncology

## 2014-06-12 DIAGNOSIS — C139 Malignant neoplasm of hypopharynx, unspecified: Secondary | ICD-10-CM

## 2014-06-12 DIAGNOSIS — K219 Gastro-esophageal reflux disease without esophagitis: Secondary | ICD-10-CM | POA: Insufficient documentation

## 2014-06-12 DIAGNOSIS — C7800 Secondary malignant neoplasm of unspecified lung: Secondary | ICD-10-CM | POA: Diagnosis not present

## 2014-06-12 DIAGNOSIS — Z87891 Personal history of nicotine dependence: Secondary | ICD-10-CM | POA: Insufficient documentation

## 2014-06-12 DIAGNOSIS — C01 Malignant neoplasm of base of tongue: Secondary | ICD-10-CM | POA: Insufficient documentation

## 2014-06-12 DIAGNOSIS — Z79899 Other long term (current) drug therapy: Secondary | ICD-10-CM | POA: Diagnosis not present

## 2014-06-12 LAB — CBC WITH DIFFERENTIAL/PLATELET
BASOS ABS: 0 10*3/uL (ref 0.0–0.1)
Basophils Relative: 1 % (ref 0–1)
EOS PCT: 4 % (ref 0–5)
Eosinophils Absolute: 0.2 10*3/uL (ref 0.0–0.7)
HCT: 38.4 % (ref 36.0–46.0)
Hemoglobin: 12.5 g/dL (ref 12.0–15.0)
Lymphocytes Relative: 33 % (ref 12–46)
Lymphs Abs: 1.3 10*3/uL (ref 0.7–4.0)
MCH: 30 pg (ref 26.0–34.0)
MCHC: 32.6 g/dL (ref 30.0–36.0)
MCV: 92.3 fL (ref 78.0–100.0)
Monocytes Absolute: 0.3 10*3/uL (ref 0.1–1.0)
Monocytes Relative: 8 % (ref 3–12)
Neutro Abs: 2.1 10*3/uL (ref 1.7–7.7)
Neutrophils Relative %: 54 % (ref 43–77)
PLATELETS: 229 10*3/uL (ref 150–400)
RBC: 4.16 MIL/uL (ref 3.87–5.11)
RDW: 14.7 % (ref 11.5–15.5)
WBC: 3.8 10*3/uL — ABNORMAL LOW (ref 4.0–10.5)

## 2014-06-12 LAB — APTT: aPTT: 36 seconds (ref 24–37)

## 2014-06-12 LAB — PROTIME-INR
INR: 0.91 (ref 0.00–1.49)
Prothrombin Time: 12.3 seconds (ref 11.6–15.2)

## 2014-06-12 MED ORDER — CEFAZOLIN SODIUM-DEXTROSE 2-3 GM-% IV SOLR
2.0000 g | INTRAVENOUS | Status: AC
  Administered 2014-06-12: 2 g via INTRAVENOUS

## 2014-06-12 MED ORDER — FENTANYL CITRATE 0.05 MG/ML IJ SOLN
INTRAMUSCULAR | Status: AC
  Filled 2014-06-12: qty 6

## 2014-06-12 MED ORDER — LIDOCAINE HCL 1 % IJ SOLN
INTRAMUSCULAR | Status: AC
  Filled 2014-06-12: qty 20

## 2014-06-12 MED ORDER — HEPARIN SOD (PORK) LOCK FLUSH 100 UNIT/ML IV SOLN
INTRAVENOUS | Status: DC
Start: 2014-06-12 — End: 2014-06-13
  Filled 2014-06-12: qty 5

## 2014-06-12 MED ORDER — MIDAZOLAM HCL 2 MG/2ML IJ SOLN
INTRAMUSCULAR | Status: AC
  Filled 2014-06-12: qty 6

## 2014-06-12 MED ORDER — MIDAZOLAM HCL 2 MG/2ML IJ SOLN
INTRAMUSCULAR | Status: AC | PRN
  Administered 2014-06-12 (×2): 0.5 mg via INTRAVENOUS
  Administered 2014-06-12: 1 mg via INTRAVENOUS

## 2014-06-12 MED ORDER — SODIUM CHLORIDE 0.9 % IV SOLN
INTRAVENOUS | Status: DC
  Administered 2014-06-12: 08:00:00 via INTRAVENOUS

## 2014-06-12 MED ORDER — LIDOCAINE-EPINEPHRINE (PF) 2 %-1:200000 IJ SOLN
INTRAMUSCULAR | Status: AC
  Filled 2014-06-12: qty 20

## 2014-06-12 MED ORDER — HEPARIN SOD (PORK) LOCK FLUSH 100 UNIT/ML IV SOLN
INTRAVENOUS | Status: AC | PRN
  Administered 2014-06-12: 500 [IU]

## 2014-06-12 MED ORDER — FENTANYL CITRATE 0.05 MG/ML IJ SOLN
INTRAMUSCULAR | Status: AC | PRN
  Administered 2014-06-12: 25 ug via INTRAVENOUS
  Administered 2014-06-12: 50 ug via INTRAVENOUS
  Administered 2014-06-12: 25 ug via INTRAVENOUS

## 2014-06-12 MED ORDER — CEFAZOLIN SODIUM-DEXTROSE 2-3 GM-% IV SOLR
INTRAVENOUS | Status: AC
  Administered 2014-06-12: 2 g via INTRAVENOUS
  Filled 2014-06-12: qty 50

## 2014-06-12 NOTE — Discharge Instructions (Signed)
Implanted Port Home Guide °An implanted port is a type of central line that is placed under the skin. Central lines are used to provide IV access when treatment or nutrition needs to be given through a person's veins. Implanted ports are used for long-term IV access. An implanted port may be placed because:  °· You need IV medicine that would be irritating to the small veins in your hands or arms.   °· You need long-term IV medicines, such as antibiotics.   °· You need IV nutrition for a long period.   °· You need frequent blood draws for lab tests.   °· You need dialysis.   °Implanted ports are usually placed in the chest area, but they can also be placed in the upper arm, the abdomen, or the leg. An implanted port has two main parts:  °· Reservoir. The reservoir is round and will appear as a small, raised area under your skin. The reservoir is the part where a needle is inserted to give medicines or draw blood.   °· Catheter. The catheter is a thin, flexible tube that extends from the reservoir. The catheter is placed into a large vein. Medicine that is inserted into the reservoir goes into the catheter and then into the vein.   °HOW WILL I CARE FOR MY INCISION SITE? °Do not get the incision site wet. Bathe or shower as directed by your health care provider.  °HOW IS MY PORT ACCESSED? °Special steps must be taken to access the port:  °· Before the port is accessed, a numbing cream can be placed on the skin. This helps numb the skin over the port site.   °· Your health care provider uses a sterile technique to access the port. °¨ Your health care provider must put on a mask and sterile gloves. °¨ The skin over your port is cleaned carefully with an antiseptic and allowed to dry. °¨ The port is gently pinched between sterile gloves, and a needle is inserted into the port. °· Only "non-coring" port needles should be used to access the port. Once the port is accessed, a blood return should be checked. This helps  ensure that the port is in the vein and is not clogged.   °· If your port needs to remain accessed for a constant infusion, a clear (transparent) bandage will be placed over the needle site. The bandage and needle will need to be changed every week, or as directed by your health care provider.   °· Keep the bandage covering the needle clean and dry. Do not get it wet. Follow your health care provider's instructions on how to take a shower or bath while the port is accessed.   °· If your port does not need to stay accessed, no bandage is needed over the port.   °WHAT IS FLUSHING? °Flushing helps keep the port from getting clogged. Follow your health care provider's instructions on how and when to flush the port. Ports are usually flushed with saline solution or a medicine called heparin. The need for flushing will depend on how the port is used.  °· If the port is used for intermittent medicines or blood draws, the port will need to be flushed:   °¨ After medicines have been given.   °¨ After blood has been drawn.   °¨ As part of routine maintenance.   °· If a constant infusion is running, the port may not need to be flushed.   °HOW LONG WILL MY PORT STAY IMPLANTED? °The port can stay in for as long as your health care   provider thinks it is needed. When it is time for the port to come out, surgery will be done to remove it. The procedure is similar to the one performed when the port was put in.  WHEN SHOULD I SEEK IMMEDIATE MEDICAL CARE? When you have an implanted port, you should seek immediate medical care if:   You notice a bad smell coming from the incision site.   You have swelling, redness, or drainage at the incision site.   You have more swelling or pain at the port site or the surrounding area.   You have a fever that is not controlled with medicine. Document Released: 11/01/2005 Document Revised: 08/22/2013 Document Reviewed: 07/09/2013 [facility] Patient Information 2015 [place], [place]. This  information is not intended to replace advice given to you by your health care provider. Make sure you discuss any questions you have with your health care provider. Conscious Sedation Sedation is the use of medicines to promote relaxation and relieve discomfort and anxiety. Conscious sedation is a type of sedation. Under conscious sedation you are less alert than normal but are still able to respond to instructions or stimulation. Conscious sedation is used during short medical and dental procedures. It is milder than deep sedation or general anesthesia and allows you to return to your regular activities sooner.  LET [facility] CARE PROVIDER KNOW ABOUT:   Any allergies you have.  All medicines you are taking, including vitamins, herbs, eye drops, creams, and over-the-counter medicines.  Use of steroids (by mouth or creams).  Previous problems you or members of your family have had with the use of anesthetics.  Any blood disorders you have.  Previous surgeries you have had.  Medical conditions you have.  Possibility of pregnancy, if this applies.  Use of cigarettes, alcohol, or illegal drugs. RISKS AND COMPLICATIONS Generally, this is a safe procedure. However, as with any procedure, problems can occur. Possible problems include:  Oversedation.  Trouble breathing on your own. You may need to have a breathing tube until you are awake and breathing on your own.  Allergic reaction to any of the medicines used for the procedure. BEFORE THE PROCEDURE  You may have blood tests done. These tests can help show how well your kidneys and liver are working. They can also show how well your blood clots.  A physical exam will be done.  Only take medicines as directed by your health care provider. You may need to stop taking medicines (such as blood thinners, aspirin, or nonsteroidal anti-inflammatory drugs) before the procedure.   Do not eat or drink at least 6 hours before the procedure  or as directed by your health care provider.  Arrange for a responsible adult, family member, or friend to take you home after the procedure. He or she should stay with you for at least 24 hours after the procedure, until the medicine has worn off. PROCEDURE   An intravenous (IV) catheter will be inserted into one of your veins. Medicine will be able to flow directly into your body through this catheter. You may be given medicine through this tube to help prevent pain and help you relax.  The medical or dental procedure will be done. AFTER THE PROCEDURE  You will stay in a recovery area until the medicine has worn off. Your blood pressure and pulse will be checked.   Depending on the procedure you had, you may be allowed to go home when you can tolerate liquids and your pain is under  control. Document Released: 07/27/2001 Document Revised: 11/06/2013 Document Reviewed: 07/09/2013 [facility] Patient Information 2015 [place], [place]. This information is not intended to replace advice given to you by your health care provider. Make sure you discuss any questions you have with your health care provider. Implanted Port Insertion, Care After Refer to this sheet in the next few weeks. These instructions provide you with information on caring for yourself after your procedure. Your health care provider may also give you more specific instructions. Your treatment has been planned according to current medical practices, but problems sometimes occur. Call your health care provider if you have any problems or questions after your procedure. WHAT TO EXPECT AFTER THE PROCEDURE After your procedure, it is typical to have the following:   Discomfort at the port insertion site. Ice packs to the area will help.  Bruising on the skin over the port. This will subside in 3-4 days. HOME CARE INSTRUCTIONS  After your port is placed, you will get a manufacturer's information card. The card has information about  your port. Keep this card with you at all times.   Know what kind of port you have. There are many types of ports available.   Wear a medical alert bracelet in case of an emergency. This can help alert health care workers that you have a port.   The port can stay in for as long as your health care provider believes it is necessary.   A home health care nurse may give medicines and take care of the port.   You or a family member can get special training and directions for giving medicine and taking care of the port at home.  SEEK MEDICAL CARE IF:   Your port does not flush or you are unable to get a blood return.   You have a fever or chills. SEEK IMMEDIATE MEDICAL CARE IF:  You have new fluid or pus coming from your incision.   You notice a bad smell coming from your incision site.   You have swelling, pain, or more redness at the incision or port site.   You have chest pain or shortness of breath. Document Released: 08/22/2013 Document Revised: 11/06/2013 Document Reviewed: 08/22/2013 [facility] Patient Information 2015 [place], [place]. This information is not intended to replace advice given to you by your health care provider. Make sure you discuss any questions you have with your health care provider.

## 2014-06-12 NOTE — Procedures (Signed)
Successful LT IJ POWER PORT TIP SVC/RA NO COMP STABLE FULL REPORT IN PACS

## 2014-06-12 NOTE — H&P (Signed)
Chief Complaint: "I am here for a port." Referring Physician: Dr. Alvy Bimler HPI: Sherry Craig is an 67 y.o. female with cancer of the hypopharynx scheduled today for a port a catheter placement for chemotherapy. She denies any chest pain, shortness of breath or palpitations. She denies any active signs of bleeding or excessive bruising. She denies any recent fever or chills. The patient denies any history of sleep apnea or chronic oxygen use. She has previously tolerated sedation without complications.    Past Medical History:  Past Medical History  Diagnosis Date  . Hyperlipemia   . Allergy   . Recurrent sinus infections   . 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 Tongue cancer   . GERD (gastroesophageal reflux disease)   . Osteopenia   . 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 Sinus infection     History of  . S/P radiation therapy 10/28/2011 - 12/17/2011    70 Gy to right Hypopharynx, involved lymph nodes, uninvolved lymph nodes  . Status post chemotherapy Started 10/28/2011    [id number] Cisplatin concurrent with Radiation Therapy  . H/O exercise stress test     done  >15 yrs. ago due to abnormal EKG.  Stress test was wnl, no need for f/u   . Arthritis     hands & feet     Past Surgical History:  Past Surgical History  Procedure Laterality Date  . Tubal ligation    . Fine needle aspiration  09/03/11    FNA to right Neck Mass - Squamous Cell Carcinoma of the Right Hypopharynx  . Refractive surgery      for retinal tears- seen by Dr. Baird Cancer   . Diagnostic laryngoscopy      done by Dr. Wilburn Cornelia- 07/2011- to  be done for diagnosis of CA of hypopharynyx   . Radical neck dissection  06/13/2012    Procedure: RADICAL NECK DISSECTION;  Surgeon: Jerrell Belfast, MD;  Location: [facility] OR;  Service: ENT;  Laterality: Right;  Right Neck Dissection     Family History:  Family History  Problem Relation Age of Onset  . Coronary artery disease Mother   .  Heart disease Mother   . Hypertension Mother   . Heart attack Mother   . Pulmonary embolism Sister   . Cancer Maternal Aunt     bladder ca    Social History:  reports that she quit smoking about 4 years ago. Her smoking use included Cigarettes. She has a 30 pack-year smoking history. She has never used smokeless tobacco. She reports that she drinks about 1.2 ounces of alcohol per week. She reports that she does not use illicit drugs.  Allergies: No Known Allergies  Medications:   Medication List    ASK your doctor about these medications       calcium carbonate 1250 MG chewable tablet  Commonly known as:  OS-CAL  Chew 1 tablet by mouth daily.     fexofenadine-pseudoephedrine 180-240 MG per 24 hr tablet  Commonly known as:  ALLEGRA-D 24  Take 1 tablet by mouth daily as needed (for allergies).     lidocaine-prilocaine cream  Commonly known as:  EMLA  Apply 1 application topically as needed.     omeprazole 40 MG capsule  Commonly known as:  PRILOSEC  Take 40 mg by mouth daily as needed (Acid reflux).     ondansetron 8 MG tablet  Commonly known as:  ZOFRAN  Take 1 tablet (8 mg  total) by mouth every 8 (eight) hours as needed.     OVER THE COUNTER MEDICATION  Take 1 tablet by mouth daily. MED NAME: Vitamin B-17     pravastatin 40 MG tablet  Commonly known as:  PRAVACHOL  Take 40 mg by mouth daily.     prochlorperazine 10 MG tablet  Commonly known as:  COMPAZINE  Take 1 tablet (10 mg total) by mouth every 6 (six) hours as needed (Nausea or vomiting).     SYSTANE OP  Place 1 drop into both eyes 3 (three) times daily as needed (for allergies).     Vitamin D 2000 UNITS Caps  Take 2,000 Units by mouth daily.       Please HPI for pertinent positives, otherwise complete 10 system ROS negative.  Physical Exam: BP 128/75  Pulse 78  Temp(Src) 98.1 F (36.7 C) (Oral)  Resp 16  SpO2 99% There is no weight on file to calculate BMI.  General Appearance:  Alert,  cooperative, no distress  Head:  Normocephalic, without obvious abnormality, atraumatic  Neck: Supple, symmetrical, trachea midline  Lungs:   Clear to auscultation bilaterally, no w/r/r, respirations unlabored without use of accessory muscles.  Chest Wall:  No tenderness or deformity  Heart:  Regular rate and rhythm, S1, S2 normal, no murmur, rub or gallop.  Abdomen:   Soft, non-tender, non distended.  Extremities: Extremities normal, atraumatic, no cyanosis or edema  Neurologic: Normal affect, no gross deficits.   Results for orders placed during the hospital encounter of 06/12/14 (from the past 48 hour(s))  APTT     Status: None   Collection Time    06/12/14  7:50 AM      Result Value Ref Range   aPTT 36  24 - 37 seconds  CBC WITH DIFFERENTIAL     Status: Abnormal   Collection Time    06/12/14  7:50 AM      Result Value Ref Range   WBC 3.8 (*) 4.0 - 10.5 K/uL   RBC 4.16  3.87 - 5.11 MIL/uL   Hemoglobin 12.5  12.0 - 15.0 g/dL   HCT 38.4  36.0 - 46.0 %   MCV 92.3  78.0 - 100.0 fL   MCH 30.0  26.0 - 34.0 pg   MCHC 32.6  30.0 - 36.0 g/dL   RDW 14.7  11.5 - 15.5 %   Platelets 229  150 - 400 K/uL   Neutrophils Relative % 54  43 - 77 %   Neutro Abs 2.1  1.7 - 7.7 K/uL   Lymphocytes Relative 33  12 - 46 %   Lymphs Abs 1.3  0.7 - 4.0 K/uL   Monocytes Relative 8  3 - 12 %   Monocytes Absolute 0.3  0.1 - 1.0 K/uL   Eosinophils Relative 4  0 - 5 %   Eosinophils Absolute 0.2  0.0 - 0.7 K/uL   Basophils Relative 1  0 - 1 %   Basophils Absolute 0.0  0.0 - 0.1 K/uL  PROTIME-INR     Status: None   Collection Time    06/12/14  7:50 AM      Result Value Ref Range   Prothrombin Time 12.3  11.6 - 15.2 seconds   INR 0.91  0.00 - 1.49   No results found.  Assessment/Plan Cancer of the hypopharynx Scheduled today for image guided port a catheter placement.  Patient has been NPO, no blood thinners taken, afebrile, labs reviewed. Risks and Benefits discussed  with the patient. All of the  patient's questions were answered, patient is agreeable to proceed. Consent signed and in chart.   Tsosie Billing D PA-C 06/12/2014, 8:32 AM

## 2014-06-13 ENCOUNTER — Other Ambulatory Visit: Payer: Self-pay | Admitting: Hematology and Oncology

## 2014-06-13 ENCOUNTER — Ambulatory Visit (HOSPITAL_BASED_OUTPATIENT_CLINIC_OR_DEPARTMENT_OTHER): Payer: Medicare Other

## 2014-06-13 VITALS — BP 162/77 | HR 73 | Temp 97.1°F | Resp 18

## 2014-06-13 DIAGNOSIS — Z5111 Encounter for antineoplastic chemotherapy: Secondary | ICD-10-CM

## 2014-06-13 DIAGNOSIS — C139 Malignant neoplasm of hypopharynx, unspecified: Secondary | ICD-10-CM

## 2014-06-13 DIAGNOSIS — Z5112 Encounter for antineoplastic immunotherapy: Secondary | ICD-10-CM

## 2014-06-13 MED ORDER — DIPHENHYDRAMINE HCL 50 MG/ML IJ SOLN
50.0000 mg | Freq: Once | INTRAMUSCULAR | Status: AC
  Administered 2014-06-13: 50 mg via INTRAVENOUS

## 2014-06-13 MED ORDER — ONDANSETRON 16 MG/50ML IVPB (CHCC)
INTRAVENOUS | Status: AC
  Filled 2014-06-13: qty 16

## 2014-06-13 MED ORDER — CETUXIMAB CHEMO IV INJECTION 200 MG/100ML
400.0000 mg/m2 | Freq: Once | INTRAVENOUS | Status: AC
  Administered 2014-06-13: 600 mg via INTRAVENOUS
  Filled 2014-06-13: qty 300

## 2014-06-13 MED ORDER — SODIUM CHLORIDE 0.9 % IV SOLN
388.5000 mg | Freq: Once | INTRAVENOUS | Status: AC
  Administered 2014-06-13: 390 mg via INTRAVENOUS
  Filled 2014-06-13: qty 39

## 2014-06-13 MED ORDER — DIPHENHYDRAMINE HCL 50 MG/ML IJ SOLN
INTRAMUSCULAR | Status: AC
  Filled 2014-06-13: qty 1

## 2014-06-13 MED ORDER — ONDANSETRON 16 MG/50ML IVPB (CHCC)
16.0000 mg | Freq: Once | INTRAVENOUS | Status: AC
  Administered 2014-06-13: 16 mg via INTRAVENOUS

## 2014-06-13 MED ORDER — DEXAMETHASONE SODIUM PHOSPHATE 20 MG/5ML IJ SOLN
INTRAMUSCULAR | Status: AC
  Filled 2014-06-13: qty 5

## 2014-06-13 MED ORDER — SODIUM CHLORIDE 0.9 % IV SOLN
1000.0000 mg/m2/d | INTRAVENOUS | Status: DC
  Administered 2014-06-13: 6400 mg via INTRAVENOUS
  Filled 2014-06-13: qty 128

## 2014-06-13 MED ORDER — DEXAMETHASONE SODIUM PHOSPHATE 20 MG/5ML IJ SOLN
20.0000 mg | Freq: Once | INTRAMUSCULAR | Status: AC
  Administered 2014-06-13: 20 mg via INTRAVENOUS

## 2014-06-13 MED ORDER — SODIUM CHLORIDE 0.9 % IV SOLN
Freq: Once | INTRAVENOUS | Status: AC
  Administered 2014-06-13: 09:00:00 via INTRAVENOUS

## 2014-06-13 NOTE — Patient Instructions (Signed)
[facility] Discharge Instructions for Patients Receiving Chemotherapy  Today you received the following chemotherapy agents Erbitux/Carboplatin/5 FU To help prevent nausea and vomiting after your treatment, we encourage you to take your nausea medication as prescribed.  If you develop nausea and vomiting that is not controlled by your nausea medication, call the clinic.   BELOW ARE SYMPTOMS THAT SHOULD BE REPORTED IMMEDIATELY:  *FEVER GREATER THAN 100.5 F  *CHILLS WITH OR WITHOUT FEVER  NAUSEA AND VOMITING THAT IS NOT CONTROLLED WITH YOUR NAUSEA MEDICATION  *UNUSUAL SHORTNESS OF BREATH  *UNUSUAL BRUISING OR BLEEDING  TENDERNESS IN MOUTH AND THROAT WITH OR WITHOUT PRESENCE OF ULCERS  *URINARY PROBLEMS  *BOWEL PROBLEMS  UNUSUAL RASH Items with * indicate a potential emergency and should be followed up as soon as possible.  Feel free to call the clinic you have any questions or concerns. The clinic phone number is [phone].     Cetuximab injection (Erbitux) What is this medicine? CETUXIMAB (se TUX i mab) is a chemotherapy drug. It targets a specific protein within cancer cells and stops the cells from growing. It is used to treat colorectal cancer and head and neck cancer. This medicine may be used for other purposes; ask your health care provider or pharmacist if you have questions. COMMON BRAND NAME(S): Erbitux What should I tell my health care provider before I take this medicine? They need to know if you have any of these conditions: -heart disease -history of irregular heartbeat -history of low levels of calcium, magnesium, or potassium in the blood -lung or breathing disease, like asthma -an unusual or allergic reaction to cetuximab, other medicines, foods, dyes, or preservatives -pregnant or trying to get pregnant -breast-feeding How should I use this medicine? This drug is given as an infusion into a vein. It is administered in a hospital or  clinic by a specially trained health care professional. Talk to your pediatrician regarding the use of this medicine in children. Special care may be needed. Overdosage: If you think you have taken too much of this medicine contact a poison control center or emergency room at once. NOTE: This medicine is only for you. Do not share this medicine with others. What if I miss a dose? It is important not to miss your dose. Call your doctor or health care professional if you are unable to keep an appointment. What may interact with this medicine? Interactions are not expected. This list may not describe all possible interactions. Give your health care provider a list of all the medicines, herbs, non-prescription drugs, or dietary supplements you use. Also tell them if you smoke, drink alcohol, or use illegal drugs. Some items may interact with your medicine. What should I watch for while using this medicine? Visit your doctor or health care professional for regular checks on your progress. This drug may make you feel generally unwell. This is not uncommon, as chemotherapy can affect healthy cells as well as cancer cells. Report any side effects. Continue your course of treatment even though you feel ill unless your doctor tells you to stop. This medicine can make you more sensitive to the sun. Keep out of the sun while taking this medicine and for 2 months after the last dose. If you cannot avoid being in the sun, wear protective clothing and use sunscreen. Do not use sun lamps or tanning beds/booths. You may need blood work done while you are taking this medicine. In some cases, you may be given additional  medicines to help with side effects. Follow all directions for their use. Call your doctor or health care professional for advice if you get a fever, chills or sore throat, or other symptoms of a cold or flu. Do not treat yourself. This drug decreases your body's ability to fight infections. Try to avoid  being around people who are sick. Avoid taking products that contain aspirin, acetaminophen, ibuprofen, naproxen, or ketoprofen unless instructed by your doctor. These medicines may hide a fever. Do not become pregnant while taking this medicine. Women should inform their doctor if they wish to become pregnant or think they might be pregnant. There is a potential for serious side effects to an unborn child. Use adequate birth control methods. Avoid pregnancy for at least 6 months after your last dose. Talk to your health care professional or pharmacist for more information. Do not breast-feed an infant while taking this medicine or during the 2 months after your last dose. What side effects may I notice from receiving this medicine? Side effects that you should report to your doctor or health care professional as soon as possible: -allergic reactions like skin rash, itching or hives, swelling of the face, lips, or tongue -breathing problems -changes in vision -fast, irregular heartbeat -feeling faint or lightheaded, falls -fever, chills -mouth sores -redness, blistering, peeling or loosening of the skin, including inside the mouth -trouble passing urine or change in the amount of urine -unusually weak or tired Side effects that usually do not require medical attention (report to your doctor or health care professional if they continue or are bothersome): -changes in skin like acne, cracks, skin dryness -constipation -diarrhea -headache -nail changes -nausea, vomiting -stomach upset -weight loss This list may not describe all possible side effects. Call your doctor for medical advice about side effects. You may report side effects to FDA at 1-800-FDA-1088. Where should I keep my medicine? This drug is given in a hospital or clinic and will not be stored at home. NOTE: This sheet is a summary. It may not cover all possible information. If you have questions about this medicine, talk to your  doctor, pharmacist, or health care provider.  2015, Else[employer] Standard. (2014-02-13 16:14:34)  Carboplatin injection What is this medicine? CARBOPLATIN (KAR boe pla tin) is a chemotherapy drug. It targets fast dividing cells, like cancer cells, and causes these cells to die. This medicine is used to treat ovarian cancer and many other cancers. This medicine may be used for other purposes; ask your health care provider or pharmacist if you have questions. COMMON BRAND NAME(S): Paraplatin What should I tell my health care provider before I take this medicine? They need to know if you have any of these conditions: -blood disorders -hearing problems -kidney disease -recent or ongoing radiation therapy -an unusual or allergic reaction to carboplatin, cisplatin, other chemotherapy, other medicines, foods, dyes, or preservatives -pregnant or trying to get pregnant -breast-feeding How should I use this medicine? This drug is usually given as an infusion into a vein. It is administered in a hospital or clinic by a specially trained health care professional. Talk to your pediatrician regarding the use of this medicine in children. Special care may be needed. Overdosage: If you think you have taken too much of this medicine contact a poison control center or emergency room at once. NOTE: This medicine is only for you. Do not share this medicine with others. What if I miss a dose? It is important not to miss a dose. Call  your doctor or health care professional if you are unable to keep an appointment. What may interact with this medicine? -medicines for seizures -medicines to increase blood counts like filgrastim, pegfilgrastim, sargramostim -some antibiotics like amikacin, gentamicin, neomycin, streptomycin, tobramycin -vaccines Talk to your doctor or health care professional before taking any of these medicines: -acetaminophen -aspirin -ibuprofen -ketoprofen -naproxen This list may not  describe all possible interactions. Give your health care provider a list of all the medicines, herbs, non-prescription drugs, or dietary supplements you use. Also tell them if you smoke, drink alcohol, or use illegal drugs. Some items may interact with your medicine. What should I watch for while using this medicine? Your condition will be monitored carefully while you are receiving this medicine. You will need important blood work done while you are taking this medicine. This drug may make you feel generally unwell. This is not uncommon, as chemotherapy can affect healthy cells as well as cancer cells. Report any side effects. Continue your course of treatment even though you feel ill unless your doctor tells you to stop. In some cases, you may be given additional medicines to help with side effects. Follow all directions for their use. Call your doctor or health care professional for advice if you get a fever, chills or sore throat, or other symptoms of a cold or flu. Do not treat yourself. This drug decreases your body's ability to fight infections. Try to avoid being around people who are sick. This medicine may increase your risk to bruise or bleed. Call your doctor or health care professional if you notice any unusual bleeding. Be careful brushing and flossing your teeth or using a toothpick because you may get an infection or bleed more easily. If you have any dental work done, tell your dentist you are receiving this medicine. Avoid taking products that contain aspirin, acetaminophen, ibuprofen, naproxen, or ketoprofen unless instructed by your doctor. These medicines may hide a fever. Do not become pregnant while taking this medicine. Women should inform their doctor if they wish to become pregnant or think they might be pregnant. There is a potential for serious side effects to an unborn child. Talk to your health care professional or pharmacist for more information. Do not breast-feed an infant  while taking this medicine. What side effects may I notice from receiving this medicine? Side effects that you should report to your doctor or health care professional as soon as possible: -allergic reactions like skin rash, itching or hives, swelling of the face, lips, or tongue -signs of infection - fever or chills, cough, sore throat, pain or difficulty passing urine -signs of decreased platelets or bleeding - bruising, pinpoint red spots on the skin, black, tarry stools, nosebleeds -signs of decreased red blood cells - unusually weak or tired, fainting spells, lightheadedness -breathing problems -changes in hearing -changes in vision -chest pain -high blood pressure -low blood counts - This drug may decrease the number of white blood cells, red blood cells and platelets. You may be at increased risk for infections and bleeding. -nausea and vomiting -pain, swelling, redness or irritation at the injection site -pain, tingling, numbness in the hands or feet -problems with balance, talking, walking -trouble passing urine or change in the amount of urine Side effects that usually do not require medical attention (report to your doctor or health care professional if they continue or are bothersome): -hair loss -loss of appetite -metallic taste in the mouth or changes in taste This list may not describe  all possible side effects. Call your doctor for medical advice about side effects. You may report side effects to FDA at 1-800-FDA-1088. Where should I keep my medicine? This drug is given in a hospital or clinic and will not be stored at home. NOTE: This sheet is a summary. It may not cover all possible information. If you have questions about this medicine, talk to your doctor, pharmacist, or health care provider.  2015, [employer] Standard. (2008-02-06 14:38:05)  Fluorouracil, 5-FU injection What is this medicine? FLUOROURACIL, 5-FU (flure oh YOOR a sil) is a chemotherapy drug. It  slows the growth of cancer cells. This medicine is used to treat many types of cancer like breast cancer, colon or rectal cancer, pancreatic cancer, and stomach cancer. This medicine may be used for other purposes; ask your health care provider or pharmacist if you have questions. COMMON BRAND NAME(S): Adrucil What should I tell my health care provider before I take this medicine? They need to know if you have any of these conditions: -blood disorders -dihydropyrimidine dehydrogenase (DPD) deficiency -infection (especially a virus infection such as chickenpox, cold sores, or herpes) -kidney disease -liver disease -malnourished, poor nutrition -recent or ongoing radiation therapy -an unusual or allergic reaction to fluorouracil, other chemotherapy, other medicines, foods, dyes, or preservatives -pregnant or trying to get pregnant -breast-feeding How should I use this medicine? This drug is given as an infusion or injection into a vein. It is administered in a hospital or clinic by a specially trained health care professional. Talk to your pediatrician regarding the use of this medicine in children. Special care may be needed. Overdosage: If you think you have taken too much of this medicine contact a poison control center or emergency room at once. NOTE: This medicine is only for you. Do not share this medicine with others. What if I miss a dose? It is important not to miss your dose. Call your doctor or health care professional if you are unable to keep an appointment. What may interact with this medicine? -allopurinol -cimetidine -dapsone -digoxin -hydroxyurea -leucovorin -levamisole -medicines for seizures like ethotoin, fosphenytoin, phenytoin -medicines to increase blood counts like filgrastim, pegfilgrastim, sargramostim -medicines that treat or prevent blood clots like warfarin, enoxaparin, and dalteparin -methotrexate -metronidazole -pyrimethamine -some other chemotherapy  drugs like busulfan, cisplatin, estramustine, vinblastine -trimethoprim -trimetrexate -vaccines Talk to your doctor or health care professional before taking any of these medicines: -acetaminophen -aspirin -ibuprofen -ketoprofen -naproxen This list may not describe all possible interactions. Give your health care provider a list of all the medicines, herbs, non-prescription drugs, or dietary supplements you use. Also tell them if you smoke, drink alcohol, or use illegal drugs. Some items may interact with your medicine. What should I watch for while using this medicine? Visit your doctor for checks on your progress. This drug may make you feel generally unwell. This is not uncommon, as chemotherapy can affect healthy cells as well as cancer cells. Report any side effects. Continue your course of treatment even though you feel ill unless your doctor tells you to stop. In some cases, you may be given additional medicines to help with side effects. Follow all directions for their use. Call your doctor or health care professional for advice if you get a fever, chills or sore throat, or other symptoms of a cold or flu. Do not treat yourself. This drug decreases your body's ability to fight infections. Try to avoid being around people who are sick. This medicine may increase your risk to  bruise or bleed. Call your doctor or health care professional if you notice any unusual bleeding. Be careful brushing and flossing your teeth or using a toothpick because you may get an infection or bleed more easily. If you have any dental work done, tell your dentist you are receiving this medicine. Avoid taking products that contain aspirin, acetaminophen, ibuprofen, naproxen, or ketoprofen unless instructed by your doctor. These medicines may hide a fever. Do not become pregnant while taking this medicine. Women should inform their doctor if they wish to become pregnant or think they might be pregnant. There is a  potential for serious side effects to an unborn child. Talk to your health care professional or pharmacist for more information. Do not breast-feed an infant while taking this medicine. Men should inform their doctor if they wish to father a child. This medicine may lower sperm counts. Do not treat diarrhea with over the counter products. Contact your doctor if you have diarrhea that lasts more than 2 days or if it is severe and watery. This medicine can make you more sensitive to the sun. Keep out of the sun. If you cannot avoid being in the sun, wear protective clothing and use sunscreen. Do not use sun lamps or tanning beds/booths. What side effects may I notice from receiving this medicine? Side effects that you should report to your doctor or health care professional as soon as possible: -allergic reactions like skin rash, itching or hives, swelling of the face, lips, or tongue -low blood counts - this medicine may decrease the number of white blood cells, red blood cells and platelets. You may be at increased risk for infections and bleeding. -signs of infection - fever or chills, cough, sore throat, pain or difficulty passing urine -signs of decreased platelets or bleeding - bruising, pinpoint red spots on the skin, black, tarry stools, blood in the urine -signs of decreased red blood cells - unusually weak or tired, fainting spells, lightheadedness -breathing problems -changes in vision -chest pain -mouth sores -nausea and vomiting -pain, swelling, redness at site where injected -pain, tingling, numbness in the hands or feet -redness, swelling, or sores on hands or feet -stomach pain -unusual bleeding Side effects that usually do not require medical attention (report to your doctor or health care professional if they continue or are bothersome): -changes in finger or toe nails -diarrhea -dry or itchy skin -hair loss -headache -loss of appetite -sensitivity of eyes to the  light -stomach upset -unusually teary eyes This list may not describe all possible side effects. Call your doctor for medical advice about side effects. You may report side effects to FDA at 1-800-FDA-1088. Where should I keep my medicine? This drug is given in a hospital or clinic and will not be stored at home. NOTE: This sheet is a summary. It may not cover all possible information. If you have questions about this medicine, talk to your doctor, pharmacist, or health care provider.  2015, [employer] Standard. (2008-03-06 13:53:16)

## 2014-06-17 ENCOUNTER — Ambulatory Visit (HOSPITAL_BASED_OUTPATIENT_CLINIC_OR_DEPARTMENT_OTHER): Payer: Medicare Other

## 2014-06-17 ENCOUNTER — Telehealth: Payer: Self-pay | Admitting: *Deleted

## 2014-06-17 VITALS — BP 108/63 | HR 94 | Temp 98.1°F

## 2014-06-17 DIAGNOSIS — C139 Malignant neoplasm of hypopharynx, unspecified: Secondary | ICD-10-CM

## 2014-06-17 MED ORDER — SODIUM CHLORIDE 0.9 % IJ SOLN
10.0000 mL | INTRAMUSCULAR | Status: DC | PRN
  Administered 2014-06-17: 10 mL
  Filled 2014-06-17: qty 10

## 2014-06-17 MED ORDER — HEPARIN SOD (PORK) LOCK FLUSH 100 UNIT/ML IV SOLN
500.0000 [IU] | Freq: Once | INTRAVENOUS | Status: AC | PRN
  Administered 2014-06-17: 500 [IU]
  Filled 2014-06-17: qty 5

## 2014-06-17 NOTE — Telephone Encounter (Signed)
Patient's husband called to report that his wife was experiencing indigestion not relieved after 30 minutes by Gas [employer] and did I have any suggestions.  In response to my inquiry, he reported that she was having some chest pain and diaphoresis.  I suggested that it wd be appropriate to visit the ER as these symptoms reflect a potential cardiac issue, he responded that they wd try some ginger ale and "think about" an ER visit if no resolve.  I consulted with RN Juliann Pulse Busick in Infusion, she confirmed that pt had her 58fu pump disengaged, confirmed that symptoms I described could be a cardiac response to chemo.  She suggested patient speak with the [place] on-call.   I called patient and encouraged her to call on-call MedOnc.  She verbalized that she is sure her indigestion is r/t to the food she ate after chemo this morning.  I encouraged her to call the [facility] on-call.  She stated she wd if she didn't feel better in a couple of minutes; I encouraged her to call after our conversation.  Gayleen Orem, RN, BSN, [facility] On[facility] Navigator [phone]

## 2014-06-18 ENCOUNTER — Telehealth: Payer: Self-pay | Admitting: *Deleted

## 2014-06-18 NOTE — Telephone Encounter (Signed)
Spoke with pt today for post chemo follow up.  Pt stated she was doing better.  Has slight raspy voice, stated has some indigestion which pt contributed from eating greasy sandwich.  Pt took Gas Ex with relief.  Stated good appetite and drinking lots of fluids as tolerated.  Bowel and bladder function fine.   Nurse noted pt had called Liliane Channel, nurse navigator with chest pain issue on 06/17/14.  Pt stated she did not have chest pain today. Instructed pt that if she experiences another episode of chest pain, pt needs to go to ER immediately for further evaluation.  Pt voiced understanding.

## 2014-06-19 ENCOUNTER — Telehealth: Payer: Self-pay | Admitting: *Deleted

## 2014-06-19 ENCOUNTER — Ambulatory Visit: Payer: Self-pay

## 2014-06-19 MED ORDER — MAGIC MOUTHWASH: 5.0000 mL | Freq: Four times a day (QID) | ORAL | Status: DC | PRN

## 2014-06-19 NOTE — Telephone Encounter (Signed)
Pt reports sore throat since yesterday afternoon.  Denies any fevers.  She has lab and Erbitux appt tomorrow morning.  Notified Dr. Alvy Bimler and she ordered Magic Mouthwash and wants to see pt in chemo tomorrow.  Instructed pt on using MMW QID PRN for sore throat. Asked pt to have chemo RN let Dr. Alvy Bimler know when she is here tomorrow, so she can come over and see her quickly.  Pt verbalized understanding.

## 2014-06-20 ENCOUNTER — Other Ambulatory Visit (HOSPITAL_BASED_OUTPATIENT_CLINIC_OR_DEPARTMENT_OTHER): Payer: Medicare Other

## 2014-06-20 ENCOUNTER — Encounter: Payer: Self-pay | Admitting: Hematology and Oncology

## 2014-06-20 ENCOUNTER — Other Ambulatory Visit: Payer: Self-pay | Admitting: Hematology and Oncology

## 2014-06-20 ENCOUNTER — Ambulatory Visit (HOSPITAL_BASED_OUTPATIENT_CLINIC_OR_DEPARTMENT_OTHER): Payer: Medicare Other

## 2014-06-20 ENCOUNTER — Encounter: Payer: Self-pay | Admitting: *Deleted

## 2014-06-20 VITALS — BP 135/75 | HR 85 | Temp 97.2°F

## 2014-06-20 DIAGNOSIS — Z5112 Encounter for antineoplastic immunotherapy: Secondary | ICD-10-CM

## 2014-06-20 DIAGNOSIS — C139 Malignant neoplasm of hypopharynx, unspecified: Secondary | ICD-10-CM

## 2014-06-20 DIAGNOSIS — K1231 Oral mucositis (ulcerative) due to antineoplastic therapy: Secondary | ICD-10-CM

## 2014-06-20 HISTORY — DX: Oral mucositis (ulcerative) due to antineoplastic therapy: K12.31

## 2014-06-20 LAB — MAGNESIUM (CC13): Magnesium: 2.3 mg/dl (ref 1.5–2.5)

## 2014-06-20 MED ORDER — DIPHENHYDRAMINE HCL 50 MG/ML IJ SOLN
INTRAMUSCULAR | Status: AC
  Filled 2014-06-20: qty 1

## 2014-06-20 MED ORDER — CETUXIMAB CHEMO IV INJECTION 200 MG/100ML
250.0000 mg/m2 | Freq: Once | INTRAVENOUS | Status: AC
  Administered 2014-06-20: 400 mg via INTRAVENOUS
  Filled 2014-06-20: qty 200

## 2014-06-20 MED ORDER — OXYCODONE HCL 5 MG PO TABS: 5.0000 mg | ORAL_TABLET | ORAL | Status: DC | PRN

## 2014-06-20 MED ORDER — SODIUM CHLORIDE 0.9 % IV SOLN
Freq: Once | INTRAVENOUS | Status: AC
  Administered 2014-06-20: 12:00:00 via INTRAVENOUS

## 2014-06-20 MED ORDER — DIPHENHYDRAMINE HCL 50 MG/ML IJ SOLN
50.0000 mg | Freq: Once | INTRAMUSCULAR | Status: AC
  Administered 2014-06-20: 50 mg via INTRAVENOUS

## 2014-06-20 MED ORDER — HEPARIN SOD (PORK) LOCK FLUSH 100 UNIT/ML IV SOLN
500.0000 [IU] | Freq: Once | INTRAVENOUS | Status: AC | PRN
  Administered 2014-06-20: 500 [IU]
  Filled 2014-06-20: qty 5

## 2014-06-20 MED ORDER — SODIUM CHLORIDE 0.9 % IJ SOLN
10.0000 mL | INTRAMUSCULAR | Status: DC | PRN
  Administered 2014-06-20: 10 mL
  Filled 2014-06-20: qty 10

## 2014-06-20 NOTE — Patient Instructions (Signed)
[facility] Discharge Instructions for Patients Receiving Chemotherapy  Today you received the following chemotherapy agents Erbitux.  To help prevent nausea and vomiting after your treatment, we encourage you to take your nausea medication as prescribed.   If you develop nausea and vomiting that is not controlled by your nausea medication, call the clinic.   BELOW ARE SYMPTOMS THAT SHOULD BE REPORTED IMMEDIATELY:  *FEVER GREATER THAN 100.5 F  *CHILLS WITH OR WITHOUT FEVER  NAUSEA AND VOMITING THAT IS NOT CONTROLLED WITH YOUR NAUSEA MEDICATION  *UNUSUAL SHORTNESS OF BREATH  *UNUSUAL BRUISING OR BLEEDING  TENDERNESS IN MOUTH AND THROAT WITH OR WITHOUT PRESENCE OF ULCERS  *URINARY PROBLEMS  *BOWEL PROBLEMS  UNUSUAL RASH Items with * indicate a potential emergency and should be followed up as soon as possible.  Feel free to call the clinic you have any questions or concerns. The clinic phone number is [phone].

## 2014-06-22 NOTE — Progress Notes (Signed)
To provide support, encouragement and care continuity, met with patient during chemo infusion.  She reported that the indigestion and chest pain she experienced per our phone conversation earlier this week had resolved.  I again explained my reason for concern and recommendation she contact MD on call/go to ER.  She verbalized understanding.  She recognized that she is undergoing aggressive chemo treatment and is "giving it my best shot"; I supported her in this decision.  She verbalized understanding that she can call me if needs arise.  Continuing navigation as L1 patient (new patient).  Gayleen Orem, RN, BSN, [facility] Neck On[facility] Navigator [phone]

## 2014-06-26 ENCOUNTER — Ambulatory Visit: Payer: Self-pay

## 2014-06-26 ENCOUNTER — Other Ambulatory Visit: Payer: Self-pay | Admitting: Hematology and Oncology

## 2014-06-27 ENCOUNTER — Other Ambulatory Visit (HOSPITAL_BASED_OUTPATIENT_CLINIC_OR_DEPARTMENT_OTHER): Payer: Medicare Other

## 2014-06-27 ENCOUNTER — Ambulatory Visit (HOSPITAL_BASED_OUTPATIENT_CLINIC_OR_DEPARTMENT_OTHER): Payer: Medicare Other

## 2014-06-27 VITALS — BP 147/70 | HR 66 | Temp 98.3°F | Resp 18

## 2014-06-27 DIAGNOSIS — C139 Malignant neoplasm of hypopharynx, unspecified: Secondary | ICD-10-CM

## 2014-06-27 DIAGNOSIS — Z5111 Encounter for antineoplastic chemotherapy: Secondary | ICD-10-CM

## 2014-06-27 LAB — MAGNESIUM (CC13): Magnesium: 2.1 mg/dl (ref 1.5–2.5)

## 2014-06-27 MED ORDER — HEPARIN SOD (PORK) LOCK FLUSH 100 UNIT/ML IV SOLN
500.0000 [IU] | Freq: Once | INTRAVENOUS | Status: AC | PRN
  Administered 2014-06-27: 500 [IU]
  Filled 2014-06-27: qty 5

## 2014-06-27 MED ORDER — SODIUM CHLORIDE 0.9 % IV SOLN
Freq: Once | INTRAVENOUS | Status: AC
  Administered 2014-06-27: 09:00:00 via INTRAVENOUS

## 2014-06-27 MED ORDER — DIPHENHYDRAMINE HCL 50 MG/ML IJ SOLN
50.0000 mg | Freq: Once | INTRAMUSCULAR | Status: AC
  Administered 2014-06-27: 50 mg via INTRAVENOUS

## 2014-06-27 MED ORDER — SODIUM CHLORIDE 0.9 % IJ SOLN
10.0000 mL | INTRAMUSCULAR | Status: DC | PRN
  Administered 2014-06-27: 10 mL
  Filled 2014-06-27: qty 10

## 2014-06-27 MED ORDER — DIPHENHYDRAMINE HCL 50 MG/ML IJ SOLN
INTRAMUSCULAR | Status: AC
  Filled 2014-06-27: qty 1

## 2014-06-27 MED ORDER — CETUXIMAB CHEMO IV INJECTION 200 MG/100ML
250.0000 mg/m2 | Freq: Once | INTRAVENOUS | Status: AC
  Administered 2014-06-27: 400 mg via INTRAVENOUS
  Filled 2014-06-27: qty 200

## 2014-06-27 NOTE — Patient Instructions (Signed)
[facility] Discharge Instructions for Patients Receiving Chemotherapy  Today you received the following chemotherapy agents Erbitux.  To help prevent nausea and vomiting after your treatment, we encourage you to take your nausea medication as prescribed.   If you develop nausea and vomiting that is not controlled by your nausea medication, call the clinic.   BELOW ARE SYMPTOMS THAT SHOULD BE REPORTED IMMEDIATELY:  *FEVER GREATER THAN 100.5 F  *CHILLS WITH OR WITHOUT FEVER  NAUSEA AND VOMITING THAT IS NOT CONTROLLED WITH YOUR NAUSEA MEDICATION  *UNUSUAL SHORTNESS OF BREATH  *UNUSUAL BRUISING OR BLEEDING  TENDERNESS IN MOUTH AND THROAT WITH OR WITHOUT PRESENCE OF ULCERS  *URINARY PROBLEMS  *BOWEL PROBLEMS  UNUSUAL RASH Items with * indicate a potential emergency and should be followed up as soon as possible.  Feel free to call the clinic you have any questions or concerns. The clinic phone number is [phone].

## 2014-07-03 ENCOUNTER — Ambulatory Visit: Payer: Self-pay

## 2014-07-04 ENCOUNTER — Encounter: Payer: Self-pay | Admitting: Hematology and Oncology

## 2014-07-04 ENCOUNTER — Ambulatory Visit (HOSPITAL_BASED_OUTPATIENT_CLINIC_OR_DEPARTMENT_OTHER): Payer: Medicare Other

## 2014-07-04 ENCOUNTER — Other Ambulatory Visit (HOSPITAL_BASED_OUTPATIENT_CLINIC_OR_DEPARTMENT_OTHER): Payer: Medicare Other

## 2014-07-04 ENCOUNTER — Ambulatory Visit (HOSPITAL_BASED_OUTPATIENT_CLINIC_OR_DEPARTMENT_OTHER): Payer: Medicare Other | Admitting: Hematology and Oncology

## 2014-07-04 ENCOUNTER — Encounter: Payer: Self-pay | Admitting: *Deleted

## 2014-07-04 VITALS — BP 136/73 | HR 73 | Temp 98.3°F | Resp 18 | Ht 61.0 in | Wt 129.6 lb

## 2014-07-04 DIAGNOSIS — D630 Anemia in neoplastic disease: Secondary | ICD-10-CM

## 2014-07-04 DIAGNOSIS — R21 Rash and other nonspecific skin eruption: Secondary | ICD-10-CM

## 2014-07-04 DIAGNOSIS — C139 Malignant neoplasm of hypopharynx, unspecified: Secondary | ICD-10-CM

## 2014-07-04 DIAGNOSIS — L709 Acne, unspecified: Secondary | ICD-10-CM

## 2014-07-04 DIAGNOSIS — K1231 Oral mucositis (ulcerative) due to antineoplastic therapy: Secondary | ICD-10-CM

## 2014-07-04 DIAGNOSIS — D6481 Anemia due to antineoplastic chemotherapy: Secondary | ICD-10-CM | POA: Insufficient documentation

## 2014-07-04 DIAGNOSIS — D701 Agranulocytosis secondary to cancer chemotherapy: Secondary | ICD-10-CM

## 2014-07-04 DIAGNOSIS — Z5111 Encounter for antineoplastic chemotherapy: Secondary | ICD-10-CM

## 2014-07-04 DIAGNOSIS — Z5112 Encounter for antineoplastic immunotherapy: Secondary | ICD-10-CM

## 2014-07-04 DIAGNOSIS — T451X5A Adverse effect of antineoplastic and immunosuppressive drugs, initial encounter: Secondary | ICD-10-CM

## 2014-07-04 HISTORY — DX: Rash and other nonspecific skin eruption: R21

## 2014-07-04 HISTORY — DX: Acne, unspecified: L70.9

## 2014-07-04 LAB — CBC WITH DIFFERENTIAL/PLATELET
BASO%: 1.4 % (ref 0.0–2.0)
Basophils Absolute: 0 10*3/uL (ref 0.0–0.1)
EOS%: 7.1 % — ABNORMAL HIGH (ref 0.0–7.0)
Eosinophils Absolute: 0.2 10*3/uL (ref 0.0–0.5)
HCT: 35.3 % (ref 34.8–46.6)
HGB: 11.5 g/dL — ABNORMAL LOW (ref 11.6–15.9)
LYMPH#: 1.1 10*3/uL (ref 0.9–3.3)
LYMPH%: 38.9 % (ref 14.0–49.7)
MCH: 30.3 pg (ref 25.1–34.0)
MCHC: 32.6 g/dL (ref 31.5–36.0)
MCV: 93.1 fL (ref 79.5–101.0)
MONO#: 0.3 10*3/uL (ref 0.1–0.9)
MONO%: 9.9 % (ref 0.0–14.0)
NEUT#: 1.2 10*3/uL — ABNORMAL LOW (ref 1.5–6.5)
NEUT%: 42.7 % (ref 38.4–76.8)
NRBC: 0 % (ref 0–0)
Platelets: 137 10*3/uL — ABNORMAL LOW (ref 145–400)
RBC: 3.79 10*6/uL (ref 3.70–5.45)
RDW: 15.4 % — AB (ref 11.2–14.5)
WBC: 2.8 10*3/uL — AB (ref 3.9–10.3)

## 2014-07-04 LAB — COMPREHENSIVE METABOLIC PANEL (CC13)
ALBUMIN: 3.7 g/dL (ref 3.5–5.0)
ALT: 14 U/L (ref 0–55)
ANION GAP: 10 meq/L (ref 3–11)
AST: 18 U/L (ref 5–34)
Alkaline Phosphatase: 89 U/L (ref 40–150)
BILIRUBIN TOTAL: 0.23 mg/dL (ref 0.20–1.20)
BUN: 11.4 mg/dL (ref 7.0–26.0)
CALCIUM: 9.3 mg/dL (ref 8.4–10.4)
CHLORIDE: 107 meq/L (ref 98–109)
CO2: 24 meq/L (ref 22–29)
Creatinine: 0.8 mg/dL (ref 0.6–1.1)
GLUCOSE: 95 mg/dL (ref 70–140)
POTASSIUM: 3.8 meq/L (ref 3.5–5.1)
SODIUM: 142 meq/L (ref 136–145)
TOTAL PROTEIN: 6.7 g/dL (ref 6.4–8.3)

## 2014-07-04 LAB — MAGNESIUM (CC13): Magnesium: 2.3 mg/dl (ref 1.5–2.5)

## 2014-07-04 MED ORDER — DEXAMETHASONE SODIUM PHOSPHATE 20 MG/5ML IJ SOLN
20.0000 mg | Freq: Once | INTRAMUSCULAR | Status: AC
  Administered 2014-07-04: 20 mg via INTRAVENOUS

## 2014-07-04 MED ORDER — DIPHENHYDRAMINE HCL 50 MG/ML IJ SOLN
50.0000 mg | Freq: Once | INTRAMUSCULAR | Status: AC
  Administered 2014-07-04: 50 mg via INTRAVENOUS

## 2014-07-04 MED ORDER — DEXAMETHASONE SODIUM PHOSPHATE 20 MG/5ML IJ SOLN
INTRAMUSCULAR | Status: AC
  Filled 2014-07-04: qty 5

## 2014-07-04 MED ORDER — ONDANSETRON 16 MG/50ML IVPB (CHCC)
INTRAVENOUS | Status: AC
  Filled 2014-07-04: qty 16

## 2014-07-04 MED ORDER — SODIUM CHLORIDE 0.9 % IV SOLN
Freq: Once | INTRAVENOUS | Status: AC
  Administered 2014-07-04: 10:00:00 via INTRAVENOUS

## 2014-07-04 MED ORDER — CLINDAMYCIN PHOSPHATE 1 % EX GEL: Freq: Two times a day (BID) | CUTANEOUS | Status: DC

## 2014-07-04 MED ORDER — CETUXIMAB CHEMO IV INJECTION 200 MG/100ML
250.0000 mg/m2 | Freq: Once | INTRAVENOUS | Status: AC
  Administered 2014-07-04: 400 mg via INTRAVENOUS
  Filled 2014-07-04: qty 200

## 2014-07-04 MED ORDER — ONDANSETRON 16 MG/50ML IVPB (CHCC)
16.0000 mg | Freq: Once | INTRAVENOUS | Status: AC
  Administered 2014-07-04: 16 mg via INTRAVENOUS

## 2014-07-04 MED ORDER — SODIUM CHLORIDE 0.9 % IV SOLN
1000.0000 mg/m2/d | INTRAVENOUS | Status: DC
  Administered 2014-07-04: 6000 mg via INTRAVENOUS
  Filled 2014-07-04: qty 120

## 2014-07-04 MED ORDER — HYDROCORTISONE 1 % EX OINT: 1.0000 "application " | TOPICAL_OINTMENT | Freq: Two times a day (BID) | CUTANEOUS | Status: AC

## 2014-07-04 MED ORDER — CARBOPLATIN CHEMO INJECTION 450 MG/45ML
390.0000 mg | Freq: Once | INTRAVENOUS | Status: AC
  Administered 2014-07-04: 390 mg via INTRAVENOUS
  Filled 2014-07-04: qty 39

## 2014-07-04 MED ORDER — SODIUM CHLORIDE 0.9 % IV SOLN: Freq: Once | INTRAVENOUS | Status: AC

## 2014-07-04 MED ORDER — DIPHENHYDRAMINE HCL 50 MG/ML IJ SOLN
INTRAMUSCULAR | Status: AC
  Filled 2014-07-04: qty 1

## 2014-07-04 NOTE — Progress Notes (Signed)
Okay to treat today, despite labs, per Dr. Alvy Bimler.

## 2014-07-04 NOTE — Assessment & Plan Note (Signed)
She will continue conservative management.

## 2014-07-04 NOTE — Patient Instructions (Signed)
[facility] Discharge Instructions for Patients Receiving Chemotherapy  Today you received the following chemotherapy agents Erbitux, Carboplatin, 5-FU  To help prevent nausea and vomiting after your treatment, we encourage you to take your nausea medication     If you develop nausea and vomiting that is not controlled by your nausea medication, call the clinic.   BELOW ARE SYMPTOMS THAT SHOULD BE REPORTED IMMEDIATELY:  *FEVER GREATER THAN 100.5 F  *CHILLS WITH OR WITHOUT FEVER  NAUSEA AND VOMITING THAT IS NOT CONTROLLED WITH YOUR NAUSEA MEDICATION  *UNUSUAL SHORTNESS OF BREATH  *UNUSUAL BRUISING OR BLEEDING  TENDERNESS IN MOUTH AND THROAT WITH OR WITHOUT PRESENCE OF ULCERS  *URINARY PROBLEMS  *BOWEL PROBLEMS  UNUSUAL RASH Items with * indicate a potential emergency and should be followed up as soon as possible.  Feel free to call the clinic you have any questions or concerns. The clinic phone number is [phone].

## 2014-07-04 NOTE — Assessment & Plan Note (Signed)
She tolerated treatment well apart from minor side effects. I will proceed without dose adjustment. I plan to restage her after 3 cycles of chemotherapy.

## 2014-07-04 NOTE — Assessment & Plan Note (Signed)
This is likely due to recent treatment. The patient denies recent history of fevers, cough, chills, diarrhea or dysuria. She is asymptomatic from the leukopenia. I will observe for now.  I will continue the chemotherapy at current dose without dosage adjustment.  If the leukopenia gets progressive worse in the future, I might have to delay her treatment or adjust the chemotherapy dose.   

## 2014-07-04 NOTE — Assessment & Plan Note (Signed)

## 2014-07-04 NOTE — Progress Notes (Signed)
[facility] OFFICE PROGRESS NOTE  Patient Care Team: Baruch Goldmann, [place] as PCP - General (Physician Assistant) Thea Silversmith, MD (Radiation Oncology) Jerrell Belfast, MD (Otolaryngology) Heath Lark, MD as Consulting Physician (Hematology and Oncology) Brooks Sailors, RN as Oncology Nurse Navigator (Oncology)  SUMMARY OF ONCOLOGIC HISTORY: Oncology History   Cancer of hypopharynx, base of tongue, squamous cell carcinoma, HPV focally positive   Primary site: Pharynx - Hypopharynx   Staging method: AJCC 7th Edition   Clinical: Stage III (T2, N1, M0) signed by Thea Silversmith, MD on 10/07/2011 11:50 AM   Summary: Stage III (T2, N1, M0)       Cancer of hypopharynx   07/21/2011 Pathology Results Neck fine needle aspirate was negative for malignancy.   09/03/2011 Pathology Results Right neck fine needle aspirate and biopsy confirmed malignancy, squamous cell carcinoma, HPV marginally positive.   09/30/2011 Imaging PET CT scan confirmed right hypopharynx mass with regional lymphadenopathy.   10/28/2011 - 12/17/2011 Radiation Therapy The patient completed radiation therapy.   10/28/2011 - 12/23/2011 Chemotherapy The patient received 3 doses of high dose cisplatin. Her last dose of chemotherapy have 25% dose adjustment due to side effects.   03/16/2012 Imaging Repeat PET scan confirmed near complete response to treatment.   05/23/2012 Imaging Repeat PET/CT scan confirmed recurrence of disease in the right neck lymph node.   06/14/2012 Pathology Results Pathology confirmed only one lymph node involvement.   06/14/2012 Surgery The patient underwent right neck dissection for residual and recurrence of disease.   04/17/2014 Imaging Chest x-ray show multiple pulmonary nodules. CT scan of the neck showed no evidence of recurrence of disease.   04/18/2014 Imaging CT chest confirmed multiple pulmonary nodules highly suspicious for metastatic disease.   05/21/2014 Imaging PET CT scan showed multiple  pulmonary nodules which are hypermetabolic.   05/27/2014 Procedure [id number] CT-guided biopsy confirms malignant cells, compatible with squamous cell carcinoma.   06/13/2014 -  Chemotherapy She is started on palliative treatment with carboplatin, 5-FU and Erbitux    INTERVAL HISTORY: Please see below for problem oriented charting. She is seen prior to cycle 2 of chemotherapy. She developed acneform rash on the face. She has no rash elsewhere. She had memory mild mucositis, resolved with conservative management. She only had one day of diarrhea which resolved spontaneously.  REVIEW OF SYSTEMS:   Constitutional: Denies fevers, chills or abnormal weight loss Eyes: Denies blurriness of vision Respiratory: Denies cough, dyspnea or wheezes Cardiovascular: Denies palpitation, chest discomfort or lower extremity swelling Lymphatics: Denies new lymphadenopathy or easy bruising Neurological:Denies numbness, tingling or new weaknesses Behavioral/Psych: Mood is stable, no new changes  All other systems were reviewed with the patient and are negative.  I have reviewed the past medical history, past surgical history, social history and family history with the patient and they are unchanged from previous note.  ALLERGIES:  has No Known Allergies.  MEDICATIONS:  Current Outpatient Prescriptions  Medication Sig Dispense Refill  . Alum & Mag Hydroxide-Simeth (MAGIC MOUTHWASH) SOLN Take 5 mLs by mouth 4 (four) times daily as needed for mouth pain.  240 mL  2  . calcium carbonate (OS-CAL) 1250 MG chewable tablet Chew 1 tablet by mouth daily.       . Cholecalciferol (VITAMIN D) 2000 UNITS CAPS Take 2,000 Units by mouth daily.       . fexofenadine-pseudoephedrine (ALLEGRA-D 24) 180-240 MG per 24 hr tablet Take 1 tablet by mouth daily as needed (for allergies).       Marland Kitchen  lidocaine-prilocaine (EMLA) cream Apply 1 application topically as needed.  30 g  6  . omeprazole (PRILOSEC) 40 MG capsule Take 40 mg by  mouth daily as needed (Acid reflux).       . ondansetron (ZOFRAN) 8 MG tablet Take 1 tablet (8 mg total) by mouth every 8 (eight) hours as needed.  60 tablet  1  . OVER THE COUNTER MEDICATION Take 1 tablet by mouth daily. MED NAME: Vitamin B-17      . oxyCODONE (OXY IR/ROXICODONE) 5 MG immediate release tablet Take 1 tablet (5 mg total) by mouth every 4 (four) hours as needed for severe pain.  30 tablet  0  . Polyethyl Glycol-Propyl Glycol (SYSTANE OP) Place 1 drop into both eyes 3 (three) times daily as needed (for allergies).       . pravastatin (PRAVACHOL) 40 MG tablet Take 40 mg by mouth daily.      . prochlorperazine (COMPAZINE) 10 MG tablet Take 1 tablet (10 mg total) by mouth every 6 (six) hours as needed (Nausea or vomiting).  30 tablet  1  . clindamycin (CLINDAGEL) 1 % gel Apply topically 2 (two) times daily.  30 g  0  . hydrocortisone 1 % ointment Apply 1 application topically 2 (two) times daily.  30 g  0   No current facility-administered medications for this visit.    PHYSICAL EXAMINATION: ECOG PERFORMANCE STATUS: 1 - Symptomatic but completely ambulatory  Filed Vitals:   07/04/14 0917  BP: 136/73  Pulse: 73  Temp: 98.3 F (36.8 C)  Resp: 18   Filed Weights   07/04/14 0917  Weight: 129 lb 9.6 oz (58.786 kg)    GENERAL:alert, no distress and comfortable SKIN: Mild acneform rash. EYES: normal, Conjunctiva are pink and non-injected, sclera clear OROPHARYNX:no exudate, no erythema and lips, buccal mucosa, and tongue normal  NECK: Well-healed surgical scar, thyroid normal size, non-tender, without nodularity LYMPH:  no palpable lymphadenopathy in the cervical, axillary or inguinal LUNGS: clear to auscultation and percussion with normal breathing effort HEART: regular rate & rhythm and no murmurs and no lower extremity edema ABDOMEN:abdomen soft, non-tender and normal bowel sounds Musculoskeletal:no cyanosis of digits and no clubbing  NEURO: alert & oriented x 3 with  fluent speech, no focal motor/sensory deficits  LABORATORY DATA:  I have reviewed the data as listed    Component Value Date/Time   NA 138 06/11/2014 1233   NA 140 06/07/2012 1241   K 3.9 06/11/2014 1233   K 3.9 06/07/2012 1241   CL 103 04/17/2013 0803   CL 103 06/07/2012 1241   CO2 27 06/11/2014 1233   CO2 28 06/07/2012 1241   GLUCOSE 90 06/11/2014 1233   GLUCOSE 78 04/17/2013 0803   GLUCOSE 91 06/07/2012 1241   BUN 10.8 06/11/2014 1233   BUN 16 06/07/2012 1241   CREATININE 0.8 06/11/2014 1233   CREATININE 1.05 06/07/2012 1241   CALCIUM 9.3 06/11/2014 1233   CALCIUM 9.3 06/07/2012 1241   PROT 7.1 06/11/2014 1233   PROT 6.3 03/17/2012 0824   ALBUMIN 3.9 06/11/2014 1233   ALBUMIN 4.1 03/17/2012 0824   AST 18 06/11/2014 1233   AST 16 03/17/2012 0824   ALT 12 06/11/2014 1233   ALT <8 03/17/2012 0824   ALKPHOS 75 06/11/2014 1233   ALKPHOS 61 03/17/2012 0824   BILITOT 0.35 06/11/2014 1233   BILITOT 0.2* 03/17/2012 0824   GFRNONAA 55* 06/07/2012 1241   GFRAA 64* 06/07/2012 1241    No results found  for this basename: SPEP, UPEP,  kappa and lambda light chains    Lab Results  Component Value Date   WBC 2.8* 07/04/2014   NEUTROABS 1.2* 07/04/2014   HGB 11.5* 07/04/2014   HCT 35.3 07/04/2014   MCV 93.1 07/04/2014   PLT 137* 07/04/2014      Chemistry      Component Value Date/Time   NA 138 06/11/2014 1233   NA 140 06/07/2012 1241   K 3.9 06/11/2014 1233   K 3.9 06/07/2012 1241   CL 103 04/17/2013 0803   CL 103 06/07/2012 1241   CO2 27 06/11/2014 1233   CO2 28 06/07/2012 1241   BUN 10.8 06/11/2014 1233   BUN 16 06/07/2012 1241   CREATININE 0.8 06/11/2014 1233   CREATININE 1.05 06/07/2012 1241      Component Value Date/Time   CALCIUM 9.3 06/11/2014 1233   CALCIUM 9.3 06/07/2012 1241   ALKPHOS 75 06/11/2014 1233   ALKPHOS 61 03/17/2012 0824   AST 18 06/11/2014 1233   AST 16 03/17/2012 0824   ALT 12 06/11/2014 1233   ALT <8 03/17/2012 0824   BILITOT 0.35 06/11/2014 1233   BILITOT 0.2* 03/17/2012 0824     ASSESSMENT &  PLAN:  Cancer of hypopharynx She tolerated treatment well apart from minor side effects. I will proceed without dose adjustment. I plan to restage her after 3 cycles of chemotherapy.  Rash of face This is due to side effects of Erbitux. I recommend topical cream with hydrocortisone and clindamycin gel.  Mucositis (ulcerative) due to antineoplastic therapy She will continue conservative management.  Leukopenia due to antineoplastic chemotherapy This is likely due to recent treatment. The patient denies recent history of fevers, cough, chills, diarrhea or dysuria. She is asymptomatic from the leukopenia. I will observe for now.  I will continue the chemotherapy at current dose without dosage adjustment.  If the leukopenia gets progressive worse in the future, I might have to delay her treatment or adjust the chemotherapy dose.    Anemia in neoplastic disease This is likely due to recent treatment. The patient denies recent history of bleeding such as epistaxis, hematuria or hematochezia. She is asymptomatic from the anemia. I will observe for now.  She does not require transfusion now. I will continue the chemotherapy at current dose without dosage adjustment.  If the anemia gets progressive worse in the future, I might have to delay her treatment or adjust the chemotherapy dose.      Orders Placed This Encounter  Procedures  . CBC with Differential    Standing Status: Standing     Number of Occurrences: 9     Standing Expiration Date: 07/05/2015  . Comprehensive metabolic panel    Standing Status: Standing     Number of Occurrences: 9     Standing Expiration Date: 07/05/2015   All questions were answered. The patient knows to call the clinic with any problems, questions or concerns. No barriers to learning was detected.    [facility], [place], MD 07/04/2014 9:32 AM

## 2014-07-04 NOTE — Assessment & Plan Note (Signed)
This is due to side effects of Erbitux. I recommend topical cream with hydrocortisone and clindamycin gel.

## 2014-07-04 NOTE — Progress Notes (Signed)
OK to treat with current CBC

## 2014-07-05 ENCOUNTER — Telehealth: Payer: Self-pay | Admitting: *Deleted

## 2014-07-05 ENCOUNTER — Telehealth: Payer: Self-pay | Admitting: Hematology and Oncology

## 2014-07-05 NOTE — Telephone Encounter (Signed)
lvm for pt regarding picking up new sched.Marland KitchenMarland KitchenMarland Kitchen

## 2014-07-05 NOTE — Telephone Encounter (Signed)
Per staff message and POF I have scheduled appts. Advised scheduler of appts. JMW  

## 2014-07-08 ENCOUNTER — Ambulatory Visit (HOSPITAL_BASED_OUTPATIENT_CLINIC_OR_DEPARTMENT_OTHER): Payer: Medicare Other

## 2014-07-08 DIAGNOSIS — C139 Malignant neoplasm of hypopharynx, unspecified: Secondary | ICD-10-CM

## 2014-07-08 MED ORDER — SODIUM CHLORIDE 0.9 % IJ SOLN
10.0000 mL | INTRAMUSCULAR | Status: DC | PRN
  Administered 2014-07-08: 10 mL
  Filled 2014-07-08: qty 10

## 2014-07-08 MED ORDER — HEPARIN SOD (PORK) LOCK FLUSH 100 UNIT/ML IV SOLN
500.0000 [IU] | Freq: Once | INTRAVENOUS | Status: AC | PRN
  Administered 2014-07-08: 500 [IU]
  Filled 2014-07-08: qty 5

## 2014-07-08 NOTE — Patient Instructions (Signed)

## 2014-07-10 ENCOUNTER — Ambulatory Visit: Payer: Self-pay

## 2014-07-11 ENCOUNTER — Ambulatory Visit (HOSPITAL_BASED_OUTPATIENT_CLINIC_OR_DEPARTMENT_OTHER): Payer: Medicare Other

## 2014-07-11 ENCOUNTER — Other Ambulatory Visit: Payer: Self-pay | Admitting: Hematology and Oncology

## 2014-07-11 VITALS — BP 105/78 | HR 88 | Temp 97.8°F | Resp 20

## 2014-07-11 DIAGNOSIS — C139 Malignant neoplasm of hypopharynx, unspecified: Secondary | ICD-10-CM

## 2014-07-11 DIAGNOSIS — R21 Rash and other nonspecific skin eruption: Secondary | ICD-10-CM

## 2014-07-11 DIAGNOSIS — Z5112 Encounter for antineoplastic immunotherapy: Secondary | ICD-10-CM

## 2014-07-11 LAB — CBC WITH DIFFERENTIAL/PLATELET
BASO%: 0.7 % (ref 0.0–2.0)
Basophils Absolute: 0 10*3/uL (ref 0.0–0.1)
EOS%: 5.4 % (ref 0.0–7.0)
Eosinophils Absolute: 0.2 10*3/uL (ref 0.0–0.5)
HCT: 34.7 % — ABNORMAL LOW (ref 34.8–46.6)
HGB: 11.1 g/dL — ABNORMAL LOW (ref 11.6–15.9)
LYMPH%: 26.5 % (ref 14.0–49.7)
MCH: 30.3 pg (ref 25.1–34.0)
MCHC: 31.9 g/dL (ref 31.5–36.0)
MCV: 95 fL (ref 79.5–101.0)
MONO#: 0.2 10*3/uL (ref 0.1–0.9)
MONO%: 4.5 % (ref 0.0–14.0)
NEUT%: 62.9 % (ref 38.4–76.8)
NEUTROS ABS: 2.2 10*3/uL (ref 1.5–6.5)
Platelets: 156 10*3/uL (ref 145–400)
RBC: 3.66 10*6/uL — AB (ref 3.70–5.45)
RDW: 14.8 % — ABNORMAL HIGH (ref 11.2–14.5)
WBC: 3.5 10*3/uL — AB (ref 3.9–10.3)
lymph#: 0.9 10*3/uL (ref 0.9–3.3)

## 2014-07-11 LAB — COMPREHENSIVE METABOLIC PANEL (CC13)
ALK PHOS: 76 U/L (ref 40–150)
ALT: 15 U/L (ref 0–55)
ANION GAP: 7 meq/L (ref 3–11)
AST: 19 U/L (ref 5–34)
Albumin: 3.7 g/dL (ref 3.5–5.0)
BILIRUBIN TOTAL: 0.49 mg/dL (ref 0.20–1.20)
BUN: 18.6 mg/dL (ref 7.0–26.0)
CALCIUM: 9.2 mg/dL (ref 8.4–10.4)
CO2: 28 mEq/L (ref 22–29)
Chloride: 107 mEq/L (ref 98–109)
Creatinine: 1 mg/dL (ref 0.6–1.1)
Glucose: 98 mg/dl (ref 70–140)
Potassium: 4.1 mEq/L (ref 3.5–5.1)
SODIUM: 141 meq/L (ref 136–145)
Total Protein: 6.6 g/dL (ref 6.4–8.3)

## 2014-07-11 LAB — MAGNESIUM (CC13): Magnesium: 2.2 mg/dl (ref 1.5–2.5)

## 2014-07-11 MED ORDER — DIPHENHYDRAMINE HCL 50 MG/ML IJ SOLN
50.0000 mg | Freq: Once | INTRAMUSCULAR | Status: AC
  Administered 2014-07-11: 50 mg via INTRAVENOUS

## 2014-07-11 MED ORDER — HEPARIN SOD (PORK) LOCK FLUSH 100 UNIT/ML IV SOLN
500.0000 [IU] | Freq: Once | INTRAVENOUS | Status: AC | PRN
  Administered 2014-07-11: 500 [IU]
  Filled 2014-07-11: qty 5

## 2014-07-11 MED ORDER — CETUXIMAB CHEMO IV INJECTION 200 MG/100ML
250.0000 mg/m2 | Freq: Once | INTRAVENOUS | Status: AC
  Administered 2014-07-11: 400 mg via INTRAVENOUS
  Filled 2014-07-11: qty 200

## 2014-07-11 MED ORDER — SODIUM CHLORIDE 0.9 % IJ SOLN
10.0000 mL | INTRAMUSCULAR | Status: DC | PRN
  Administered 2014-07-11: 10 mL
  Filled 2014-07-11: qty 10

## 2014-07-11 MED ORDER — DIPHENHYDRAMINE HCL 50 MG/ML IJ SOLN
INTRAMUSCULAR | Status: AC
Start: 2014-07-11 — End: 2014-07-11
  Filled 2014-07-11: qty 1

## 2014-07-11 MED ORDER — SODIUM CHLORIDE 0.9 % IV SOLN
Freq: Once | INTRAVENOUS | Status: AC
  Administered 2014-07-11: 13:00:00 via INTRAVENOUS

## 2014-07-11 NOTE — Patient Instructions (Signed)
[facility] Discharge Instructions for Patients Receiving Chemotherapy  Today you received the following chemotherapy agents Erbitux  To help prevent nausea and vomiting after your treatment, we encourage you to take your nausea medication as prescribed.   If you develop nausea and vomiting that is not controlled by your nausea medication, call the clinic.   BELOW ARE SYMPTOMS THAT SHOULD BE REPORTED IMMEDIATELY:  *FEVER GREATER THAN 100.5 F  *CHILLS WITH OR WITHOUT FEVER  NAUSEA AND VOMITING THAT IS NOT CONTROLLED WITH YOUR NAUSEA MEDICATION  *UNUSUAL SHORTNESS OF BREATH  *UNUSUAL BRUISING OR BLEEDING  TENDERNESS IN MOUTH AND THROAT WITH OR WITHOUT PRESENCE OF ULCERS  *URINARY PROBLEMS  *BOWEL PROBLEMS  UNUSUAL RASH Items with * indicate a potential emergency and should be followed up as soon as possible.  Feel free to call the clinic you have any questions or concerns. The clinic phone number is [phone].

## 2014-07-18 ENCOUNTER — Ambulatory Visit (HOSPITAL_BASED_OUTPATIENT_CLINIC_OR_DEPARTMENT_OTHER): Payer: Medicare Other

## 2014-07-18 ENCOUNTER — Encounter: Payer: Self-pay | Admitting: *Deleted

## 2014-07-18 VITALS — BP 118/71 | HR 77 | Temp 98.1°F | Resp 16

## 2014-07-18 DIAGNOSIS — Z5112 Encounter for antineoplastic immunotherapy: Secondary | ICD-10-CM

## 2014-07-18 DIAGNOSIS — C139 Malignant neoplasm of hypopharynx, unspecified: Secondary | ICD-10-CM

## 2014-07-18 MED ORDER — CETUXIMAB CHEMO IV INJECTION 200 MG/100ML
250.0000 mg/m2 | Freq: Once | INTRAVENOUS | Status: AC
  Administered 2014-07-18: 400 mg via INTRAVENOUS
  Filled 2014-07-18: qty 200

## 2014-07-18 MED ORDER — HEPARIN SOD (PORK) LOCK FLUSH 100 UNIT/ML IV SOLN
500.0000 [IU] | Freq: Once | INTRAVENOUS | Status: AC | PRN
  Administered 2014-07-18: 500 [IU]
  Filled 2014-07-18: qty 5

## 2014-07-18 MED ORDER — SODIUM CHLORIDE 0.9 % IV SOLN
Freq: Once | INTRAVENOUS | Status: AC
  Administered 2014-07-18: 15:00:00 via INTRAVENOUS

## 2014-07-18 MED ORDER — DIPHENHYDRAMINE HCL 50 MG/ML IJ SOLN
50.0000 mg | Freq: Once | INTRAMUSCULAR | Status: AC
  Administered 2014-07-18: 50 mg via INTRAVENOUS

## 2014-07-18 MED ORDER — SODIUM CHLORIDE 0.9 % IJ SOLN
10.0000 mL | INTRAMUSCULAR | Status: DC | PRN
  Administered 2014-07-18: 10 mL
  Filled 2014-07-18: qty 10

## 2014-07-18 MED ORDER — DIPHENHYDRAMINE HCL 50 MG/ML IJ SOLN
INTRAMUSCULAR | Status: AC
  Filled 2014-07-18: qty 1

## 2014-07-18 NOTE — Patient Instructions (Signed)
[facility] Discharge Instructions for Patients Receiving Chemotherapy  Today you received the following chemotherapy agents Erbitux.   To help prevent nausea and vomiting after your treatment, we encourage you to take your nausea medication as directed.    If you develop nausea and vomiting that is not controlled by your nausea medication, call the clinic.   BELOW ARE SYMPTOMS THAT SHOULD BE REPORTED IMMEDIATELY:  *FEVER GREATER THAN 100.5 F  *CHILLS WITH OR WITHOUT FEVER  NAUSEA AND VOMITING THAT IS NOT CONTROLLED WITH YOUR NAUSEA MEDICATION  *UNUSUAL SHORTNESS OF BREATH  *UNUSUAL BRUISING OR BLEEDING  TENDERNESS IN MOUTH AND THROAT WITH OR WITHOUT PRESENCE OF ULCERS  *URINARY PROBLEMS  *BOWEL PROBLEMS  UNUSUAL RASH Items with * indicate a potential emergency and should be followed up as soon as possible.  Feel free to call the clinic you have any questions or concerns. The clinic phone number is [phone].

## 2014-07-18 NOTE — Progress Notes (Signed)
Patient decline post observation period after Erbitux.

## 2014-07-18 NOTE — Progress Notes (Signed)
Do not labs for treatment today 07/18/14 Per MD Alvy Bimler

## 2014-07-19 NOTE — Progress Notes (Addendum)
To provide support and encouragement, care continuity and to assess for needs, met with patient during her chemo infusion.  "Only one more to go."  Other than fatigue, she reported that she has not experienced and SEs from chemo.  She expressed a very positive attitude as she continues with her treatment.  She denied any needs or concerns; I encouraged her to contact me if that changes before I see her next.  Continuing navigation as L2 patient (treatments established).  Gayleen Orem, RN, BSN, [place] [facility] [phone]

## 2014-07-25 ENCOUNTER — Other Ambulatory Visit (HOSPITAL_BASED_OUTPATIENT_CLINIC_OR_DEPARTMENT_OTHER): Payer: Medicare Other

## 2014-07-25 ENCOUNTER — Ambulatory Visit: Payer: Self-pay

## 2014-07-25 ENCOUNTER — Ambulatory Visit (HOSPITAL_BASED_OUTPATIENT_CLINIC_OR_DEPARTMENT_OTHER): Payer: Medicare Other | Admitting: Hematology and Oncology

## 2014-07-25 ENCOUNTER — Encounter: Payer: Self-pay | Admitting: *Deleted

## 2014-07-25 ENCOUNTER — Telehealth: Payer: Self-pay | Admitting: Hematology and Oncology

## 2014-07-25 ENCOUNTER — Telehealth: Payer: Self-pay | Admitting: *Deleted

## 2014-07-25 ENCOUNTER — Other Ambulatory Visit: Payer: Self-pay | Admitting: Hematology and Oncology

## 2014-07-25 VITALS — BP 116/63 | HR 78 | Temp 98.0°F | Resp 18 | Ht 61.0 in | Wt 127.3 lb

## 2014-07-25 DIAGNOSIS — C139 Malignant neoplasm of hypopharynx, unspecified: Secondary | ICD-10-CM

## 2014-07-25 DIAGNOSIS — R21 Rash and other nonspecific skin eruption: Secondary | ICD-10-CM

## 2014-07-25 DIAGNOSIS — T451X5A Adverse effect of antineoplastic and immunosuppressive drugs, initial encounter: Secondary | ICD-10-CM

## 2014-07-25 DIAGNOSIS — D72819 Decreased white blood cell count, unspecified: Secondary | ICD-10-CM

## 2014-07-25 DIAGNOSIS — D701 Agranulocytosis secondary to cancer chemotherapy: Secondary | ICD-10-CM

## 2014-07-25 LAB — CBC WITH DIFFERENTIAL/PLATELET
BASO%: 1.4 % (ref 0.0–2.0)
BASOS ABS: 0 10*3/uL (ref 0.0–0.1)
EOS ABS: 0.1 10*3/uL (ref 0.0–0.5)
EOS%: 4.4 % (ref 0.0–7.0)
HCT: 36 % (ref 34.8–46.6)
HGB: 11.7 g/dL (ref 11.6–15.9)
LYMPH%: 47.7 % (ref 14.0–49.7)
MCH: 31.3 pg (ref 25.1–34.0)
MCHC: 32.4 g/dL (ref 31.5–36.0)
MCV: 96.6 fL (ref 79.5–101.0)
MONO#: 0.2 10*3/uL (ref 0.1–0.9)
MONO%: 9.8 % (ref 0.0–14.0)
NEUT%: 36.7 % — ABNORMAL LOW (ref 38.4–76.8)
NEUTROS ABS: 0.6 10*3/uL — AB (ref 1.5–6.5)
PLATELETS: 143 10*3/uL — AB (ref 145–400)
RBC: 3.73 10*6/uL (ref 3.70–5.45)
RDW: 16.8 % — ABNORMAL HIGH (ref 11.2–14.5)
WBC: 1.6 10*3/uL — ABNORMAL LOW (ref 3.9–10.3)
lymph#: 0.8 10*3/uL — ABNORMAL LOW (ref 0.9–3.3)

## 2014-07-25 LAB — COMPREHENSIVE METABOLIC PANEL (CC13)
ALK PHOS: 83 U/L (ref 40–150)
ALT: 11 U/L (ref 0–55)
AST: 18 U/L (ref 5–34)
Albumin: 3.8 g/dL (ref 3.5–5.0)
Anion Gap: 9 mEq/L (ref 3–11)
BUN: 12.8 mg/dL (ref 7.0–26.0)
CO2: 25 mEq/L (ref 22–29)
Calcium: 9.3 mg/dL (ref 8.4–10.4)
Chloride: 109 mEq/L (ref 98–109)
Creatinine: 0.8 mg/dL (ref 0.6–1.1)
GLUCOSE: 74 mg/dL (ref 70–140)
Potassium: 3.9 mEq/L (ref 3.5–5.1)
Sodium: 142 mEq/L (ref 136–145)
Total Bilirubin: 0.25 mg/dL (ref 0.20–1.20)
Total Protein: 7 g/dL (ref 6.4–8.3)

## 2014-07-25 LAB — MAGNESIUM (CC13): Magnesium: 2.4 mg/dl (ref 1.5–2.5)

## 2014-07-25 MED ORDER — AMOXICILLIN-POT CLAVULANATE 875-125 MG PO TABS: 1.0000 | ORAL_TABLET | Freq: Two times a day (BID) | ORAL | Status: DC

## 2014-07-25 NOTE — Telephone Encounter (Signed)
Pt confirmed labs/ov per 09/10 POF, sent msg to add chemo, gave pt AVS....KJ

## 2014-07-25 NOTE — Telephone Encounter (Signed)
Per staff message and POF I have scheduled appts. Advised scheduler of appts. JMW  

## 2014-07-25 NOTE — Progress Notes (Signed)
[facility] OFFICE PROGRESS NOTE  Patient Care Team: Baruch Goldmann, [place] as PCP - General (Physician Assistant) Thea Silversmith, MD (Radiation Oncology) Jerrell Belfast, MD (Otolaryngology) Heath Lark, MD as Consulting Physician (Hematology and Oncology) Brooks Sailors, RN as Oncology Nurse Navigator (Oncology)  SUMMARY OF ONCOLOGIC HISTORY: Oncology History   Cancer of hypopharynx, base of tongue, squamous cell carcinoma, HPV focally positive   Primary site: Pharynx - Hypopharynx   Staging method: AJCC 7th Edition   Clinical: Stage III (T2, N1, M0) signed by Thea Silversmith, MD on 10/07/2011 11:50 AM   Summary: Stage III (T2, N1, M0)       Cancer of hypopharynx   07/21/2011 Pathology Results Neck fine needle aspirate was negative for malignancy.   09/03/2011 Pathology Results Right neck fine needle aspirate and biopsy confirmed malignancy, squamous cell carcinoma, HPV marginally positive.   09/30/2011 Imaging PET CT scan confirmed right hypopharynx mass with regional lymphadenopathy.   10/28/2011 - 12/17/2011 Radiation Therapy The patient completed radiation therapy.   10/28/2011 - 12/23/2011 Chemotherapy The patient received 3 doses of high dose cisplatin. Her last dose of chemotherapy have 25% dose adjustment due to side effects.   03/16/2012 Imaging Repeat PET scan confirmed near complete response to treatment.   05/23/2012 Imaging Repeat PET/CT scan confirmed recurrence of disease in the right neck lymph node.   06/14/2012 Pathology Results Pathology confirmed only one lymph node involvement.   06/14/2012 Surgery The patient underwent right neck dissection for residual and recurrence of disease.   04/17/2014 Imaging Chest x-ray show multiple pulmonary nodules. CT scan of the neck showed no evidence of recurrence of disease.   04/18/2014 Imaging CT chest confirmed multiple pulmonary nodules highly suspicious for metastatic disease.   05/21/2014 Imaging PET CT scan showed multiple  pulmonary nodules which are hypermetabolic.   05/27/2014 Procedure [id number] CT-guided biopsy confirms malignant cells, compatible with squamous cell carcinoma.   06/13/2014 -  Chemotherapy She is started on palliative treatment with carboplatin, 5-FU and Erbitux   07/25/2014 Adverse Reaction Cycle 3 chemo is held and delayed due to leukopenia    INTERVAL HISTORY: Please see below for problem oriented charting. She is seen prior to cycle 3 of chemo. She has mild upper respiratory congestion.  REVIEW OF SYSTEMS:   Constitutional: Denies fevers, chills or abnormal weight loss Eyes: Denies blurriness of vision Ears, nose, mouth, throat, and face: Denies mucositis or sore throat Respiratory: Denies cough, dyspnea or wheezes Cardiovascular: Denies palpitation, chest discomfort or lower extremity swelling Gastrointestinal:  Denies nausea, heartburn or change in bowel habits Skin: Denies abnormal skin rashes Lymphatics: Denies new lymphadenopathy or easy bruising Neurological:Denies numbness, tingling or new weaknesses Behavioral/Psych: Mood is stable, no new changes  All other systems were reviewed with the patient and are negative.  I have reviewed the past medical history, past surgical history, social history and family history with the patient and they are unchanged from previous note.  ALLERGIES:  has No Known Allergies.  MEDICATIONS:  Current Outpatient Prescriptions  Medication Sig Dispense Refill  . Alum & Mag Hydroxide-Simeth (MAGIC MOUTHWASH) SOLN Take 5 mLs by mouth 4 (four) times daily as needed for mouth pain.  240 mL  2  . calcium carbonate (OS-CAL) 1250 MG chewable tablet Chew 1 tablet by mouth daily.       . Cholecalciferol (VITAMIN D) 2000 UNITS CAPS Take 2,000 Units by mouth daily.       . clindamycin (CLINDAGEL) 1 % gel Apply topically 2 (  two) times daily.  30 g  0  . fexofenadine-pseudoephedrine (ALLEGRA-D 24) 180-240 MG per 24 hr tablet Take 1 tablet by mouth daily as  needed (for allergies).       . hydrocortisone 1 % ointment Apply 1 application topically 2 (two) times daily.  30 g  0  . lidocaine-prilocaine (EMLA) cream Apply 1 application topically as needed.  30 g  6  . omeprazole (PRILOSEC) 40 MG capsule Take 40 mg by mouth daily as needed (Acid reflux).       . ondansetron (ZOFRAN) 8 MG tablet Take 1 tablet (8 mg total) by mouth every 8 (eight) hours as needed.  60 tablet  1  . OVER THE COUNTER MEDICATION Take 1 tablet by mouth daily. MED NAME: Vitamin B-17      . oxyCODONE (OXY IR/ROXICODONE) 5 MG immediate release tablet Take 1 tablet (5 mg total) by mouth every 4 (four) hours as needed for severe pain.  30 tablet  0  . Polyethyl Glycol-Propyl Glycol (SYSTANE OP) Place 1 drop into both eyes 3 (three) times daily as needed (for allergies).       . pravastatin (PRAVACHOL) 40 MG tablet Take 40 mg by mouth daily.      . prochlorperazine (COMPAZINE) 10 MG tablet Take 1 tablet (10 mg total) by mouth every 6 (six) hours as needed (Nausea or vomiting).  30 tablet  1  . amoxicillin-clavulanate (AUGMENTIN) 875-125 MG per tablet Take 1 tablet by mouth 2 (two) times daily.  14 tablet  0   No current facility-administered medications for this visit.    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 LYMPH:  no palpable lymphadenopathy in the cervical, axillary or inguinal LUNGS: clear to auscultation and percussion with normal breathing effort HEART:  regular rate & rhythm and no murmurs and no lower extremity edema ABDOMEN:abdomen soft, non-tender and normal bowel sounds Musculoskeletal:no cyanosis of digits and no clubbing  NEURO: alert & oriented x 3 with fluent speech, no focal motor/sensory deficits  LABORATORY DATA:  I have reviewed the data as listed    Component Value Date/Time   NA 142 07/25/2014 0909   NA 140 06/07/2012 1241   K 3.9 07/25/2014 0909   K 3.9 06/07/2012 1241   CL 103 04/17/2013 0803   CL 103 06/07/2012 1241   CO2 25 07/25/2014 0909   CO2 28 06/07/2012 1241   GLUCOSE 74 07/25/2014 0909   GLUCOSE 78 04/17/2013 0803   GLUCOSE 91 06/07/2012 1241   BUN 12.8 07/25/2014 0909   BUN 16 06/07/2012 1241   CREATININE 0.8 07/25/2014 0909   CREATININE 1.05 06/07/2012 1241   CALCIUM 9.3 07/25/2014 0909   CALCIUM 9.3 06/07/2012 1241   PROT 7.0 07/25/2014 0909   PROT 6.3 03/17/2012 0824   ALBUMIN 3.8 07/25/2014 0909   ALBUMIN 4.1 03/17/2012 0824   AST 18 07/25/2014 0909   AST 16 03/17/2012 0824   ALT 11 07/25/2014 0909   ALT <8  03/17/2012 0824   ALKPHOS 83 07/25/2014 0909   ALKPHOS 61 03/17/2012 0824   BILITOT 0.25 07/25/2014 0909   BILITOT 0.2* 03/17/2012 0824   GFRNONAA 55* 06/07/2012 1241   GFRAA 64* 06/07/2012 1241    No results found for this basename: SPEP, UPEP,  kappa and lambda light chains    Lab Results  Component Value Date   WBC 1.6* 07/25/2014   NEUTROABS 0.6* 07/25/2014   HGB 11.7 07/25/2014   HCT 36.0 07/25/2014   MCV 96.6 07/25/2014   PLT 143* 07/25/2014      Chemistry      Component Value Date/Time   NA 142 07/25/2014 0909   NA 140 06/07/2012 1241   K 3.9 07/25/2014 0909   K 3.9 06/07/2012 1241   CL 103 04/17/2013 0803   CL 103 06/07/2012 1241   CO2 25 07/25/2014 0909   CO2 28 06/07/2012 1241   BUN 12.8 07/25/2014 0909   BUN 16 06/07/2012 1241   CREATININE 0.8 07/25/2014 0909   CREATININE 1.05 06/07/2012 1241      Component Value Date/Time   CALCIUM 9.3 07/25/2014 0909   CALCIUM 9.3 06/07/2012 1241   ALKPHOS 83 07/25/2014  0909   ALKPHOS 61 03/17/2012 0824   AST 18 07/25/2014 0909   AST 16 03/17/2012 0824   ALT 11 07/25/2014 0909   ALT <8 03/17/2012 0824   BILITOT 0.25 07/25/2014 0909   BILITOT 0.2* 03/17/2012 0824      ASSESSMENT & PLAN:  Cancer of hypopharynx She tolerated treatment well apart from minor side effects. Unfortunately, she has progressive leukopenia. I want to hold treatment today and delay by 1 week. I will resume treatment once her [place] is greater than 1500. I will add Neulasta to future treatment cycles to be give at day 8. I plan to restage her after 3 cycles of chemotherapy. PET scan is ordered    Leukopenia due to antineoplastic chemotherapy This is likely due to recent treatment. The patient denies recent history of fevers, cough, chills, diarrhea or dysuria. She has mild upper respiratory congestion. I will observe for now.  I will hold chemotherapy as above. I emphasized neutropenic precaution and gave her a prescription of antibiotics to hang on to.     No orders of the defined types were placed in this encounter.   All questions were answered. The patient knows to call the clinic with any problems, questions or concerns. No barriers to learning was detected. I spent 30 minutes counseling the patient face to face. The total time spent in the appointment was 40 minutes and more than 50% was on counseling and review of test results     [facility], [place], MD 07/25/2014 7:52 PM

## 2014-07-25 NOTE — Assessment & Plan Note (Signed)
This is likely due to recent treatment. The patient denies recent history of fevers, cough, chills, diarrhea or dysuria. She has mild upper respiratory congestion. I will observe for now.  I will hold chemotherapy as above. I emphasized neutropenic precaution and gave her a prescription of antibiotics to hang on to.

## 2014-07-25 NOTE — Assessment & Plan Note (Signed)
She tolerated treatment well apart from minor side effects. Unfortunately, she has progressive leukopenia. I want to hold treatment today and delay by 1 week. I will resume treatment once her [place] is greater than 1500. I will add Neulasta to future treatment cycles to be give at day 8. I plan to restage her after 3 cycles of chemotherapy. PET scan is ordered

## 2014-07-29 NOTE — Progress Notes (Signed)
To provide support and encouragement, care continuity and to assess for needs, I met with patient and her husband during [place] appt with Dr. Alvy Bimler.  She denied any needs or concerns; I encouraged her to contact me if that changes before I see her next, she verbalized understanding. I recognized her positive attitude as she continues with tmts, particularly with regard to her management of infusion pump which she had earlier expressed misgivings on her ability to manage.  Continuing navigation as L2 patient (treatments established).  Gayleen Orem, RN, BSN, [place] at [place] [phone]

## 2014-08-01 ENCOUNTER — Encounter: Payer: Self-pay | Admitting: *Deleted

## 2014-08-01 ENCOUNTER — Other Ambulatory Visit (HOSPITAL_BASED_OUTPATIENT_CLINIC_OR_DEPARTMENT_OTHER): Payer: Medicare Other

## 2014-08-01 ENCOUNTER — Other Ambulatory Visit: Payer: Self-pay | Admitting: Hematology and Oncology

## 2014-08-01 ENCOUNTER — Ambulatory Visit (HOSPITAL_BASED_OUTPATIENT_CLINIC_OR_DEPARTMENT_OTHER): Payer: Medicare Other

## 2014-08-01 VITALS — BP 122/61 | HR 71 | Temp 96.8°F

## 2014-08-01 DIAGNOSIS — Z5111 Encounter for antineoplastic chemotherapy: Secondary | ICD-10-CM

## 2014-08-01 DIAGNOSIS — C139 Malignant neoplasm of hypopharynx, unspecified: Secondary | ICD-10-CM

## 2014-08-01 DIAGNOSIS — Z5112 Encounter for antineoplastic immunotherapy: Secondary | ICD-10-CM

## 2014-08-01 DIAGNOSIS — R21 Rash and other nonspecific skin eruption: Secondary | ICD-10-CM

## 2014-08-01 LAB — COMPREHENSIVE METABOLIC PANEL (CC13)
ALT: 8 U/L (ref 0–55)
AST: 17 U/L (ref 5–34)
Albumin: 3.8 g/dL (ref 3.5–5.0)
Alkaline Phosphatase: 86 U/L (ref 40–150)
Anion Gap: 11 mEq/L (ref 3–11)
BUN: 12.9 mg/dL (ref 7.0–26.0)
CALCIUM: 9.2 mg/dL (ref 8.4–10.4)
CHLORIDE: 110 meq/L — AB (ref 98–109)
CO2: 21 mEq/L — ABNORMAL LOW (ref 22–29)
CREATININE: 0.8 mg/dL (ref 0.6–1.1)
Glucose: 99 mg/dl (ref 70–140)
Potassium: 4 mEq/L (ref 3.5–5.1)
Sodium: 142 mEq/L (ref 136–145)
Total Bilirubin: 0.34 mg/dL (ref 0.20–1.20)
Total Protein: 6.9 g/dL (ref 6.4–8.3)

## 2014-08-01 LAB — CBC WITH DIFFERENTIAL/PLATELET
BASO%: 1 % (ref 0.0–2.0)
BASOS ABS: 0 10*3/uL (ref 0.0–0.1)
EOS%: 3.8 % (ref 0.0–7.0)
Eosinophils Absolute: 0.1 10*3/uL (ref 0.0–0.5)
HEMATOCRIT: 36.4 % (ref 34.8–46.6)
HEMOGLOBIN: 11.7 g/dL (ref 11.6–15.9)
LYMPH%: 44.1 % (ref 14.0–49.7)
MCH: 31 pg (ref 25.1–34.0)
MCHC: 32.1 g/dL (ref 31.5–36.0)
MCV: 96.6 fL (ref 79.5–101.0)
MONO#: 0.2 10*3/uL (ref 0.1–0.9)
MONO%: 8 % (ref 0.0–14.0)
NEUT#: 1.2 10*3/uL — ABNORMAL LOW (ref 1.5–6.5)
NEUT%: 43.1 % (ref 38.4–76.8)
Platelets: 163 10*3/uL (ref 145–400)
RBC: 3.77 10*6/uL (ref 3.70–5.45)
RDW: 17.1 % — ABNORMAL HIGH (ref 11.2–14.5)
WBC: 2.9 10*3/uL — AB (ref 3.9–10.3)
lymph#: 1.3 10*3/uL (ref 0.9–3.3)

## 2014-08-01 LAB — MAGNESIUM (CC13): MAGNESIUM: 2.2 mg/dL (ref 1.5–2.5)

## 2014-08-01 MED ORDER — CETUXIMAB CHEMO IV INJECTION 200 MG/100ML
250.0000 mg/m2 | Freq: Once | INTRAVENOUS | Status: AC
  Administered 2014-08-01: 400 mg via INTRAVENOUS
  Filled 2014-08-01: qty 200

## 2014-08-01 MED ORDER — SODIUM CHLORIDE 0.9 % IV SOLN
Freq: Once | INTRAVENOUS | Status: AC
  Administered 2014-08-01: 09:00:00 via INTRAVENOUS

## 2014-08-01 MED ORDER — DIPHENHYDRAMINE HCL 50 MG/ML IJ SOLN
INTRAMUSCULAR | Status: AC
  Filled 2014-08-01: qty 1

## 2014-08-01 MED ORDER — SODIUM CHLORIDE 0.9 % IV SOLN
388.5000 mg | Freq: Once | INTRAVENOUS | Status: AC
  Administered 2014-08-01: 390 mg via INTRAVENOUS
  Filled 2014-08-01: qty 39

## 2014-08-01 MED ORDER — DEXAMETHASONE SODIUM PHOSPHATE 20 MG/5ML IJ SOLN
INTRAMUSCULAR | Status: AC
  Filled 2014-08-01: qty 5

## 2014-08-01 MED ORDER — ONDANSETRON 16 MG/50ML IVPB (CHCC)
16.0000 mg | Freq: Once | INTRAVENOUS | Status: AC
  Administered 2014-08-01: 16 mg via INTRAVENOUS

## 2014-08-01 MED ORDER — SODIUM CHLORIDE 0.9 % IV SOLN
800.0000 mg/m2/d | INTRAVENOUS | Status: DC
  Administered 2014-08-01: 4800 mg via INTRAVENOUS
  Filled 2014-08-01: qty 96

## 2014-08-01 MED ORDER — ONDANSETRON 16 MG/50ML IVPB (CHCC)
INTRAVENOUS | Status: AC
  Filled 2014-08-01: qty 16

## 2014-08-01 MED ORDER — DIPHENHYDRAMINE HCL 50 MG/ML IJ SOLN
50.0000 mg | Freq: Once | INTRAMUSCULAR | Status: AC
  Administered 2014-08-01: 50 mg via INTRAVENOUS

## 2014-08-01 MED ORDER — DEXAMETHASONE SODIUM PHOSPHATE 20 MG/5ML IJ SOLN
20.0000 mg | Freq: Once | INTRAMUSCULAR | Status: AC
  Administered 2014-08-01: 20 mg via INTRAVENOUS

## 2014-08-01 MED ORDER — HEPARIN SOD (PORK) LOCK FLUSH 100 UNIT/ML IV SOLN
500.0000 [IU] | Freq: Once | INTRAVENOUS | Status: DC | PRN
  Filled 2014-08-01: qty 5

## 2014-08-01 MED ORDER — SODIUM CHLORIDE 0.9 % IJ SOLN
10.0000 mL | INTRAMUSCULAR | Status: DC | PRN
  Filled 2014-08-01: qty 10

## 2014-08-01 NOTE — Patient Instructions (Addendum)
[facility] Discharge Instructions for Patients Receiving Chemotherapy  Today you received the following chemotherapy agents Erbitux/Carboplatin/5 FU To help prevent nausea and vomiting after your treatment, we encourage you to take your nausea medication as prescribed.If you develop nausea and vomiting that is not controlled by your nausea medication, call the clinic.   BELOW ARE SYMPTOMS THAT SHOULD BE REPORTED IMMEDIATELY:  *FEVER GREATER THAN 100.5 F  *CHILLS WITH OR WITHOUT FEVER  NAUSEA AND VOMITING THAT IS NOT CONTROLLED WITH YOUR NAUSEA MEDICATION  *UNUSUAL SHORTNESS OF BREATH  *UNUSUAL BRUISING OR BLEEDING  TENDERNESS IN MOUTH AND THROAT WITH OR WITHOUT PRESENCE OF ULCERS  *URINARY PROBLEMS  *BOWEL PROBLEMS  UNUSUAL RASH Items with * indicate a potential emergency and should be followed up as soon as possible.  Feel free to call the clinic you have any questions or concerns. The clinic phone number is [phone].    Pegfilgrastim injection (Neulasta) What is this medicine? PEGFILGRASTIM (peg fil GRA stim) is a long-acting granulocyte colony-stimulating factor that stimulates the growth of neutrophils, a type of white blood cell important in the body's fight against infection. It is used to reduce the incidence of fever and infection in patients with certain types of cancer who are receiving chemotherapy that affects the bone marrow. This medicine may be used for other purposes; ask your health care provider or pharmacist if you have questions. COMMON BRAND NAME(S): Neulasta What should I tell my health care provider before I take this medicine? They need to know if you have any of these conditions: -latex allergy -ongoing radiation therapy -sickle cell disease -skin reactions to acrylic adhesives (On-Body Injector only) -an unusual or allergic reaction to pegfilgrastim, filgrastim, other medicines, foods, dyes, or preservatives -pregnant or trying  to get pregnant -breast-feeding How should I use this medicine? This medicine is for injection under the skin. If you get this medicine at home, you will be taught how to prepare and give the pre-filled syringe or how to use the On-body Injector. Refer to the patient Instructions for Use for detailed instructions. Use exactly as directed. Take your medicine at regular intervals. Do not take your medicine more often than directed. It is important that you put your used needles and syringes in a special sharps container. Do not put them in a trash can. If you do not have a sharps container, call your pharmacist or healthcare provider to get one. Talk to your pediatrician regarding the use of this medicine in children. Special care may be needed. Overdosage: If you think you have taken too much of this medicine contact a poison control center or emergency room at once. NOTE: This medicine is only for you. Do not share this medicine with others. What if I miss a dose? It is important not to miss your dose. Call your doctor or health care professional if you miss your dose. If you miss a dose due to an On-body Injector failure or leakage, a new dose should be administered as soon as possible using a single prefilled syringe for manual use. What may interact with this medicine? Interactions have not been studied. Give your health care provider a list of all the medicines, herbs, non-prescription drugs, or dietary supplements you use. Also tell them if you smoke, drink alcohol, or use illegal drugs. Some items may interact with your medicine. This list may not describe all possible interactions. Give your health care provider a list of all the medicines, herbs, non-prescription drugs, or  dietary supplements you use. Also tell them if you smoke, drink alcohol, or use illegal drugs. Some items may interact with your medicine. What should I watch for while using this medicine? You may need blood work done while  you are taking this medicine. If you are going to need a MRI, CT scan, or other procedure, tell your doctor that you are using this medicine (On-Body Injector only). What side effects may I notice from receiving this medicine? Side effects that you should report to your doctor or health care professional as soon as possible: -allergic reactions like skin rash, itching or hives, swelling of the face, lips, or tongue -dizziness -fever -pain, redness, or irritation at site where injected -pinpoint red spots on the skin -shortness of breath or breathing problems -stomach or side pain, or pain at the shoulder -swelling -tiredness -trouble passing urine Side effects that usually do not require medical attention (report to your doctor or health care professional if they continue or are bothersome): -bone pain -muscle pain This list may not describe all possible side effects. Call your doctor for medical advice about side effects. You may report side effects to FDA at 1-800-FDA-1088. Where should I keep my medicine? Keep out of the reach of children. Store pre-filled syringes in a refrigerator between 2 and 8 degrees C (36 and 46 degrees F). Do not freeze. Keep in carton to protect from light. Throw away this medicine if it is left out of the refrigerator for more than 48 hours. Throw away any unused medicine after the expiration date. NOTE: This sheet is a summary. It may not cover all possible information. If you have questions about this medicine, talk to your doctor, pharmacist, or health care provider.  2015, [employer]. (2014-01-31 16:14:05)

## 2014-08-01 NOTE — Progress Notes (Signed)
Patient observed for 1 hour post Erbitux infusion.

## 2014-08-02 ENCOUNTER — Encounter: Payer: Self-pay | Admitting: *Deleted

## 2014-08-02 NOTE — Progress Notes (Signed)
Wrong entry day.

## 2014-08-02 NOTE — Progress Notes (Deleted)
To provide support and encouragement, met with patient during chemo infusion.  Subjectively, she was upbeat and expressed positive attitude about her treatments.  She denied any needs or concerns; I encouraged her to contact me if that changes before I see her next, she verbalized agreement.  Continuing navigation as L2 patient (treatments established).  Gayleen Orem, RN, BSN, [place] at [place] [phone]

## 2014-08-02 NOTE — Progress Notes (Signed)
To provide support and encouragement, met with patient during chemo infusion.  Subjectively, she was upbeat and expressed positive attitude about her treatments.  She denied any needs or concerns; I encouraged her to contact me if that changes before I see her next, she verbalized agreement.  Continuing navigation as L2 patient (treatments established).  Rick Prudy Candy, RN, BSN, CHPN Head & Neck Oncology Navigator [facility] at [place] [phone]  

## 2014-08-05 ENCOUNTER — Other Ambulatory Visit: Payer: Self-pay

## 2014-08-05 ENCOUNTER — Ambulatory Visit: Payer: Self-pay

## 2014-08-05 ENCOUNTER — Ambulatory Visit (HOSPITAL_BASED_OUTPATIENT_CLINIC_OR_DEPARTMENT_OTHER): Payer: Medicare Other

## 2014-08-05 VITALS — BP 119/67 | HR 87 | Temp 97.6°F

## 2014-08-05 DIAGNOSIS — C139 Malignant neoplasm of hypopharynx, unspecified: Secondary | ICD-10-CM

## 2014-08-05 MED ORDER — HEPARIN SOD (PORK) LOCK FLUSH 100 UNIT/ML IV SOLN
500.0000 [IU] | Freq: Once | INTRAVENOUS | Status: AC | PRN
  Administered 2014-08-05: 500 [IU]
  Filled 2014-08-05: qty 5

## 2014-08-05 MED ORDER — SODIUM CHLORIDE 0.9 % IJ SOLN
10.0000 mL | INTRAMUSCULAR | Status: DC | PRN
  Administered 2014-08-05: 10 mL
  Filled 2014-08-05: qty 10

## 2014-08-08 ENCOUNTER — Ambulatory Visit (HOSPITAL_BASED_OUTPATIENT_CLINIC_OR_DEPARTMENT_OTHER): Payer: Medicare Other

## 2014-08-08 ENCOUNTER — Other Ambulatory Visit (HOSPITAL_BASED_OUTPATIENT_CLINIC_OR_DEPARTMENT_OTHER): Payer: Medicare Other

## 2014-08-08 ENCOUNTER — Ambulatory Visit: Payer: Self-pay

## 2014-08-08 VITALS — BP 136/62 | HR 67 | Temp 97.5°F | Resp 18

## 2014-08-08 DIAGNOSIS — Z5112 Encounter for antineoplastic immunotherapy: Secondary | ICD-10-CM

## 2014-08-08 DIAGNOSIS — R21 Rash and other nonspecific skin eruption: Secondary | ICD-10-CM

## 2014-08-08 DIAGNOSIS — Z5189 Encounter for other specified aftercare: Secondary | ICD-10-CM

## 2014-08-08 DIAGNOSIS — C139 Malignant neoplasm of hypopharynx, unspecified: Secondary | ICD-10-CM

## 2014-08-08 LAB — COMPREHENSIVE METABOLIC PANEL (CC13)
ALBUMIN: 3.7 g/dL (ref 3.5–5.0)
ALT: 10 U/L (ref 0–55)
ANION GAP: 8 meq/L (ref 3–11)
AST: 18 U/L (ref 5–34)
Alkaline Phosphatase: 73 U/L (ref 40–150)
BUN: 11 mg/dL (ref 7.0–26.0)
CALCIUM: 9.4 mg/dL (ref 8.4–10.4)
CHLORIDE: 107 meq/L (ref 98–109)
CO2: 27 meq/L (ref 22–29)
Creatinine: 0.8 mg/dL (ref 0.6–1.1)
GLUCOSE: 98 mg/dL (ref 70–140)
POTASSIUM: 3.9 meq/L (ref 3.5–5.1)
SODIUM: 141 meq/L (ref 136–145)
TOTAL PROTEIN: 6.9 g/dL (ref 6.4–8.3)
Total Bilirubin: 0.41 mg/dL (ref 0.20–1.20)

## 2014-08-08 LAB — CBC WITH DIFFERENTIAL/PLATELET
BASO%: 1 % (ref 0.0–2.0)
Basophils Absolute: 0 10*3/uL (ref 0.0–0.1)
EOS ABS: 0.1 10*3/uL (ref 0.0–0.5)
EOS%: 5.1 % (ref 0.0–7.0)
HCT: 34.6 % — ABNORMAL LOW (ref 34.8–46.6)
HGB: 11.2 g/dL — ABNORMAL LOW (ref 11.6–15.9)
LYMPH#: 0.8 10*3/uL — AB (ref 0.9–3.3)
LYMPH%: 42.3 % (ref 14.0–49.7)
MCH: 31.4 pg (ref 25.1–34.0)
MCHC: 32.4 g/dL (ref 31.5–36.0)
MCV: 96.8 fL (ref 79.5–101.0)
MONO#: 0.2 10*3/uL (ref 0.1–0.9)
MONO%: 8.3 % (ref 0.0–14.0)
NEUT%: 43.3 % (ref 38.4–76.8)
NEUTROS ABS: 0.8 10*3/uL — AB (ref 1.5–6.5)
PLATELETS: 240 10*3/uL (ref 145–400)
RBC: 3.57 10*6/uL — AB (ref 3.70–5.45)
RDW: 17.8 % — ABNORMAL HIGH (ref 11.2–14.5)
WBC: 1.9 10*3/uL — AB (ref 3.9–10.3)

## 2014-08-08 LAB — MAGNESIUM (CC13): MAGNESIUM: 2.1 mg/dL (ref 1.5–2.5)

## 2014-08-08 MED ORDER — HEPARIN SOD (PORK) LOCK FLUSH 100 UNIT/ML IV SOLN
500.0000 [IU] | Freq: Once | INTRAVENOUS | Status: AC | PRN
  Administered 2014-08-08: 500 [IU]
  Filled 2014-08-08: qty 5

## 2014-08-08 MED ORDER — SODIUM CHLORIDE 0.9 % IV SOLN
Freq: Once | INTRAVENOUS | Status: AC
  Administered 2014-08-08: 10:00:00 via INTRAVENOUS

## 2014-08-08 MED ORDER — DIPHENHYDRAMINE HCL 50 MG/ML IJ SOLN
INTRAMUSCULAR | Status: AC
  Filled 2014-08-08: qty 1

## 2014-08-08 MED ORDER — SODIUM CHLORIDE 0.9 % IJ SOLN
10.0000 mL | INTRAMUSCULAR | Status: DC | PRN
  Administered 2014-08-08: 10 mL
  Filled 2014-08-08: qty 10

## 2014-08-08 MED ORDER — CETUXIMAB CHEMO IV INJECTION 200 MG/100ML
250.0000 mg/m2 | Freq: Once | INTRAVENOUS | Status: AC
  Administered 2014-08-08: 400 mg via INTRAVENOUS
  Filled 2014-08-08: qty 200

## 2014-08-08 MED ORDER — DIPHENHYDRAMINE HCL 50 MG/ML IJ SOLN
50.0000 mg | Freq: Once | INTRAMUSCULAR | Status: AC
  Administered 2014-08-08: 50 mg via INTRAVENOUS

## 2014-08-08 MED ORDER — PEGFILGRASTIM INJECTION 6 MG/0.6ML
6.0000 mg | Freq: Once | SUBCUTANEOUS | Status: AC
  Administered 2014-08-08: 6 mg via SUBCUTANEOUS
  Filled 2014-08-08: qty 0.6

## 2014-08-08 NOTE — Patient Instructions (Signed)
Cetuximab injection What is this medicine? CETUXIMAB (se TUX i mab) is a chemotherapy drug. It targets a specific protein within cancer cells and stops the cells from growing. It is used to treat colorectal cancer and head and neck cancer. This medicine may be used for other purposes; ask your health care provider or pharmacist if you have questions. COMMON BRAND NAME(S): Erbitux What should I tell my health care provider before I take this medicine? They need to know if you have any of these conditions: -heart disease -history of irregular heartbeat -history of low levels of calcium, magnesium, or potassium in the blood -lung or breathing disease, like asthma -an unusual or allergic reaction to cetuximab, other medicines, foods, dyes, or preservatives -pregnant or trying to get pregnant -breast-feeding How should I use this medicine? This drug is given as an infusion into a vein. It is administered in a hospital or clinic by a specially trained health care professional. Talk to your pediatrician regarding the use of this medicine in children. Special care may be needed. Overdosage: If you think you have taken too much of this medicine contact a poison control center or emergency room at once. NOTE: This medicine is only for you. Do not share this medicine with others. What if I miss a dose? It is important not to miss your dose. Call your doctor or health care professional if you are unable to keep an appointment. What may interact with this medicine? Interactions are not expected. This list may not describe all possible interactions. Give your health care provider a list of all the medicines, herbs, non-prescription drugs, or dietary supplements you use. Also tell them if you smoke, drink alcohol, or use illegal drugs. Some items may interact with your medicine. What should I watch for while using this medicine? Visit your doctor or health care professional for regular checks on your  progress. This drug may make you feel generally unwell. This is not uncommon, as chemotherapy can affect healthy cells as well as cancer cells. Report any side effects. Continue your course of treatment even though you feel ill unless your doctor tells you to stop. This medicine can make you more sensitive to the sun. Keep out of the sun while taking this medicine and for 2 months after the last dose. If you cannot avoid being in the sun, wear protective clothing and use sunscreen. Do not use sun lamps or tanning beds/booths. You may need blood work done while you are taking this medicine. In some cases, you may be given additional medicines to help with side effects. Follow all directions for their use. Call your doctor or health care professional for advice if you get a fever, chills or sore throat, or other symptoms of a cold or flu. Do not treat yourself. This drug decreases your body's ability to fight infections. Try to avoid being around people who are sick. Avoid taking products that contain aspirin, acetaminophen, ibuprofen, naproxen, or ketoprofen unless instructed by your doctor. These medicines may hide a fever. Do not become pregnant while taking this medicine. Women should inform their doctor if they wish to become pregnant or think they might be pregnant. There is a potential for serious side effects to an unborn child. Use adequate birth control methods. Avoid pregnancy for at least 6 months after your last dose. Talk to your health care professional or pharmacist for more information. Do not breast-feed an infant while taking this medicine or during the 2 months after your last  dose. What side effects may I notice from receiving this medicine? Side effects that you should report to your doctor or health care professional as soon as possible: -allergic reactions like skin rash, itching or hives, swelling of the face, lips, or tongue -breathing problems -changes in vision -fast, irregular  heartbeat -feeling faint or lightheaded, falls -fever, chills -mouth sores -redness, blistering, peeling or loosening of the skin, including inside the mouth -trouble passing urine or change in the amount of urine -unusually weak or tired Side effects that usually do not require medical attention (report to your doctor or health care professional if they continue or are bothersome): -changes in skin like acne, cracks, skin dryness -constipation -diarrhea -headache -nail changes -nausea, vomiting -stomach upset -weight loss This list may not describe all possible side effects. Call your doctor for medical advice about side effects. You may report side effects to FDA at 1-800-FDA-1088. Where should I keep my medicine? This drug is given in a hospital or clinic and will not be stored at home. NOTE: This sheet is a summary. It may not cover all possible information. If you have questions about this medicine, talk to your doctor, pharmacist, or health care provider.  2015, Elsevier/Gold Standard. (2014-02-13 16:14:34)    Neutropenia Neutropenia is a condition that occurs when the level of a certain type of white blood cell (neutrophil) in your body becomes lower than normal. Neutrophils are made in the bone marrow and fight infections. These cells protect against bacteria and viruses. The fewer neutrophils you have, and the longer your body remains without them, the greater your risk of getting a severe infection becomes. CAUSES  The cause of neutropenia may be hard to determine. However, it is usually due to 3 main problems:   Decreased production of neutrophils. This may be due to:  Certain medicines such as chemotherapy.  Genetic problems.  Cancer.  Radiation treatments.  Vitamin deficiency.  Some pesticides.  Increased destruction of neutrophils. This may be due to:  Overwhelming infections.  Hemolytic anemia. This is when the body destroys its own blood  cells.  Chemotherapy.  Neutrophils moving to areas of the body where they cannot fight infections. This may be due to:  Dialysis procedures.  Conditions where the spleen becomes enlarged. Neutrophils are held in the spleen and are not available to the rest of the body.  Overwhelming infections. The neutrophils are held in the area of the infection and are not available to the rest of the body. SYMPTOMS  There are no specific symptoms of neutropenia. The lack of neutrophils can result in an infection, and an infection can cause various problems. DIAGNOSIS  Diagnosis is made by a blood test. A complete blood count is performed. The normal level of neutrophils in human blood differs with age and race. Infants have lower counts than older children and adults. African Americans have lower counts than Caucasians or Asians. The average adult level is 1500 cells/mm3 of blood. Neutrophil counts are interpreted as follows:  Greater than 1000 cells/mm3 gives normal protection against infection.  500 to 1000 cells/mm3 gives an increased risk for infection.  200 to 500 cells/mm3 is a greater risk for severe infection.  Lower than 200 cells/mm3 is a marked risk of infection. This may require hospitalization and treatment with antibiotic medicines. TREATMENT  Treatment depends on the underlying cause, severity, and presence of infections or symptoms. It also depends on your health. Your caregiver will discuss the treatment plan with you. Mild cases  are often easily treated and have a good outcome. Preventative measures may also be started to limit your risk of infections. Treatment can include:  Taking antibiotics.  Stopping medicines that are known to cause neutropenia.  Correcting nutritional deficiencies by eating green vegetables to supply folic acid and taking vitamin B supplements.  Stopping exposure to pesticides if your neutropenia is related to pesticide exposure.  Taking a blood growth  factor called sargramostim, pegfilgrastim, or filgrastim if you are undergoing chemotherapy for cancer. This stimulates white blood cell production.  Removal of the spleen if you have Felty's syndrome and have repeated infections. HOME CARE INSTRUCTIONS   Follow your caregiver's instructions about when you need to have blood work done.  Wash your hands often. Make sure others who come in contact with you also wash their hands.  Wash raw fruits and vegetables before eating them. They can carry bacteria and fungi.  Avoid people with colds or spreadable (contagious) diseases (chickenpox, herpes zoster, influenza).  Avoid large crowds.  Avoid construction areas. The dust can release fungus into the air.  Be cautious around children in daycare or school environments.  Take care of your respiratory system by coughing and deep breathing.  Bathe daily.  Protect your skin from cuts and burns.  Do not work in the garden or with flowers and plants.  Care for the mouth before and after meals by brushing with a soft toothbrush. If you have mucositis, do not use mouthwash. Mouthwash contains alcohol and can dry out the mouth even more.  Clean the area between the genitals and the anus (perineal area) after urination and bowel movements. Women need to wipe from front to back.  Use a water soluble lubricant during sexual intercourse and practice good hygiene after. Do not have intercourse if you are severely neutropenic. Check with your caregiver for guidelines.  Exercise daily as tolerated.  Avoid people who were vaccinated with a live vaccine in the past 30 days. You should not receive live vaccines (polio, typhoid).  Do not provide direct care for pets. Avoid animal droppings. Do not clean litter boxes and bird cages.  Do not share food utensils.  Do not use tampons, enemas, or rectal suppositories unless directed by your caregiver.  Use an electric razor to remove hair.  Wash your  hands after handling magazines, letters, and newspapers. SEEK IMMEDIATE MEDICAL CARE IF:   You have a fever.  You have chills or start to shake.  You feel nauseous or vomit.  You develop mouth sores.  You develop aches and pains.  You have redness and swelling around open wounds.  Your skin is warm to the touch.  You have pus coming from your wounds.  You develop swollen lymph nodes.  You feel weak or fatigued.  You develop red streaks on the skin. MAKE SURE YOU:  Understand these instructions.  Will watch your condition.  Will get help right away if you are not doing well or get worse. Document Released: 04/23/2002 Document Revised: 01/24/2012 Document Reviewed: 05/21/2011 [facility] Patient Information 2015 [place], [place]. This information is not intended to replace advice given to you by your health care provider. Make sure you discuss any questions you have with your health care provider.

## 2014-08-15 ENCOUNTER — Other Ambulatory Visit: Payer: Self-pay | Admitting: Hematology and Oncology

## 2014-08-15 ENCOUNTER — Other Ambulatory Visit (HOSPITAL_BASED_OUTPATIENT_CLINIC_OR_DEPARTMENT_OTHER): Payer: Medicare Other

## 2014-08-15 ENCOUNTER — Ambulatory Visit (HOSPITAL_BASED_OUTPATIENT_CLINIC_OR_DEPARTMENT_OTHER): Payer: Medicare Other

## 2014-08-15 VITALS — BP 144/74 | HR 74 | Temp 97.9°F | Resp 18

## 2014-08-15 DIAGNOSIS — C139 Malignant neoplasm of hypopharynx, unspecified: Secondary | ICD-10-CM

## 2014-08-15 DIAGNOSIS — Z5112 Encounter for antineoplastic immunotherapy: Secondary | ICD-10-CM

## 2014-08-15 DIAGNOSIS — R21 Rash and other nonspecific skin eruption: Secondary | ICD-10-CM

## 2014-08-15 DIAGNOSIS — Z23 Encounter for immunization: Secondary | ICD-10-CM

## 2014-08-15 LAB — COMPREHENSIVE METABOLIC PANEL (CC13)
ALT: 12 U/L (ref 0–55)
ANION GAP: 7 meq/L (ref 3–11)
AST: 20 U/L (ref 5–34)
Albumin: 3.7 g/dL (ref 3.5–5.0)
Alkaline Phosphatase: 140 U/L (ref 40–150)
BUN: 6.7 mg/dL — ABNORMAL LOW (ref 7.0–26.0)
CHLORIDE: 108 meq/L (ref 98–109)
CO2: 26 meq/L (ref 22–29)
Calcium: 9.5 mg/dL (ref 8.4–10.4)
Creatinine: 0.8 mg/dL (ref 0.6–1.1)
Glucose: 94 mg/dl (ref 70–140)
Potassium: 3.9 mEq/L (ref 3.5–5.1)
Sodium: 140 mEq/L (ref 136–145)
TOTAL PROTEIN: 6.8 g/dL (ref 6.4–8.3)
Total Bilirubin: 0.29 mg/dL (ref 0.20–1.20)

## 2014-08-15 LAB — CBC WITH DIFFERENTIAL/PLATELET
BASO%: 0.5 % (ref 0.0–2.0)
Basophils Absolute: 0.1 10*3/uL (ref 0.0–0.1)
EOS ABS: 0.1 10*3/uL (ref 0.0–0.5)
EOS%: 0.7 % (ref 0.0–7.0)
HCT: 34.3 % — ABNORMAL LOW (ref 34.8–46.6)
HGB: 11.3 g/dL — ABNORMAL LOW (ref 11.6–15.9)
LYMPH%: 11.3 % — ABNORMAL LOW (ref 14.0–49.7)
MCH: 31.9 pg (ref 25.1–34.0)
MCHC: 32.9 g/dL (ref 31.5–36.0)
MCV: 96.9 fL (ref 79.5–101.0)
MONO#: 3.2 10*3/uL — AB (ref 0.1–0.9)
MONO%: 17.3 % — ABNORMAL HIGH (ref 0.0–14.0)
NEUT%: 70.2 % (ref 38.4–76.8)
NEUTROS ABS: 12.8 10*3/uL — AB (ref 1.5–6.5)
NRBC: 1 % — AB (ref 0–0)
Platelets: 103 10*3/uL — ABNORMAL LOW (ref 145–400)
RBC: 3.54 10*6/uL — AB (ref 3.70–5.45)
RDW: 18.7 % — ABNORMAL HIGH (ref 11.2–14.5)
WBC: 18.2 10*3/uL — AB (ref 3.9–10.3)
lymph#: 2.1 10*3/uL (ref 0.9–3.3)

## 2014-08-15 LAB — MAGNESIUM (CC13): Magnesium: 2.3 mg/dl (ref 1.5–2.5)

## 2014-08-15 MED ORDER — SODIUM CHLORIDE 0.9 % IV SOLN
Freq: Once | INTRAVENOUS | Status: AC
  Administered 2014-08-15: 10:00:00 via INTRAVENOUS

## 2014-08-15 MED ORDER — DIPHENHYDRAMINE HCL 50 MG/ML IJ SOLN
INTRAMUSCULAR | Status: AC
  Filled 2014-08-15: qty 1

## 2014-08-15 MED ORDER — HEPARIN SOD (PORK) LOCK FLUSH 100 UNIT/ML IV SOLN
500.0000 [IU] | Freq: Once | INTRAVENOUS | Status: AC | PRN
  Administered 2014-08-15: 500 [IU]
  Filled 2014-08-15: qty 5

## 2014-08-15 MED ORDER — INFLUENZA VAC SPLIT QUAD 0.5 ML IM SUSY
0.5000 mL | PREFILLED_SYRINGE | Freq: Once | INTRAMUSCULAR | Status: AC
  Administered 2014-08-15: 0.5 mL via INTRAMUSCULAR
  Filled 2014-08-15: qty 0.5

## 2014-08-15 MED ORDER — CETUXIMAB CHEMO IV INJECTION 200 MG/100ML
250.0000 mg/m2 | Freq: Once | INTRAVENOUS | Status: AC
  Administered 2014-08-15: 400 mg via INTRAVENOUS
  Filled 2014-08-15: qty 200

## 2014-08-15 MED ORDER — SODIUM CHLORIDE 0.9 % IJ SOLN
10.0000 mL | INTRAMUSCULAR | Status: DC | PRN
  Administered 2014-08-15: 10 mL
  Filled 2014-08-15: qty 10

## 2014-08-15 MED ORDER — DIPHENHYDRAMINE HCL 50 MG/ML IJ SOLN
50.0000 mg | Freq: Once | INTRAMUSCULAR | Status: AC
  Administered 2014-08-15: 50 mg via INTRAVENOUS

## 2014-08-15 NOTE — Patient Instructions (Signed)
[facility] Discharge Instructions for Patients Receiving Chemotherapy  Today you received the following chemotherapy agents Erbitux.  To help prevent nausea and vomiting after your treatment, we encourage you to take your nausea medication.   If you develop nausea and vomiting that is not controlled by your nausea medication, call the clinic.   BELOW ARE SYMPTOMS THAT SHOULD BE REPORTED IMMEDIATELY:  *FEVER GREATER THAN 100.5 F  *CHILLS WITH OR WITHOUT FEVER  NAUSEA AND VOMITING THAT IS NOT CONTROLLED WITH YOUR NAUSEA MEDICATION  *UNUSUAL SHORTNESS OF BREATH  *UNUSUAL BRUISING OR BLEEDING  TENDERNESS IN MOUTH AND THROAT WITH OR WITHOUT PRESENCE OF ULCERS  *URINARY PROBLEMS  *BOWEL PROBLEMS  UNUSUAL RASH Items with * indicate a potential emergency and should be followed up as soon as possible.  Feel free to call the clinic you have any questions or concerns. The clinic phone number is [phone].

## 2014-08-20 ENCOUNTER — Ambulatory Visit (HOSPITAL_COMMUNITY)
Admission: RE | Admit: 2014-08-20 | Discharge: 2014-08-20 | Disposition: A | Discharge status: Home / Self Care | Payer: Medicare Other | Source: Ambulatory Visit | Attending: Hematology and Oncology | Admitting: Hematology and Oncology

## 2014-08-20 DIAGNOSIS — D701 Agranulocytosis secondary to cancer chemotherapy: Secondary | ICD-10-CM

## 2014-08-20 DIAGNOSIS — C760 Malignant neoplasm of head, face and neck: Secondary | ICD-10-CM | POA: Insufficient documentation

## 2014-08-20 DIAGNOSIS — T451X5A Adverse effect of antineoplastic and immunosuppressive drugs, initial encounter: Secondary | ICD-10-CM

## 2014-08-20 DIAGNOSIS — C139 Malignant neoplasm of hypopharynx, unspecified: Secondary | ICD-10-CM

## 2014-08-20 LAB — GLUCOSE, CAPILLARY: Glucose-Capillary: 83 mg/dL (ref 70–99)

## 2014-08-20 MED ORDER — FLUDEOXYGLUCOSE F - 18 (FDG) INJECTION
6.3000 | Freq: Once | INTRAVENOUS | Status: AC | PRN
  Administered 2014-08-20: 6.3 via INTRAVENOUS

## 2014-08-22 ENCOUNTER — Ambulatory Visit (HOSPITAL_BASED_OUTPATIENT_CLINIC_OR_DEPARTMENT_OTHER): Payer: Medicare Other | Admitting: Hematology and Oncology

## 2014-08-22 ENCOUNTER — Telehealth: Payer: Self-pay | Admitting: Hematology and Oncology

## 2014-08-22 ENCOUNTER — Other Ambulatory Visit (HOSPITAL_BASED_OUTPATIENT_CLINIC_OR_DEPARTMENT_OTHER): Payer: Medicare Other

## 2014-08-22 ENCOUNTER — Telehealth: Payer: Self-pay | Admitting: *Deleted

## 2014-08-22 ENCOUNTER — Encounter: Payer: Self-pay | Admitting: Hematology and Oncology

## 2014-08-22 VITALS — BP 124/53 | HR 70 | Temp 98.0°F | Resp 19 | Ht 61.0 in | Wt 129.4 lb

## 2014-08-22 DIAGNOSIS — C139 Malignant neoplasm of hypopharynx, unspecified: Secondary | ICD-10-CM

## 2014-08-22 DIAGNOSIS — D6959 Other secondary thrombocytopenia: Secondary | ICD-10-CM

## 2014-08-22 DIAGNOSIS — R21 Rash and other nonspecific skin eruption: Secondary | ICD-10-CM

## 2014-08-22 DIAGNOSIS — D72819 Decreased white blood cell count, unspecified: Secondary | ICD-10-CM

## 2014-08-22 DIAGNOSIS — T50905A Adverse effect of unspecified drugs, medicaments and biological substances, initial encounter: Secondary | ICD-10-CM

## 2014-08-22 DIAGNOSIS — D630 Anemia in neoplastic disease: Secondary | ICD-10-CM

## 2014-08-22 DIAGNOSIS — D701 Agranulocytosis secondary to cancer chemotherapy: Secondary | ICD-10-CM

## 2014-08-22 DIAGNOSIS — T451X5A Adverse effect of antineoplastic and immunosuppressive drugs, initial encounter: Secondary | ICD-10-CM

## 2014-08-22 LAB — COMPREHENSIVE METABOLIC PANEL (CC13)
ALT: 15 U/L (ref 0–55)
ANION GAP: 7 meq/L (ref 3–11)
AST: 13 U/L (ref 5–34)
Albumin: 3.6 g/dL (ref 3.5–5.0)
Alkaline Phosphatase: 112 U/L (ref 40–150)
BUN: 11.7 mg/dL (ref 7.0–26.0)
CALCIUM: 9.4 mg/dL (ref 8.4–10.4)
CHLORIDE: 107 meq/L (ref 98–109)
CO2: 27 meq/L (ref 22–29)
Creatinine: 0.8 mg/dL (ref 0.6–1.1)
Glucose: 110 mg/dl (ref 70–140)
Potassium: 4.2 mEq/L (ref 3.5–5.1)
SODIUM: 141 meq/L (ref 136–145)
TOTAL PROTEIN: 6.9 g/dL (ref 6.4–8.3)
Total Bilirubin: 0.28 mg/dL (ref 0.20–1.20)

## 2014-08-22 LAB — CBC WITH DIFFERENTIAL/PLATELET
BASO%: 0.5 % (ref 0.0–2.0)
Basophils Absolute: 0 10*3/uL (ref 0.0–0.1)
EOS ABS: 0.1 10*3/uL (ref 0.0–0.5)
EOS%: 2.7 % (ref 0.0–7.0)
HCT: 34.2 % — ABNORMAL LOW (ref 34.8–46.6)
HGB: 11 g/dL — ABNORMAL LOW (ref 11.6–15.9)
LYMPH%: 30.8 % (ref 14.0–49.7)
MCH: 31.6 pg (ref 25.1–34.0)
MCHC: 32.2 g/dL (ref 31.5–36.0)
MCV: 98.3 fL (ref 79.5–101.0)
MONO#: 0.2 10*3/uL (ref 0.1–0.9)
MONO%: 6.1 % (ref 0.0–14.0)
NEUT%: 59.9 % (ref 38.4–76.8)
NEUTROS ABS: 2.3 10*3/uL (ref 1.5–6.5)
Platelets: 114 10*3/uL — ABNORMAL LOW (ref 145–400)
RBC: 3.48 10*6/uL — AB (ref 3.70–5.45)
RDW: 18.5 % — ABNORMAL HIGH (ref 11.2–14.5)
WBC: 3.8 10*3/uL — AB (ref 3.9–10.3)
lymph#: 1.2 10*3/uL (ref 0.9–3.3)

## 2014-08-22 NOTE — Assessment & Plan Note (Signed)
This is likely due to recent treatment. The patient denies recent history of bleeding such as epistaxis, hematuria or hematochezia. She is asymptomatic from the low platelet count. I will observe for now.  she does not require transfusion now. I will continue the chemotherapy at current dose without dosage adjustment.  If the thrombocytopenia gets progressive worse in the future, I might have to delay her treatment or adjust the chemotherapy dose.   

## 2014-08-22 NOTE — Assessment & Plan Note (Signed)
This is likely due to recent treatment. The patient denies recent history of fevers, cough, chills, diarrhea or dysuria. She is asymptomatic from the leukopenia. I will observe for now.  I will continue the chemotherapy at current dose without dosage adjustment.  If the leukopenia gets progressive worse in the future, I might have to delay her treatment or adjust the chemotherapy dose. She will get Neulasta at day 8 every cycle of treatment

## 2014-08-22 NOTE — Telephone Encounter (Signed)
Pt confirmed labs/ov per 10/08 POF.... Gave Pt AVS... KJ

## 2014-08-22 NOTE — Telephone Encounter (Signed)
Per staff message and POF I have scheduled appts. Advised scheduler of appts. JMW  

## 2014-08-22 NOTE — Assessment & Plan Note (Signed)

## 2014-08-22 NOTE — Assessment & Plan Note (Signed)
She tolerated treatment well apart from minor side effects. PET/CT scan show positive response to treatment. I will add Neulasta to future treatment cycles to be give at day 8.  I plan to get in another 3 cycles of chemotherapy, for a total of 6 cycles of treatment before we repeat staging PET/CT scan. After that I plan for maintenance Erbitux in the future

## 2014-08-22 NOTE — Progress Notes (Signed)
[facility] OFFICE PROGRESS NOTE  Patient Care Team: Baruch Goldmann, [place] as PCP - General (Physician Assistant) Thea Silversmith, MD (Radiation Oncology) Jerrell Belfast, MD (Otolaryngology) Heath Lark, MD as Consulting Physician (Hematology and Oncology) Brooks Sailors, RN as Oncology Nurse Navigator (Oncology)  SUMMARY OF ONCOLOGIC HISTORY: Oncology History   Cancer of hypopharynx, base of tongue, squamous cell carcinoma, HPV focally positive   Primary site: Pharynx - Hypopharynx   Staging method: AJCC 7th Edition   Clinical: Stage III (T2, N1, M0) signed by Thea Silversmith, MD on 10/07/2011 11:50 AM   Summary: Stage III (T2, N1, M0)       Cancer of hypopharynx   07/21/2011 Pathology Results Neck fine needle aspirate was negative for malignancy.   09/03/2011 Pathology Results Right neck fine needle aspirate and biopsy confirmed malignancy, squamous cell carcinoma, HPV marginally positive.   09/30/2011 Imaging PET CT scan confirmed right hypopharynx mass with regional lymphadenopathy.   10/28/2011 - 12/17/2011 Radiation Therapy The patient completed radiation therapy.   10/28/2011 - 12/23/2011 Chemotherapy The patient received 3 doses of high dose cisplatin. Her last dose of chemotherapy have 25% dose adjustment due to side effects.   03/16/2012 Imaging Repeat PET scan confirmed near complete response to treatment.   05/23/2012 Imaging Repeat PET/CT scan confirmed recurrence of disease in the right neck lymph node.   06/14/2012 Pathology Results Pathology confirmed only one lymph node involvement.   06/14/2012 Surgery The patient underwent right neck dissection for residual and recurrence of disease.   04/17/2014 Imaging Chest x-ray show multiple pulmonary nodules. CT scan of the neck showed no evidence of recurrence of disease.   04/18/2014 Imaging CT chest confirmed multiple pulmonary nodules highly suspicious for metastatic disease.   05/21/2014 Imaging PET CT scan showed multiple  pulmonary nodules which are hypermetabolic.   05/27/2014 Procedure [id number] CT-guided biopsy confirms malignant cells, compatible with squamous cell carcinoma.   06/13/2014 -  Chemotherapy She is started on palliative treatment with carboplatin, 5-FU and Erbitux   07/25/2014 Adverse Reaction Cycle 3 chemo is held and delayed due to leukopenia   08/20/2014 Imaging CT scan showed partial response to treatment.    INTERVAL HISTORY: Please see below for problem oriented charting. She is seen today to review test results. She denies side effects from prior chemotherapy.  REVIEW OF SYSTEMS:   Constitutional: Denies fevers, chills or abnormal weight loss Eyes: Denies blurriness of vision Ears, nose, mouth, throat, and face: Denies mucositis or sore throat Respiratory: Denies cough, dyspnea or wheezes Cardiovascular: Denies palpitation, chest discomfort or lower extremity swelling Gastrointestinal:  Denies nausea, heartburn or change in bowel habits Skin: Denies abnormal skin rashes Lymphatics: Denies new lymphadenopathy or easy bruising Neurological:Denies numbness, tingling or new weaknesses Behavioral/Psych: Mood is stable, no new changes  All other systems were reviewed with the patient and are negative.  I have reviewed the past medical history, past surgical history, social history and family history with the patient and they are unchanged from previous note.  ALLERGIES:  has No Known Allergies.  MEDICATIONS:  Current Outpatient Prescriptions  Medication Sig Dispense Refill  . Alum & Mag Hydroxide-Simeth (MAGIC MOUTHWASH) SOLN Take 5 mLs by mouth 4 (four) times daily as needed for mouth pain.  240 mL  2  . calcium carbonate (OS-CAL) 1250 MG chewable tablet Chew 1 tablet by mouth daily.       . Cholecalciferol (VITAMIN D) 2000 UNITS CAPS Take 2,000 Units by mouth daily.       Marland Kitchen  clindamycin (CLINDAGEL) 1 % gel Apply topically 2 (two) times daily.  30 g  0  . fexofenadine-pseudoephedrine  (ALLEGRA-D 24) 180-240 MG per 24 hr tablet Take 1 tablet by mouth daily as needed (for allergies).       . hydrocortisone 1 % ointment Apply 1 application topically 2 (two) times daily.  30 g  0  . lidocaine-prilocaine (EMLA) cream Apply 1 application topically as needed.  30 g  6  . omeprazole (PRILOSEC) 40 MG capsule Take 40 mg by mouth daily as needed (Acid reflux).       . ondansetron (ZOFRAN) 8 MG tablet Take 1 tablet (8 mg total) by mouth every 8 (eight) hours as needed.  60 tablet  1  . OVER THE COUNTER MEDICATION Take 1 tablet by mouth daily. MED NAME: Vitamin B-17      . oxyCODONE (OXY IR/ROXICODONE) 5 MG immediate release tablet Take 1 tablet (5 mg total) by mouth every 4 (four) hours as needed for severe pain.  30 tablet  0  . Polyethyl Glycol-Propyl Glycol (SYSTANE OP) Place 1 drop into both eyes 3 (three) times daily as needed (for allergies).       . pravastatin (PRAVACHOL) 40 MG tablet Take 40 mg by mouth daily.      . prochlorperazine (COMPAZINE) 10 MG tablet Take 1 tablet (10 mg total) by mouth every 6 (six) hours as needed (Nausea or vomiting).  30 tablet  1   No current facility-administered medications for this visit.    PHYSICAL EXAMINATION: ECOG PERFORMANCE STATUS: 0 - Asymptomatic  Filed Vitals:   08/22/14 0838  BP: 124/53  Pulse: 70  Temp: 98 F (36.7 C)  Resp: 19   Filed Weights   08/22/14 0838  Weight: 129 lb 6.4 oz (58.695 kg)    GENERAL:alert, no distress and comfortable SKIN: skin color, texture, turgor are normal, no rashes or significant lesions EYES: normal, Conjunctiva are pink and non-injected, sclera clear OROPHARYNX:no exudate, no erythema and lips, buccal mucosa, and tongue normal  NECK: supple, thyroid normal size, non-tender, without nodularity LYMPH:  no palpable lymphadenopathy in the cervical, axillary or inguinal LUNGS: clear to auscultation and percussion with normal breathing effort HEART: regular rate & rhythm and no murmurs and no  lower extremity edema ABDOMEN:abdomen soft, non-tender and normal bowel sounds Musculoskeletal:no cyanosis of digits and no clubbing  NEURO: alert & oriented x 3 with fluent speech, no focal motor/sensory deficits  LABORATORY DATA:  I have reviewed the data as listed    Component Value Date/Time   NA 141 08/22/2014 0826   NA 140 06/07/2012 1241   K 4.2 08/22/2014 0826   K 3.9 06/07/2012 1241   CL 103 04/17/2013 0803   CL 103 06/07/2012 1241   CO2 27 08/22/2014 0826   CO2 28 06/07/2012 1241   GLUCOSE 110 08/22/2014 0826   GLUCOSE 78 04/17/2013 0803   GLUCOSE 91 06/07/2012 1241   BUN 11.7 08/22/2014 0826   BUN 16 06/07/2012 1241   CREATININE 0.8 08/22/2014 0826   CREATININE 1.05 06/07/2012 1241   CALCIUM 9.4 08/22/2014 0826   CALCIUM 9.3 06/07/2012 1241   PROT 6.9 08/22/2014 0826   PROT 6.3 03/17/2012 0824   ALBUMIN 3.6 08/22/2014 0826   ALBUMIN 4.1 03/17/2012 0824   AST 13 08/22/2014 0826   AST 16 03/17/2012 0824   ALT 15 08/22/2014 0826   ALT <8 03/17/2012 0824   ALKPHOS 112 08/22/2014 0826   ALKPHOS 61 03/17/2012 0824  BILITOT 0.28 08/22/2014 0826   BILITOT 0.2* 03/17/2012 0824   GFRNONAA 55* 06/07/2012 1241   GFRAA 64* 06/07/2012 1241    No results found for this basename: SPEP, UPEP,  kappa and lambda light chains    Lab Results  Component Value Date   WBC 3.8* 08/22/2014   NEUTROABS 2.3 08/22/2014   HGB 11.0* 08/22/2014   HCT 34.2* 08/22/2014   MCV 98.3 08/22/2014   PLT 114* 08/22/2014      Chemistry      Component Value Date/Time   NA 141 08/22/2014 0826   NA 140 06/07/2012 1241   K 4.2 08/22/2014 0826   K 3.9 06/07/2012 1241   CL 103 04/17/2013 0803   CL 103 06/07/2012 1241   CO2 27 08/22/2014 0826   CO2 28 06/07/2012 1241   BUN 11.7 08/22/2014 0826   BUN 16 06/07/2012 1241   CREATININE 0.8 08/22/2014 0826   CREATININE 1.05 06/07/2012 1241      Component Value Date/Time   CALCIUM 9.4 08/22/2014 0826   CALCIUM 9.3 06/07/2012 1241   ALKPHOS 112 08/22/2014 0826   ALKPHOS 61 03/17/2012 0824   AST 13  08/22/2014 0826   AST 16 03/17/2012 0824   ALT 15 08/22/2014 0826   ALT <8 03/17/2012 0824   BILITOT 0.28 08/22/2014 0826   BILITOT 0.2* 03/17/2012 0824       RADIOGRAPHIC STUDIES: I reviewed the PET/CT scan with her and her husband I have personally reviewed the radiological images as listed and agreed with the findings in the report.  ASSESSMENT & PLAN:  Cancer of hypopharynx She tolerated treatment well apart from minor side effects. PET/CT scan show positive response to treatment. I will add Neulasta to future treatment cycles to be give at day 8.  I plan to get in another 3 cycles of chemotherapy, for a total of 6 cycles of treatment before we repeat staging PET/CT scan. After that I plan for maintenance Erbitux in the future     Leukopenia due to antineoplastic chemotherapy This is likely due to recent treatment. The patient denies recent history of fevers, cough, chills, diarrhea or dysuria. She is asymptomatic from the leukopenia. I will observe for now.  I will continue the chemotherapy at current dose without dosage adjustment.  If the leukopenia gets progressive worse in the future, I might have to delay her treatment or adjust the chemotherapy dose. She will get Neulasta at day 8 every cycle of treatment     Anemia in neoplastic disease This is likely due to recent treatment. The patient denies recent history of bleeding such as epistaxis, hematuria or hematochezia. She is asymptomatic from the anemia. I will observe for now.  She does not require transfusion now. I will continue the chemotherapy at current dose without dosage adjustment.  If the anemia gets progressive worse in the future, I might have to delay her treatment or adjust the chemotherapy dose.  Thrombocytopenia due to drugs This is likely due to recent treatment. The patient denies recent history of bleeding such as epistaxis, hematuria or hematochezia. She is asymptomatic from the low platelet count. I will  observe for now.  she does not require transfusion now. I will continue the chemotherapy at current dose without dosage adjustment.  If the thrombocytopenia gets progressive worse in the future, I might have to delay her treatment or adjust the chemotherapy dose.   No orders of the defined types were placed in this encounter.   All questions  were answered. The patient knows to call the clinic with any problems, questions or concerns. No barriers to learning was detected. I spent 40 minutes counseling the patient face to face. The total time spent in the appointment was 55 minutes and more than 50% was on counseling and review of test results     [facility], [place], MD 08/22/2014 7:45 PM

## 2014-08-29 ENCOUNTER — Other Ambulatory Visit: Payer: Self-pay | Admitting: Hematology and Oncology

## 2014-08-29 ENCOUNTER — Ambulatory Visit (HOSPITAL_BASED_OUTPATIENT_CLINIC_OR_DEPARTMENT_OTHER): Payer: Medicare Other

## 2014-08-29 VITALS — BP 132/70 | HR 69 | Temp 98.3°F

## 2014-08-29 DIAGNOSIS — Z5111 Encounter for antineoplastic chemotherapy: Secondary | ICD-10-CM

## 2014-08-29 DIAGNOSIS — C139 Malignant neoplasm of hypopharynx, unspecified: Secondary | ICD-10-CM

## 2014-08-29 MED ORDER — SODIUM CHLORIDE 0.9 % IV SOLN
388.5000 mg | Freq: Once | INTRAVENOUS | Status: AC
  Administered 2014-08-29: 390 mg via INTRAVENOUS
  Filled 2014-08-29: qty 39

## 2014-08-29 MED ORDER — SODIUM CHLORIDE 0.9 % IV SOLN
800.0000 mg/m2/d | INTRAVENOUS | Status: DC
  Administered 2014-08-29: 4800 mg via INTRAVENOUS
  Filled 2014-08-29: qty 96

## 2014-08-29 MED ORDER — DEXAMETHASONE SODIUM PHOSPHATE 20 MG/5ML IJ SOLN
INTRAMUSCULAR | Status: AC
  Filled 2014-08-29: qty 5

## 2014-08-29 MED ORDER — DIPHENHYDRAMINE HCL 50 MG/ML IJ SOLN
INTRAMUSCULAR | Status: AC
  Filled 2014-08-29: qty 1

## 2014-08-29 MED ORDER — ONDANSETRON 16 MG/50ML IVPB (CHCC)
INTRAVENOUS | Status: AC
  Filled 2014-08-29: qty 16

## 2014-08-29 MED ORDER — SODIUM CHLORIDE 0.9 % IV SOLN
Freq: Once | INTRAVENOUS | Status: AC
  Administered 2014-08-29: 10:00:00 via INTRAVENOUS

## 2014-08-29 MED ORDER — CETUXIMAB CHEMO IV INJECTION 200 MG/100ML
250.0000 mg/m2 | Freq: Once | INTRAVENOUS | Status: AC
  Administered 2014-08-29: 400 mg via INTRAVENOUS
  Filled 2014-08-29: qty 200

## 2014-08-29 MED ORDER — DIPHENHYDRAMINE HCL 50 MG/ML IJ SOLN
50.0000 mg | Freq: Once | INTRAMUSCULAR | Status: AC
  Administered 2014-08-29: 50 mg via INTRAVENOUS

## 2014-08-29 MED ORDER — DEXAMETHASONE SODIUM PHOSPHATE 20 MG/5ML IJ SOLN
20.0000 mg | Freq: Once | INTRAMUSCULAR | Status: AC
  Administered 2014-08-29: 20 mg via INTRAVENOUS

## 2014-08-29 MED ORDER — ONDANSETRON 16 MG/50ML IVPB (CHCC)
16.0000 mg | Freq: Once | INTRAVENOUS | Status: AC
  Administered 2014-08-29: 16 mg via INTRAVENOUS

## 2014-08-29 NOTE — Patient Instructions (Signed)
[facility] Discharge Instructions for Patients Receiving Chemotherapy  Today you received the following chemotherapy agents Erbitux/Carboplatin/5FU.  To help prevent nausea and vomiting after your treatment, we encourage you to take your nausea medication as prescribed.   If you develop nausea and vomiting that is not controlled by your nausea medication, call the clinic.   BELOW ARE SYMPTOMS THAT SHOULD BE REPORTED IMMEDIATELY:  *FEVER GREATER THAN 100.5 F  *CHILLS WITH OR WITHOUT FEVER  NAUSEA AND VOMITING THAT IS NOT CONTROLLED WITH YOUR NAUSEA MEDICATION  *UNUSUAL SHORTNESS OF BREATH  *UNUSUAL BRUISING OR BLEEDING  TENDERNESS IN MOUTH AND THROAT WITH OR WITHOUT PRESENCE OF ULCERS  *URINARY PROBLEMS  *BOWEL PROBLEMS  UNUSUAL RASH Items with * indicate a potential emergency and should be followed up as soon as possible.  Feel free to call the clinic you have any questions or concerns. The clinic phone number is [phone].

## 2014-08-30 ENCOUNTER — Other Ambulatory Visit: Payer: Self-pay

## 2014-08-30 ENCOUNTER — Telehealth: Payer: Self-pay | Admitting: Hematology and Oncology

## 2014-08-30 NOTE — Telephone Encounter (Signed)
returned pt call and lvm for pt regarding to OCT appts.Sherry KitchenMarland Craig

## 2014-09-02 ENCOUNTER — Ambulatory Visit (HOSPITAL_BASED_OUTPATIENT_CLINIC_OR_DEPARTMENT_OTHER): Payer: Medicare Other

## 2014-09-02 ENCOUNTER — Telehealth: Payer: Self-pay | Admitting: Hematology and Oncology

## 2014-09-02 ENCOUNTER — Telehealth: Payer: Self-pay | Admitting: *Deleted

## 2014-09-02 VITALS — BP 123/61 | HR 87 | Temp 97.6°F

## 2014-09-02 DIAGNOSIS — C139 Malignant neoplasm of hypopharynx, unspecified: Secondary | ICD-10-CM

## 2014-09-02 MED ORDER — HEPARIN SOD (PORK) LOCK FLUSH 100 UNIT/ML IV SOLN
500.0000 [IU] | Freq: Once | INTRAVENOUS | Status: AC | PRN
  Administered 2014-09-02: 500 [IU]
  Filled 2014-09-02: qty 5

## 2014-09-02 MED ORDER — SODIUM CHLORIDE 0.9 % IJ SOLN
10.0000 mL | INTRAMUSCULAR | Status: DC | PRN
  Administered 2014-09-02: 10 mL
  Filled 2014-09-02: qty 10

## 2014-09-02 NOTE — Telephone Encounter (Signed)
Pt came in to see about r/s chemo on the 22nd and 29th of October. Pt is req all visits be in the early am and per pt 10/22 pt will be going to a funeral that afternoon...Marland KitchenMarland KitchenMarland Kitchen Sent message to chemo to r/s time to early that day...Marland KitchenMarland Kitchen

## 2014-09-02 NOTE — Patient Instructions (Signed)

## 2014-09-02 NOTE — Telephone Encounter (Signed)
Per staff message I have moved 10/22 and 10/29 to earlier on the morning.

## 2014-09-05 ENCOUNTER — Ambulatory Visit (HOSPITAL_BASED_OUTPATIENT_CLINIC_OR_DEPARTMENT_OTHER): Payer: Medicare Other

## 2014-09-05 ENCOUNTER — Ambulatory Visit: Payer: Self-pay

## 2014-09-05 VITALS — BP 125/68 | HR 69 | Temp 98.2°F | Resp 18

## 2014-09-05 DIAGNOSIS — Z5189 Encounter for other specified aftercare: Secondary | ICD-10-CM

## 2014-09-05 DIAGNOSIS — Z5112 Encounter for antineoplastic immunotherapy: Secondary | ICD-10-CM

## 2014-09-05 DIAGNOSIS — C139 Malignant neoplasm of hypopharynx, unspecified: Secondary | ICD-10-CM

## 2014-09-05 MED ORDER — SODIUM CHLORIDE 0.9 % IJ SOLN
10.0000 mL | INTRAMUSCULAR | Status: DC | PRN
  Administered 2014-09-05: 10 mL
  Filled 2014-09-05: qty 10

## 2014-09-05 MED ORDER — PEGFILGRASTIM INJECTION 6 MG/0.6ML
6.0000 mg | Freq: Once | SUBCUTANEOUS | Status: AC
  Administered 2014-09-05: 6 mg via SUBCUTANEOUS
  Filled 2014-09-05: qty 0.6

## 2014-09-05 MED ORDER — CETUXIMAB CHEMO IV INJECTION 200 MG/100ML
250.0000 mg/m2 | Freq: Once | INTRAVENOUS | Status: AC
  Administered 2014-09-05: 400 mg via INTRAVENOUS
  Filled 2014-09-05: qty 200

## 2014-09-05 MED ORDER — HEPARIN SOD (PORK) LOCK FLUSH 100 UNIT/ML IV SOLN
500.0000 [IU] | Freq: Once | INTRAVENOUS | Status: AC | PRN
  Administered 2014-09-05: 500 [IU]
  Filled 2014-09-05: qty 5

## 2014-09-05 MED ORDER — DIPHENHYDRAMINE HCL 50 MG/ML IJ SOLN
50.0000 mg | Freq: Once | INTRAMUSCULAR | Status: AC
  Administered 2014-09-05: 50 mg via INTRAVENOUS

## 2014-09-05 MED ORDER — DIPHENHYDRAMINE HCL 50 MG/ML IJ SOLN
INTRAMUSCULAR | Status: AC
  Filled 2014-09-05: qty 1

## 2014-09-05 MED ORDER — SODIUM CHLORIDE 0.9 % IV SOLN
Freq: Once | INTRAVENOUS | Status: AC
  Administered 2014-09-05: 08:00:00 via INTRAVENOUS

## 2014-09-05 NOTE — Progress Notes (Signed)
Patient wanted to leave a little early to attend a funeral.

## 2014-09-05 NOTE — Patient Instructions (Signed)
[facility] Discharge Instructions for Patients Receiving Chemotherapy  Today you received the following chemotherapy agents Erbitux.  To help prevent nausea and vomiting after your treatment, we encourage you to take your nausea medication as prescribed.   If you develop nausea and vomiting that is not controlled by your nausea medication, call the clinic.   BELOW ARE SYMPTOMS THAT SHOULD BE REPORTED IMMEDIATELY:  *FEVER GREATER THAN 100.5 F  *CHILLS WITH OR WITHOUT FEVER  NAUSEA AND VOMITING THAT IS NOT CONTROLLED WITH YOUR NAUSEA MEDICATION  *UNUSUAL SHORTNESS OF BREATH  *UNUSUAL BRUISING OR BLEEDING  TENDERNESS IN MOUTH AND THROAT WITH OR WITHOUT PRESENCE OF ULCERS  *URINARY PROBLEMS  *BOWEL PROBLEMS  UNUSUAL RASH Items with * indicate a potential emergency and should be followed up as soon as possible.  Feel free to call the clinic you have any questions or concerns. The clinic phone number is [phone].

## 2014-09-11 ENCOUNTER — Other Ambulatory Visit: Payer: Self-pay | Admitting: Nurse Practitioner

## 2014-09-12 ENCOUNTER — Ambulatory Visit (HOSPITAL_BASED_OUTPATIENT_CLINIC_OR_DEPARTMENT_OTHER): Payer: Medicare Other

## 2014-09-12 ENCOUNTER — Ambulatory Visit: Payer: Self-pay

## 2014-09-12 VITALS — BP 112/50 | HR 88 | Temp 98.1°F

## 2014-09-12 DIAGNOSIS — Z5112 Encounter for antineoplastic immunotherapy: Secondary | ICD-10-CM

## 2014-09-12 DIAGNOSIS — C139 Malignant neoplasm of hypopharynx, unspecified: Secondary | ICD-10-CM

## 2014-09-12 MED ORDER — DIPHENHYDRAMINE HCL 50 MG/ML IJ SOLN
INTRAMUSCULAR | Status: AC
  Filled 2014-09-12: qty 1

## 2014-09-12 MED ORDER — SODIUM CHLORIDE 0.9 % IV SOLN
Freq: Once | INTRAVENOUS | Status: AC
  Administered 2014-09-12: 08:00:00 via INTRAVENOUS

## 2014-09-12 MED ORDER — SODIUM CHLORIDE 0.9 % IJ SOLN
10.0000 mL | INTRAMUSCULAR | Status: DC | PRN
  Administered 2014-09-12: 10 mL
  Filled 2014-09-12: qty 10

## 2014-09-12 MED ORDER — HEPARIN SOD (PORK) LOCK FLUSH 100 UNIT/ML IV SOLN
500.0000 [IU] | Freq: Once | INTRAVENOUS | Status: AC | PRN
  Administered 2014-09-12: 500 [IU]
  Filled 2014-09-12: qty 5

## 2014-09-12 MED ORDER — CETUXIMAB CHEMO IV INJECTION 200 MG/100ML
250.0000 mg/m2 | Freq: Once | INTRAVENOUS | Status: AC
  Administered 2014-09-12: 400 mg via INTRAVENOUS
  Filled 2014-09-12: qty 200

## 2014-09-12 MED ORDER — DIPHENHYDRAMINE HCL 50 MG/ML IJ SOLN
50.0000 mg | Freq: Once | INTRAMUSCULAR | Status: AC
  Administered 2014-09-12: 50 mg via INTRAVENOUS

## 2014-09-12 NOTE — Patient Instructions (Signed)
[facility] Discharge Instructions for Patients Receiving Chemotherapy  Today you received the following chemotherapy agents erbitux  To help prevent nausea and vomiting after your treatment, we encourage you to take your nausea medication as directed   If you develop nausea and vomiting that is not controlled by your nausea medication, call the clinic.   BELOW ARE SYMPTOMS THAT SHOULD BE REPORTED IMMEDIATELY:  *FEVER GREATER THAN 100.5 F  *CHILLS WITH OR WITHOUT FEVER  NAUSEA AND VOMITING THAT IS NOT CONTROLLED WITH YOUR NAUSEA MEDICATION  *UNUSUAL SHORTNESS OF BREATH  *UNUSUAL BRUISING OR BLEEDING  TENDERNESS IN MOUTH AND THROAT WITH OR WITHOUT PRESENCE OF ULCERS  *URINARY PROBLEMS  *BOWEL PROBLEMS  UNUSUAL RASH Items with * indicate a potential emergency and should be followed up as soon as possible.  Feel free to call the clinic you have any questions or concerns. The clinic phone number is [phone].

## 2014-09-13 ENCOUNTER — Telehealth: Payer: Self-pay | Admitting: *Deleted

## 2014-09-13 NOTE — Telephone Encounter (Signed)
Asked pt to take picture and send to Dr. Calton Dach cell phone. She agreed.

## 2014-09-13 NOTE — Telephone Encounter (Signed)
Only on lips or inside the mouth also? If she has viral sore, she can get Abreva or acyclovir. If it is Erbitux related, then we may need topical antibiotics. Pictures please

## 2014-09-13 NOTE — Telephone Encounter (Signed)
Pt reports two "cold sores" on her lips.  Says she is using MMW for the sores inside her mouth, but unsure what is best to put on her lips?

## 2014-09-13 NOTE — Telephone Encounter (Signed)
Informed pt Dr. Alvy Bimler says her sores are from Erbitux.  She can continue to use MMW and she can also use ice as needed for discomfort.  She does not recommend any other other the counter remedies. Pt verbalized understanding.

## 2014-09-19 ENCOUNTER — Encounter: Payer: Self-pay | Admitting: *Deleted

## 2014-09-19 ENCOUNTER — Ambulatory Visit (HOSPITAL_BASED_OUTPATIENT_CLINIC_OR_DEPARTMENT_OTHER): Payer: Medicare Other | Admitting: Hematology and Oncology

## 2014-09-19 ENCOUNTER — Ambulatory Visit (HOSPITAL_BASED_OUTPATIENT_CLINIC_OR_DEPARTMENT_OTHER): Payer: Medicare Other

## 2014-09-19 ENCOUNTER — Other Ambulatory Visit (HOSPITAL_BASED_OUTPATIENT_CLINIC_OR_DEPARTMENT_OTHER): Payer: Medicare Other

## 2014-09-19 VITALS — BP 130/54 | HR 88 | Temp 98.2°F | Resp 17 | Ht 61.0 in | Wt 128.4 lb

## 2014-09-19 DIAGNOSIS — C139 Malignant neoplasm of hypopharynx, unspecified: Secondary | ICD-10-CM

## 2014-09-19 DIAGNOSIS — K1231 Oral mucositis (ulcerative) due to antineoplastic therapy: Secondary | ICD-10-CM

## 2014-09-19 DIAGNOSIS — D630 Anemia in neoplastic disease: Secondary | ICD-10-CM

## 2014-09-19 DIAGNOSIS — Z5112 Encounter for antineoplastic immunotherapy: Secondary | ICD-10-CM

## 2014-09-19 DIAGNOSIS — Z5111 Encounter for antineoplastic chemotherapy: Secondary | ICD-10-CM

## 2014-09-19 DIAGNOSIS — R21 Rash and other nonspecific skin eruption: Secondary | ICD-10-CM

## 2014-09-19 LAB — COMPREHENSIVE METABOLIC PANEL (CC13)
ALK PHOS: 118 U/L (ref 40–150)
ALT: 16 U/L (ref 0–55)
AST: 16 U/L (ref 5–34)
Albumin: 3.9 g/dL (ref 3.5–5.0)
Anion Gap: 11 mEq/L (ref 3–11)
BUN: 13.8 mg/dL (ref 7.0–26.0)
CO2: 26 meq/L (ref 22–29)
Calcium: 9.9 mg/dL (ref 8.4–10.4)
Chloride: 105 mEq/L (ref 98–109)
Creatinine: 0.8 mg/dL (ref 0.6–1.1)
GLUCOSE: 81 mg/dL (ref 70–140)
POTASSIUM: 4 meq/L (ref 3.5–5.1)
SODIUM: 142 meq/L (ref 136–145)
Total Bilirubin: 0.36 mg/dL (ref 0.20–1.20)
Total Protein: 7.1 g/dL (ref 6.4–8.3)

## 2014-09-19 LAB — CBC WITH DIFFERENTIAL/PLATELET
BASO%: 0.6 % (ref 0.0–2.0)
Basophils Absolute: 0 10*3/uL (ref 0.0–0.1)
EOS%: 1.4 % (ref 0.0–7.0)
Eosinophils Absolute: 0.1 10*3/uL (ref 0.0–0.5)
HCT: 35.5 % (ref 34.8–46.6)
HGB: 11.5 g/dL — ABNORMAL LOW (ref 11.6–15.9)
LYMPH%: 16.9 % (ref 14.0–49.7)
MCH: 33.3 pg (ref 25.1–34.0)
MCHC: 32.5 g/dL (ref 31.5–36.0)
MCV: 102.6 fL — ABNORMAL HIGH (ref 79.5–101.0)
MONO#: 0.3 10*3/uL (ref 0.1–0.9)
MONO%: 5 % (ref 0.0–14.0)
NEUT%: 76.1 % (ref 38.4–76.8)
NEUTROS ABS: 4.9 10*3/uL (ref 1.5–6.5)
Platelets: 171 10*3/uL (ref 145–400)
RBC: 3.46 10*6/uL — AB (ref 3.70–5.45)
RDW: 20.7 % — AB (ref 11.2–14.5)
WBC: 6.5 10*3/uL (ref 3.9–10.3)
lymph#: 1.1 10*3/uL (ref 0.9–3.3)

## 2014-09-19 MED ORDER — CETUXIMAB CHEMO IV INJECTION 200 MG/100ML
250.0000 mg/m2 | Freq: Once | INTRAVENOUS | Status: AC
  Administered 2014-09-19: 400 mg via INTRAVENOUS
  Filled 2014-09-19: qty 200

## 2014-09-19 MED ORDER — SODIUM CHLORIDE 0.9 % IV SOLN
750.0000 mg/m2/d | INTRAVENOUS | Status: DC
  Administered 2014-09-19: 4750 mg via INTRAVENOUS
  Filled 2014-09-19: qty 95

## 2014-09-19 MED ORDER — DIPHENHYDRAMINE HCL 50 MG/ML IJ SOLN
INTRAMUSCULAR | Status: AC
  Filled 2014-09-19: qty 1

## 2014-09-19 MED ORDER — SODIUM CHLORIDE 0.9 % IV SOLN
Freq: Once | INTRAVENOUS | Status: AC
  Administered 2014-09-19: 10:00:00 via INTRAVENOUS

## 2014-09-19 MED ORDER — ONDANSETRON 16 MG/50ML IVPB (CHCC)
16.0000 mg | Freq: Once | INTRAVENOUS | Status: AC
  Administered 2014-09-19: 16 mg via INTRAVENOUS

## 2014-09-19 MED ORDER — ONDANSETRON 16 MG/50ML IVPB (CHCC)
INTRAVENOUS | Status: AC
  Filled 2014-09-19: qty 16

## 2014-09-19 MED ORDER — SODIUM CHLORIDE 0.9 % IV SOLN
390.0000 mg | Freq: Once | INTRAVENOUS | Status: AC
  Administered 2014-09-19: 390 mg via INTRAVENOUS
  Filled 2014-09-19: qty 39

## 2014-09-19 MED ORDER — DEXAMETHASONE SODIUM PHOSPHATE 20 MG/5ML IJ SOLN
20.0000 mg | Freq: Once | INTRAMUSCULAR | Status: AC
  Administered 2014-09-19: 20 mg via INTRAVENOUS

## 2014-09-19 MED ORDER — DIPHENHYDRAMINE HCL 50 MG/ML IJ SOLN
50.0000 mg | Freq: Once | INTRAMUSCULAR | Status: AC
  Administered 2014-09-19: 50 mg via INTRAVENOUS

## 2014-09-19 MED ORDER — DEXAMETHASONE SODIUM PHOSPHATE 20 MG/5ML IJ SOLN
INTRAMUSCULAR | Status: AC
  Filled 2014-09-19: qty 5

## 2014-09-19 NOTE — Patient Instructions (Signed)
[facility] Discharge Instructions for Patients Receiving Chemotherapy  Today you received the following chemotherapy agents; Erbitux, Carboplatin and Adrucil.  To help prevent nausea and vomiting after your treatment, we encourage you to take your nausea medication.   If you develop nausea and vomiting that is not controlled by your nausea medication, call the clinic.   BELOW ARE SYMPTOMS THAT SHOULD BE REPORTED IMMEDIATELY:  *FEVER GREATER THAN 100.5 F  *CHILLS WITH OR WITHOUT FEVER  NAUSEA AND VOMITING THAT IS NOT CONTROLLED WITH YOUR NAUSEA MEDICATION  *UNUSUAL SHORTNESS OF BREATH  *UNUSUAL BRUISING OR BLEEDING  TENDERNESS IN MOUTH AND THROAT WITH OR WITHOUT PRESENCE OF ULCERS  *URINARY PROBLEMS  *BOWEL PROBLEMS  UNUSUAL RASH Items with * indicate a potential emergency and should be followed up as soon as possible.  Feel free to call the clinic you have any questions or concerns. The clinic phone number is [phone].

## 2014-09-19 NOTE — Progress Notes (Signed)
To provide support and encouragement, care continuity and to assess for needs, met with patient and her husband during appt with Dr. Alvy Bimler and prior to chemo 5 of 6.  She did not express needs or concerns, they understand they can contact me if that changes.  Gayleen Orem, RN, BSN, [facility] [phone]

## 2014-09-19 NOTE — Assessment & Plan Note (Signed)

## 2014-09-19 NOTE — Assessment & Plan Note (Signed)
She tolerated treatment well apart from minor side effects. PET/CT scan show positive response to treatment. I will add Neulasta to future treatment cycles to be give at day 8.  I plan to give total of 6 cycles of chemotherapy before we repeat staging PET/CT scan. After that I plan for maintenance Erbitux in the future. For cycle 6, the patient does not mind having her treatment delayed due to the holidays

## 2014-09-19 NOTE — Progress Notes (Signed)
[facility] OFFICE PROGRESS NOTE  Patient Care Team: Baruch Goldmann, [place] as PCP - General (Physician Assistant) Thea Silversmith, MD (Radiation Oncology) Jerrell Belfast, MD (Otolaryngology) Heath Lark, MD as Consulting Physician (Hematology and Oncology) Brooks Sailors, RN as Oncology Nurse Navigator (Oncology)  SUMMARY OF ONCOLOGIC HISTORY: Oncology History   Cancer of hypopharynx, base of tongue, squamous cell carcinoma, HPV focally positive   Primary site: Pharynx - Hypopharynx   Staging method: AJCC 7th Edition   Clinical: Stage III (T2, N1, M0) signed by Thea Silversmith, MD on 10/07/2011 11:50 AM   Summary: Stage III (T2, N1, M0)       Cancer of hypopharynx   07/21/2011 Pathology Results Neck fine needle aspirate was negative for malignancy.   09/03/2011 Pathology Results Right neck fine needle aspirate and biopsy confirmed malignancy, squamous cell carcinoma, HPV marginally positive.   09/30/2011 Imaging PET CT scan confirmed right hypopharynx mass with regional lymphadenopathy.   10/28/2011 - 12/17/2011 Radiation Therapy The patient completed radiation therapy.   10/28/2011 - 12/23/2011 Chemotherapy The patient received 3 doses of high dose cisplatin. Her last dose of chemotherapy have 25% dose adjustment due to side effects.   03/16/2012 Imaging Repeat PET scan confirmed near complete response to treatment.   05/23/2012 Imaging Repeat PET/CT scan confirmed recurrence of disease in the right neck lymph node.   06/14/2012 Pathology Results Pathology confirmed only one lymph node involvement.   06/14/2012 Surgery The patient underwent right neck dissection for residual and recurrence of disease.   04/17/2014 Imaging Chest x-ray show multiple pulmonary nodules. CT scan of the neck showed no evidence of recurrence of disease.   04/18/2014 Imaging CT chest confirmed multiple pulmonary nodules highly suspicious for metastatic disease.   05/21/2014 Imaging PET CT scan showed multiple  pulmonary nodules which are hypermetabolic.   05/27/2014 Procedure [id number] CT-guided biopsy confirms malignant cells, compatible with squamous cell carcinoma.   06/13/2014 -  Chemotherapy She is started on palliative treatment with carboplatin, 5-FU and Erbitux   07/25/2014 Adverse Reaction Cycle 3 chemo is held and delayed due to leukopenia   08/20/2014 Imaging CT scan showed partial response to treatment.    INTERVAL HISTORY: Please see below for problem oriented charting. She is seen prior to cycle 5 of treatment. She complained of mucositis recently, resolved with conservative management.  REVIEW OF SYSTEMS:   Constitutional: Denies fevers, chills or abnormal weight loss Eyes: Denies blurriness of vision Respiratory: Denies cough, dyspnea or wheezes Cardiovascular: Denies palpitation, chest discomfort or lower extremity swelling Gastrointestinal:  Denies nausea, heartburn or change in bowel habits Skin: Denies abnormal skin rashes Lymphatics: Denies new lymphadenopathy or easy bruising Neurological:Denies numbness, tingling or new weaknesses Behavioral/Psych: Mood is stable, no new changes  All other systems were reviewed with the patient and are negative.  I have reviewed the past medical history, past surgical history, social history and family history with the patient and they are unchanged from previous note.  ALLERGIES:  has No Known Allergies.  MEDICATIONS:  Current Outpatient Prescriptions  Medication Sig Dispense Refill  . Alum & Mag Hydroxide-Simeth (MAGIC MOUTHWASH) SOLN Take 5 mLs by mouth 4 (four) times daily as needed for mouth pain. 240 mL 2  . calcium carbonate (OS-CAL) 1250 MG chewable tablet Chew 1 tablet by mouth daily.     . Cholecalciferol (VITAMIN D) 2000 UNITS CAPS Take 2,000 Units by mouth daily.     . clindamycin (CLINDAGEL) 1 % gel Apply topically 2 (two) times daily.  30 g 0  . fexofenadine-pseudoephedrine (ALLEGRA-D 24) 180-240 MG per 24 hr tablet Take 1  tablet by mouth daily as needed (for allergies).     . hydrocortisone 1 % ointment Apply 1 application topically 2 (two) times daily. 30 g 0  . lidocaine-prilocaine (EMLA) cream Apply 1 application topically as needed. 30 g 6  . omeprazole (PRILOSEC) 40 MG capsule Take 40 mg by mouth daily as needed (Acid reflux).     Marland Kitchen OVER THE COUNTER MEDICATION Take 1 tablet by mouth daily. MED NAME: Vitamin B-17    . oxyCODONE (OXY IR/ROXICODONE) 5 MG immediate release tablet Take 1 tablet (5 mg total) by mouth every 4 (four) hours as needed for severe pain. 30 tablet 0  . Polyethyl Glycol-Propyl Glycol (SYSTANE OP) Place 1 drop into both eyes 3 (three) times daily as needed (for allergies).     . pravastatin (PRAVACHOL) 40 MG tablet Take 40 mg by mouth daily.     No current facility-administered medications for this visit.    PHYSICAL EXAMINATION: ECOG PERFORMANCE STATUS: 1 - Symptomatic but completely ambulatory  Filed Vitals:   09/19/14 0837  BP: 130/54  Pulse: 88  Temp: 98.2 F (36.8 C)  Resp: 17   Filed Weights   09/19/14 0837  Weight: 128 lb 6.4 oz (58.242 kg)    GENERAL:alert, no distress and comfortable SKIN: skin color, texture, turgor are normal, no rashes or significant lesions EYES: normal, Conjunctiva are pink and non-injected, sclera clear OROPHARYNX:no exudate, no erythema and lips, buccal mucosa, and tongue normal  NECK: supple, thyroid normal size, non-tender, without nodularity LYMPH:  no palpable lymphadenopathy in the cervical, axillary or inguinal LUNGS: clear to auscultation and percussion with normal breathing effort HEART: regular rate & rhythm and no murmurs and no lower extremity edema ABDOMEN:abdomen soft, non-tender and normal bowel sounds Musculoskeletal:no cyanosis of digits and no clubbing  NEURO: alert & oriented x 3 with fluent speech, no focal motor/sensory deficits  LABORATORY DATA:  I have reviewed the data as listed    Component Value Date/Time   NA  142 09/19/2014 0828   NA 140 06/07/2012 1241   K 4.0 09/19/2014 0828   K 3.9 06/07/2012 1241   CL 103 04/17/2013 0803   CL 103 06/07/2012 1241   CO2 26 09/19/2014 0828   CO2 28 06/07/2012 1241   GLUCOSE 81 09/19/2014 0828   GLUCOSE 78 04/17/2013 0803   GLUCOSE 91 06/07/2012 1241   BUN 13.8 09/19/2014 0828   BUN 16 06/07/2012 1241   CREATININE 0.8 09/19/2014 0828   CREATININE 1.05 06/07/2012 1241   CALCIUM 9.9 09/19/2014 0828   CALCIUM 9.3 06/07/2012 1241   PROT 7.1 09/19/2014 0828   PROT 6.3 03/17/2012 0824   ALBUMIN 3.9 09/19/2014 0828   ALBUMIN 4.1 03/17/2012 0824   AST 16 09/19/2014 0828   AST 16 03/17/2012 0824   ALT 16 09/19/2014 0828   ALT <8 03/17/2012 0824   ALKPHOS 118 09/19/2014 0828   ALKPHOS 61 03/17/2012 0824   BILITOT 0.36 09/19/2014 0828   BILITOT 0.2* 03/17/2012 0824   GFRNONAA 55* 06/07/2012 1241   GFRAA 64* 06/07/2012 1241    No results found for: SPEP, UPEP  Lab Results  Component Value Date   WBC 6.5 09/19/2014   NEUTROABS 4.9 09/19/2014   HGB 11.5* 09/19/2014   HCT 35.5 09/19/2014   MCV 102.6* 09/19/2014   PLT 171 09/19/2014      Chemistry      Component  Value Date/Time   NA 142 09/19/2014 0828   NA 140 06/07/2012 1241   K 4.0 09/19/2014 0828   K 3.9 06/07/2012 1241   CL 103 04/17/2013 0803   CL 103 06/07/2012 1241   CO2 26 09/19/2014 0828   CO2 28 06/07/2012 1241   BUN 13.8 09/19/2014 0828   BUN 16 06/07/2012 1241   CREATININE 0.8 09/19/2014 0828   CREATININE 1.05 06/07/2012 1241      Component Value Date/Time   CALCIUM 9.9 09/19/2014 0828   CALCIUM 9.3 06/07/2012 1241   ALKPHOS 118 09/19/2014 0828   ALKPHOS 61 03/17/2012 0824   AST 16 09/19/2014 0828   AST 16 03/17/2012 0824   ALT 16 09/19/2014 0828   ALT <8 03/17/2012 0824   BILITOT 0.36 09/19/2014 0828   BILITOT 0.2* 03/17/2012 0824      ASSESSMENT & PLAN:  Cancer of hypopharynx She tolerated treatment well apart from minor side effects. PET/CT scan show positive  response to treatment. I will add Neulasta to future treatment cycles to be give at day 8.  I plan to give total of 6 cycles of chemotherapy before we repeat staging PET/CT scan. After that I plan for maintenance Erbitux in the future. For cycle 6, the patient does not mind having her treatment delayed due to the holidays       Anemia in neoplastic disease This is likely due to recent treatment. The patient denies recent history of bleeding such as epistaxis, hematuria or hematochezia. She is asymptomatic from the anemia. I will observe for now.  She does not require transfusion now. I will continue the chemotherapy at current dose without dosage adjustment.  If the anemia gets progressive worse in the future, I might have to delay her treatment or adjust the chemotherapy dose.  Mucositis due to antineoplastic therapy She will continue conservative management. I plan to reduce chemotherapy 5Fu dose further     Orders Placed This Encounter  Procedures  . Magnesium    Standing Status: Future     Number of Occurrences:      Standing Expiration Date: 10/24/2015   All questions were answered. The patient knows to call the clinic with any problems, questions or concerns. No barriers to learning was detected. I spent 30 minutes counseling the patient face to face. The total time spent in the appointment was 40 minutes and more than 50% was on counseling and review of test results     [facility], [place], MD 09/19/2014 7:51 PM

## 2014-09-19 NOTE — Assessment & Plan Note (Signed)
She will continue conservative management. I plan to reduce chemotherapy 5Fu dose further

## 2014-09-20 ENCOUNTER — Ambulatory Visit (HOSPITAL_BASED_OUTPATIENT_CLINIC_OR_DEPARTMENT_OTHER): Payer: Medicare Other

## 2014-09-20 ENCOUNTER — Telehealth: Payer: Self-pay | Admitting: *Deleted

## 2014-09-20 ENCOUNTER — Telehealth: Payer: Self-pay | Admitting: Hematology and Oncology

## 2014-09-20 DIAGNOSIS — C139 Malignant neoplasm of hypopharynx, unspecified: Secondary | ICD-10-CM

## 2014-09-20 MED ORDER — DOCUSATE SODIUM 100 MG PO CAPS: 100.0000 mg | ORAL_CAPSULE | Freq: Two times a day (BID) | ORAL | Status: AC | PRN

## 2014-09-20 MED ORDER — HYDROCORTISONE 2.5 % RE CREA: 1.0000 "application " | TOPICAL_CREAM | Freq: Two times a day (BID) | RECTAL | Status: DC

## 2014-09-20 MED ORDER — HYDROCORTISONE 2.5 % RE CREA: 1.0000 "application " | TOPICAL_CREAM | Freq: Two times a day (BID) | RECTAL | Status: AC

## 2014-09-20 NOTE — Telephone Encounter (Signed)
Pt reports hemorhroid pain in rectum.  States occasional blood w/ wiping.  She has had some hard stools and started taking some stool softener.  Anusol and colace ordered per Dr. Alvy Bimler.  Pt instructed on using anusol for one week only and let us know if hemorrhoids are not improved.  She verbalized understanding.

## 2014-09-20 NOTE — Telephone Encounter (Signed)
Per staff message and POF I have scheduled appts. Advised scheduler of appts. JMW  

## 2014-09-20 NOTE — Telephone Encounter (Signed)
Confirmed d/t for Nov appt. Pt can pick new sch up at next appt.

## 2014-09-20 NOTE — Telephone Encounter (Signed)
Pt called to f/u on rx for Anusol.  [facility] and they are out of stock and cannot get until Monday.  Asked them to cancel order.  Rx sent to [facility] on [address] and called to confirm they do have med and will fill for pt.. Pt is aware.

## 2014-09-20 NOTE — Progress Notes (Signed)
Patient complains that the wings of the needle feels as if there digging in. Pump infusing, dressing change performed using sterile technique, new biopatch placed. Patient states the pain is resolved and feels better. Patient discharged ambulatory.

## 2014-09-23 ENCOUNTER — Ambulatory Visit (HOSPITAL_BASED_OUTPATIENT_CLINIC_OR_DEPARTMENT_OTHER): Payer: Medicare Other

## 2014-09-23 DIAGNOSIS — C139 Malignant neoplasm of hypopharynx, unspecified: Secondary | ICD-10-CM

## 2014-09-23 MED ORDER — HEPARIN SOD (PORK) LOCK FLUSH 100 UNIT/ML IV SOLN
500.0000 [IU] | Freq: Once | INTRAVENOUS | Status: AC | PRN
  Administered 2014-09-23: 500 [IU]
  Filled 2014-09-23: qty 5

## 2014-09-23 MED ORDER — SODIUM CHLORIDE 0.9 % IJ SOLN
10.0000 mL | INTRAMUSCULAR | Status: DC | PRN
  Administered 2014-09-23: 10 mL
  Filled 2014-09-23: qty 10

## 2014-09-26 ENCOUNTER — Ambulatory Visit (HOSPITAL_BASED_OUTPATIENT_CLINIC_OR_DEPARTMENT_OTHER): Payer: Medicare Other

## 2014-09-26 ENCOUNTER — Telehealth: Payer: Self-pay | Admitting: Hematology and Oncology

## 2014-09-26 ENCOUNTER — Other Ambulatory Visit: Payer: Self-pay | Admitting: Family Medicine

## 2014-09-26 ENCOUNTER — Ambulatory Visit: Payer: Medicare Other

## 2014-09-26 ENCOUNTER — Other Ambulatory Visit: Payer: Self-pay | Admitting: *Deleted

## 2014-09-26 DIAGNOSIS — Z95828 Presence of other vascular implants and grafts: Secondary | ICD-10-CM

## 2014-09-26 DIAGNOSIS — Z5112 Encounter for antineoplastic immunotherapy: Secondary | ICD-10-CM

## 2014-09-26 DIAGNOSIS — E2839 Other primary ovarian failure: Secondary | ICD-10-CM

## 2014-09-26 DIAGNOSIS — C139 Malignant neoplasm of hypopharynx, unspecified: Secondary | ICD-10-CM

## 2014-09-26 DIAGNOSIS — R21 Rash and other nonspecific skin eruption: Secondary | ICD-10-CM

## 2014-09-26 LAB — COMPREHENSIVE METABOLIC PANEL (CC13)
ALBUMIN: 3.8 g/dL (ref 3.5–5.0)
ALT: 10 U/L (ref 0–55)
AST: 19 U/L (ref 5–34)
Alkaline Phosphatase: 110 U/L (ref 40–150)
Anion Gap: 7 mEq/L (ref 3–11)
BUN: 14.9 mg/dL (ref 7.0–26.0)
CALCIUM: 9.4 mg/dL (ref 8.4–10.4)
CO2: 26 mEq/L (ref 22–29)
Chloride: 107 mEq/L (ref 98–109)
Creatinine: 0.7 mg/dL (ref 0.6–1.1)
GLUCOSE: 100 mg/dL (ref 70–140)
POTASSIUM: 3.6 meq/L (ref 3.5–5.1)
SODIUM: 140 meq/L (ref 136–145)
TOTAL PROTEIN: 6.7 g/dL (ref 6.4–8.3)
Total Bilirubin: 0.36 mg/dL (ref 0.20–1.20)

## 2014-09-26 LAB — CBC WITH DIFFERENTIAL/PLATELET
BASO%: 0.3 % (ref 0.0–2.0)
Basophils Absolute: 0 10*3/uL (ref 0.0–0.1)
EOS ABS: 0.2 10*3/uL (ref 0.0–0.5)
EOS%: 4.7 % (ref 0.0–7.0)
HCT: 30.5 % — ABNORMAL LOW (ref 34.8–46.6)
HEMOGLOBIN: 10.1 g/dL — AB (ref 11.6–15.9)
LYMPH%: 28.2 % (ref 14.0–49.7)
MCH: 33.3 pg (ref 25.1–34.0)
MCHC: 33.1 g/dL (ref 31.5–36.0)
MCV: 100.7 fL (ref 79.5–101.0)
MONO#: 0.2 10*3/uL (ref 0.1–0.9)
MONO%: 6.2 % (ref 0.0–14.0)
NEUT%: 60.6 % (ref 38.4–76.8)
NEUTROS ABS: 2.1 10*3/uL (ref 1.5–6.5)
PLATELETS: 149 10*3/uL (ref 145–400)
RBC: 3.03 10*6/uL — AB (ref 3.70–5.45)
RDW: 17.8 % — ABNORMAL HIGH (ref 11.2–14.5)
WBC: 3.4 10*3/uL — ABNORMAL LOW (ref 3.9–10.3)
lymph#: 1 10*3/uL (ref 0.9–3.3)

## 2014-09-26 MED ORDER — SODIUM CHLORIDE 0.9 % IV SOLN
Freq: Once | INTRAVENOUS | Status: AC
  Administered 2014-09-26: 14:00:00 via INTRAVENOUS

## 2014-09-26 MED ORDER — DIPHENHYDRAMINE HCL 50 MG/ML IJ SOLN
INTRAMUSCULAR | Status: AC
  Filled 2014-09-26: qty 1

## 2014-09-26 MED ORDER — SODIUM CHLORIDE 0.9 % IJ SOLN
10.0000 mL | INTRAMUSCULAR | Status: DC | PRN
  Administered 2014-09-26: 10 mL
  Filled 2014-09-26: qty 10

## 2014-09-26 MED ORDER — SODIUM CHLORIDE 0.9 % IJ SOLN
10.0000 mL | INTRAMUSCULAR | Status: DC | PRN
  Administered 2014-09-26: 10 mL via INTRAVENOUS
  Filled 2014-09-26: qty 10

## 2014-09-26 MED ORDER — DIPHENHYDRAMINE HCL 50 MG/ML IJ SOLN
50.0000 mg | Freq: Once | INTRAMUSCULAR | Status: AC
  Administered 2014-09-26: 50 mg via INTRAVENOUS

## 2014-09-26 MED ORDER — HEPARIN SOD (PORK) LOCK FLUSH 100 UNIT/ML IV SOLN
500.0000 [IU] | Freq: Once | INTRAVENOUS | Status: AC | PRN
  Administered 2014-09-26: 500 [IU]
  Filled 2014-09-26: qty 5

## 2014-09-26 MED ORDER — CETUXIMAB CHEMO IV INJECTION 200 MG/100ML
250.0000 mg/m2 | Freq: Once | INTRAVENOUS | Status: AC
  Administered 2014-09-26: 400 mg via INTRAVENOUS
  Filled 2014-09-26: qty 200

## 2014-09-26 NOTE — Progress Notes (Signed)
1556: One hour post observation begins now.

## 2014-09-26 NOTE — Patient Instructions (Signed)

## 2014-09-26 NOTE — Patient Instructions (Signed)
[facility] Discharge Instructions for Patients Receiving Chemotherapy  Today you received the following chemotherapy agents Erbitux  To help prevent nausea and vomiting after your treatment, we encourage you to take your nausea medication as prescribed. If you develop nausea and vomiting that is not controlled by your nausea medication, call the clinic.   BELOW ARE SYMPTOMS THAT SHOULD BE REPORTED IMMEDIATELY:  *FEVER GREATER THAN 100.5 F  *CHILLS WITH OR WITHOUT FEVER  NAUSEA AND VOMITING THAT IS NOT CONTROLLED WITH YOUR NAUSEA MEDICATION  *UNUSUAL SHORTNESS OF BREATH  *UNUSUAL BRUISING OR BLEEDING  TENDERNESS IN MOUTH AND THROAT WITH OR WITHOUT PRESENCE OF ULCERS  *URINARY PROBLEMS  *BOWEL PROBLEMS  UNUSUAL RASH Items with * indicate a potential emergency and should be followed up as soon as possible.  Feel free to call the clinic you have any questions or concerns. The clinic phone number is [phone].

## 2014-09-26 NOTE — Telephone Encounter (Signed)
, °

## 2014-09-30 ENCOUNTER — Telehealth: Payer: Self-pay | Admitting: *Deleted

## 2014-09-30 NOTE — Telephone Encounter (Signed)
Informed pt that Dr Alvy Bimler wants her to stop Anusol, take stool softener Colace 100mg  BID and use sitz bath. Call back next Monday to let us know how she is doing.

## 2014-09-30 NOTE — Telephone Encounter (Signed)
Pt states she has been having rectal bleeding with wiping. Is not dripping blood, has been using cream X 1 week. Was to let Dr Alvy Bimler know if she was still bleeing

## 2014-10-03 ENCOUNTER — Ambulatory Visit (HOSPITAL_BASED_OUTPATIENT_CLINIC_OR_DEPARTMENT_OTHER): Payer: Medicare Other

## 2014-10-03 ENCOUNTER — Ambulatory Visit: Payer: Medicare Other

## 2014-10-03 ENCOUNTER — Other Ambulatory Visit (HOSPITAL_BASED_OUTPATIENT_CLINIC_OR_DEPARTMENT_OTHER): Payer: Medicare Other

## 2014-10-03 DIAGNOSIS — C139 Malignant neoplasm of hypopharynx, unspecified: Secondary | ICD-10-CM

## 2014-10-03 DIAGNOSIS — Z5111 Encounter for antineoplastic chemotherapy: Secondary | ICD-10-CM

## 2014-10-03 DIAGNOSIS — R21 Rash and other nonspecific skin eruption: Secondary | ICD-10-CM

## 2014-10-03 LAB — COMPREHENSIVE METABOLIC PANEL (CC13)
ALBUMIN: 3.9 g/dL (ref 3.5–5.0)
ALT: 10 U/L (ref 0–55)
ANION GAP: 9 meq/L (ref 3–11)
AST: 17 U/L (ref 5–34)
Alkaline Phosphatase: 115 U/L (ref 40–150)
BILIRUBIN TOTAL: 0.22 mg/dL (ref 0.20–1.20)
BUN: 12.6 mg/dL (ref 7.0–26.0)
CO2: 25 meq/L (ref 22–29)
Calcium: 9.4 mg/dL (ref 8.4–10.4)
Chloride: 105 mEq/L (ref 98–109)
Creatinine: 0.7 mg/dL (ref 0.6–1.1)
GLUCOSE: 89 mg/dL (ref 70–140)
Potassium: 4.1 mEq/L (ref 3.5–5.1)
SODIUM: 139 meq/L (ref 136–145)
Total Protein: 6.9 g/dL (ref 6.4–8.3)

## 2014-10-03 LAB — CBC WITH DIFFERENTIAL/PLATELET
BASO%: 0.4 % (ref 0.0–2.0)
BASOS ABS: 0 10*3/uL (ref 0.0–0.1)
EOS%: 3.6 % (ref 0.0–7.0)
Eosinophils Absolute: 0.1 10*3/uL (ref 0.0–0.5)
HCT: 31.2 % — ABNORMAL LOW (ref 34.8–46.6)
HEMOGLOBIN: 10.3 g/dL — AB (ref 11.6–15.9)
LYMPH%: 37.8 % (ref 14.0–49.7)
MCH: 33.9 pg (ref 25.1–34.0)
MCHC: 33 g/dL (ref 31.5–36.0)
MCV: 102.6 fL — ABNORMAL HIGH (ref 79.5–101.0)
MONO#: 0.2 10*3/uL (ref 0.1–0.9)
MONO%: 7.9 % (ref 0.0–14.0)
NEUT#: 1.4 10*3/uL — ABNORMAL LOW (ref 1.5–6.5)
NEUT%: 50.3 % (ref 38.4–76.8)
PLATELETS: 159 10*3/uL (ref 145–400)
RBC: 3.04 10*6/uL — AB (ref 3.70–5.45)
RDW: 18.1 % — ABNORMAL HIGH (ref 11.2–14.5)
WBC: 2.8 10*3/uL — AB (ref 3.9–10.3)
lymph#: 1.1 10*3/uL (ref 0.9–3.3)

## 2014-10-03 MED ORDER — SODIUM CHLORIDE 0.9 % IV SOLN
Freq: Once | INTRAVENOUS | Status: AC
  Administered 2014-10-03: 13:00:00 via INTRAVENOUS

## 2014-10-03 MED ORDER — DIPHENHYDRAMINE HCL 50 MG/ML IJ SOLN
INTRAMUSCULAR | Status: AC
  Filled 2014-10-03: qty 1

## 2014-10-03 MED ORDER — CETUXIMAB CHEMO IV INJECTION 200 MG/100ML
250.0000 mg/m2 | Freq: Once | INTRAVENOUS | Status: AC
  Administered 2014-10-03: 400 mg via INTRAVENOUS
  Filled 2014-10-03: qty 200

## 2014-10-03 MED ORDER — DIPHENHYDRAMINE HCL 50 MG/ML IJ SOLN
50.0000 mg | Freq: Once | INTRAMUSCULAR | Status: AC
  Administered 2014-10-03: 50 mg via INTRAVENOUS

## 2014-10-03 MED ORDER — SODIUM CHLORIDE 0.9 % IJ SOLN
10.0000 mL | INTRAMUSCULAR | Status: DC | PRN
  Administered 2014-10-03: 10 mL via INTRAVENOUS
  Filled 2014-10-03: qty 10

## 2014-10-03 MED ORDER — SODIUM CHLORIDE 0.9 % IJ SOLN
10.0000 mL | INTRAMUSCULAR | Status: DC | PRN
  Administered 2014-10-03: 10 mL
  Filled 2014-10-03: qty 10

## 2014-10-03 MED ORDER — HEPARIN SOD (PORK) LOCK FLUSH 100 UNIT/ML IV SOLN
500.0000 [IU] | Freq: Once | INTRAVENOUS | Status: AC | PRN
  Administered 2014-10-03: 500 [IU]
  Filled 2014-10-03: qty 5

## 2014-10-03 NOTE — Progress Notes (Signed)
Dr. Alvy Bimler notified of pt's [place] of 1.4. Okay to proceed with treatment today.

## 2014-10-03 NOTE — Patient Instructions (Signed)
Cetuximab injection What is this medicine? CETUXIMAB (se TUX i mab) is a chemotherapy drug. It targets a specific protein within cancer cells and stops the cells from growing. It is used to treat colorectal cancer and head and neck cancer. This medicine may be used for other purposes; ask your health care provider or pharmacist if you have questions. COMMON BRAND NAME(S): Erbitux What should I tell my health care provider before I take this medicine? They need to know if you have any of these conditions: -heart disease -history of irregular heartbeat -history of low levels of calcium, magnesium, or potassium in the blood -lung or breathing disease, like asthma -an unusual or allergic reaction to cetuximab, other medicines, foods, dyes, or preservatives -pregnant or trying to get pregnant -breast-feeding How should I use this medicine? This drug is given as an infusion into a vein. It is administered in a hospital or clinic by a specially trained health care professional. Talk to your pediatrician regarding the use of this medicine in children. Special care may be needed. Overdosage: If you think you have taken too much of this medicine contact a poison control center or emergency room at once. NOTE: This medicine is only for you. Do not share this medicine with others. What if I miss a dose? It is important not to miss your dose. Call your doctor or health care professional if you are unable to keep an appointment. What may interact with this medicine? Interactions are not expected. This list may not describe all possible interactions. Give your health care provider a list of all the medicines, herbs, non-prescription drugs, or dietary supplements you use. Also tell them if you smoke, drink alcohol, or use illegal drugs. Some items may interact with your medicine. What should I watch for while using this medicine? Visit your doctor or health care professional for regular checks on your  progress. This drug may make you feel generally unwell. This is not uncommon, as chemotherapy can affect healthy cells as well as cancer cells. Report any side effects. Continue your course of treatment even though you feel ill unless your doctor tells you to stop. This medicine can make you more sensitive to the sun. Keep out of the sun while taking this medicine and for 2 months after the last dose. If you cannot avoid being in the sun, wear protective clothing and use sunscreen. Do not use sun lamps or tanning beds/booths. You may need blood work done while you are taking this medicine. In some cases, you may be given additional medicines to help with side effects. Follow all directions for their use. Call your doctor or health care professional for advice if you get a fever, chills or sore throat, or other symptoms of a cold or flu. Do not treat yourself. This drug decreases your body's ability to fight infections. Try to avoid being around people who are sick. Avoid taking products that contain aspirin, acetaminophen, ibuprofen, naproxen, or ketoprofen unless instructed by your doctor. These medicines may hide a fever. Do not become pregnant while taking this medicine. Women should inform their doctor if they wish to become pregnant or think they might be pregnant. There is a potential for serious side effects to an unborn child. Use adequate birth control methods. Avoid pregnancy for at least 6 months after your last dose. Talk to your health care professional or pharmacist for more information. Do not breast-feed an infant while taking this medicine or during the 2 months after your last  dose. What side effects may I notice from receiving this medicine? Side effects that you should report to your doctor or health care professional as soon as possible: -allergic reactions like skin rash, itching or hives, swelling of the face, lips, or tongue -breathing problems -changes in vision -fast, irregular  heartbeat -feeling faint or lightheaded, falls -fever, chills -mouth sores -redness, blistering, peeling or loosening of the skin, including inside the mouth -trouble passing urine or change in the amount of urine -unusually weak or tired Side effects that usually do not require medical attention (report to your doctor or health care professional if they continue or are bothersome): -changes in skin like acne, cracks, skin dryness -constipation -diarrhea -headache -nail changes -nausea, vomiting -stomach upset -weight loss This list may not describe all possible side effects. Call your doctor for medical advice about side effects. You may report side effects to FDA at 1-800-FDA-1088. Where should I keep my medicine? This drug is given in a hospital or clinic and will not be stored at home. NOTE: This sheet is a summary. It may not cover all possible information. If you have questions about this medicine, talk to your doctor, pharmacist, or health care provider.  2015, [employer]. (2014-02-13 16:14:34)

## 2014-10-07 ENCOUNTER — Ambulatory Visit
Admission: RE | Admit: 2014-10-07 | Discharge: 2014-10-07 | Disposition: A | Discharge status: Home / Self Care | Payer: Medicare Other | Source: Ambulatory Visit | Attending: Family Medicine | Admitting: Family Medicine

## 2014-10-07 DIAGNOSIS — E2839 Other primary ovarian failure: Secondary | ICD-10-CM

## 2014-10-16 ENCOUNTER — Other Ambulatory Visit: Payer: Self-pay | Admitting: Hematology and Oncology

## 2014-10-16 DIAGNOSIS — C139 Malignant neoplasm of hypopharynx, unspecified: Secondary | ICD-10-CM

## 2014-10-17 ENCOUNTER — Ambulatory Visit (HOSPITAL_BASED_OUTPATIENT_CLINIC_OR_DEPARTMENT_OTHER): Payer: Medicare Other | Admitting: Hematology and Oncology

## 2014-10-17 ENCOUNTER — Other Ambulatory Visit (HOSPITAL_BASED_OUTPATIENT_CLINIC_OR_DEPARTMENT_OTHER): Payer: Medicare Other

## 2014-10-17 ENCOUNTER — Ambulatory Visit (HOSPITAL_BASED_OUTPATIENT_CLINIC_OR_DEPARTMENT_OTHER): Payer: Medicare Other

## 2014-10-17 ENCOUNTER — Other Ambulatory Visit: Payer: Self-pay

## 2014-10-17 ENCOUNTER — Telehealth: Payer: Self-pay | Admitting: Hematology and Oncology

## 2014-10-17 VITALS — BP 129/53 | HR 76 | Temp 97.5°F | Resp 18 | Ht 61.0 in | Wt 127.4 lb

## 2014-10-17 DIAGNOSIS — D6959 Other secondary thrombocytopenia: Secondary | ICD-10-CM

## 2014-10-17 DIAGNOSIS — C139 Malignant neoplasm of hypopharynx, unspecified: Secondary | ICD-10-CM

## 2014-10-17 DIAGNOSIS — T451X5A Adverse effect of antineoplastic and immunosuppressive drugs, initial encounter: Secondary | ICD-10-CM

## 2014-10-17 DIAGNOSIS — T50905A Adverse effect of unspecified drugs, medicaments and biological substances, initial encounter: Secondary | ICD-10-CM

## 2014-10-17 DIAGNOSIS — D630 Anemia in neoplastic disease: Secondary | ICD-10-CM

## 2014-10-17 DIAGNOSIS — Z5112 Encounter for antineoplastic immunotherapy: Secondary | ICD-10-CM

## 2014-10-17 DIAGNOSIS — Z5111 Encounter for antineoplastic chemotherapy: Secondary | ICD-10-CM

## 2014-10-17 DIAGNOSIS — D72819 Decreased white blood cell count, unspecified: Secondary | ICD-10-CM

## 2014-10-17 DIAGNOSIS — R21 Rash and other nonspecific skin eruption: Secondary | ICD-10-CM

## 2014-10-17 DIAGNOSIS — D701 Agranulocytosis secondary to cancer chemotherapy: Secondary | ICD-10-CM

## 2014-10-17 DIAGNOSIS — Z95828 Presence of other vascular implants and grafts: Secondary | ICD-10-CM

## 2014-10-17 LAB — CBC WITH DIFFERENTIAL/PLATELET
BASO%: 0.7 % (ref 0.0–2.0)
Basophils Absolute: 0 10*3/uL (ref 0.0–0.1)
EOS%: 5.1 % (ref 0.0–7.0)
Eosinophils Absolute: 0.1 10*3/uL (ref 0.0–0.5)
HEMATOCRIT: 34.9 % (ref 34.8–46.6)
HGB: 11.4 g/dL — ABNORMAL LOW (ref 11.6–15.9)
LYMPH#: 1.1 10*3/uL (ref 0.9–3.3)
LYMPH%: 39.2 % (ref 14.0–49.7)
MCH: 34.2 pg — ABNORMAL HIGH (ref 25.1–34.0)
MCHC: 32.7 g/dL (ref 31.5–36.0)
MCV: 104.8 fL — ABNORMAL HIGH (ref 79.5–101.0)
MONO#: 0.2 10*3/uL (ref 0.1–0.9)
MONO%: 7.3 % (ref 0.0–14.0)
NEUT#: 1.3 10*3/uL — ABNORMAL LOW (ref 1.5–6.5)
NEUT%: 47.7 % (ref 38.4–76.8)
Platelets: 118 10*3/uL — ABNORMAL LOW (ref 145–400)
RBC: 3.33 10*6/uL — AB (ref 3.70–5.45)
RDW: 16.4 % — AB (ref 11.2–14.5)
WBC: 2.7 10*3/uL — AB (ref 3.9–10.3)
nRBC: 0 % (ref 0–0)

## 2014-10-17 LAB — COMPREHENSIVE METABOLIC PANEL (CC13)
ALBUMIN: 3.7 g/dL (ref 3.5–5.0)
ALT: 13 U/L (ref 0–55)
AST: 17 U/L (ref 5–34)
Alkaline Phosphatase: 93 U/L (ref 40–150)
Anion Gap: 12 mEq/L — ABNORMAL HIGH (ref 3–11)
BUN: 9.4 mg/dL (ref 7.0–26.0)
CALCIUM: 9.2 mg/dL (ref 8.4–10.4)
CHLORIDE: 106 meq/L (ref 98–109)
CO2: 24 mEq/L (ref 22–29)
Creatinine: 0.8 mg/dL (ref 0.6–1.1)
EGFR: >90 mL/min/{1.73_m2} (ref >90)
Glucose: 118 mg/dl (ref 70–140)
POTASSIUM: 4 meq/L (ref 3.5–5.1)
SODIUM: 142 meq/L (ref 136–145)
Total Bilirubin: 0.28 mg/dL (ref 0.20–1.20)
Total Protein: 6.7 g/dL (ref 6.4–8.3)

## 2014-10-17 LAB — MAGNESIUM (CC13): MAGNESIUM: 2.3 mg/dL (ref 1.5–2.5)

## 2014-10-17 MED ORDER — DIPHENHYDRAMINE HCL 50 MG/ML IJ SOLN
INTRAMUSCULAR | Status: AC
  Filled 2014-10-17: qty 1

## 2014-10-17 MED ORDER — CETUXIMAB CHEMO IV INJECTION 200 MG/100ML
250.0000 mg/m2 | Freq: Once | INTRAVENOUS | Status: AC
  Administered 2014-10-17: 400 mg via INTRAVENOUS
  Filled 2014-10-17: qty 200

## 2014-10-17 MED ORDER — ONDANSETRON 16 MG/50ML IVPB (CHCC)
16.0000 mg | Freq: Once | INTRAVENOUS | Status: AC
  Administered 2014-10-17: 16 mg via INTRAVENOUS

## 2014-10-17 MED ORDER — DEXAMETHASONE SODIUM PHOSPHATE 20 MG/5ML IJ SOLN
INTRAMUSCULAR | Status: AC
  Filled 2014-10-17: qty 5

## 2014-10-17 MED ORDER — SODIUM CHLORIDE 0.9 % IV SOLN
390.0000 mg | Freq: Once | INTRAVENOUS | Status: AC
  Administered 2014-10-17: 390 mg via INTRAVENOUS
  Filled 2014-10-17: qty 39

## 2014-10-17 MED ORDER — SODIUM CHLORIDE 0.9 % IV SOLN
Freq: Once | INTRAVENOUS | Status: AC
  Administered 2014-10-17: 10:00:00 via INTRAVENOUS

## 2014-10-17 MED ORDER — SODIUM CHLORIDE 0.9 % IV SOLN
750.0000 mg/m2/d | INTRAVENOUS | Status: DC
  Administered 2014-10-17: 4750 mg via INTRAVENOUS
  Filled 2014-10-17: qty 95

## 2014-10-17 MED ORDER — SODIUM CHLORIDE 0.9 % IJ SOLN
10.0000 mL | INTRAMUSCULAR | Status: DC | PRN
  Administered 2014-10-17: 10 mL via INTRAVENOUS
  Filled 2014-10-17: qty 10

## 2014-10-17 MED ORDER — DIPHENHYDRAMINE HCL 50 MG/ML IJ SOLN
50.0000 mg | Freq: Once | INTRAMUSCULAR | Status: AC
  Administered 2014-10-17: 50 mg via INTRAVENOUS

## 2014-10-17 MED ORDER — ONDANSETRON 16 MG/50ML IVPB (CHCC)
INTRAVENOUS | Status: AC
  Filled 2014-10-17: qty 16

## 2014-10-17 MED ORDER — DEXAMETHASONE SODIUM PHOSPHATE 20 MG/5ML IJ SOLN
20.0000 mg | Freq: Once | INTRAMUSCULAR | Status: AC
  Administered 2014-10-17: 20 mg via INTRAVENOUS

## 2014-10-17 NOTE — Patient Instructions (Signed)

## 2014-10-17 NOTE — Progress Notes (Signed)
Reviewed labs, ok to treat today per Dr Alvy Bimler office visit note.

## 2014-10-17 NOTE — Telephone Encounter (Signed)
gv and printed appt sched and avs for pt for Dec...sed added tx.

## 2014-10-17 NOTE — Assessment & Plan Note (Signed)
This is likely due to recent treatment. The patient denies recent history of fevers, cough, chills, diarrhea or dysuria. She is asymptomatic from the leukopenia. I will observe for now.  I will continue the chemotherapy at current dose without dosage adjustment.  If the leukopenia gets progressive worse in the future, I might have to delay her treatment or adjust the chemotherapy dose. Since this would be her last cycle of treatment, I will omit Neulasta as she has been asymptomatic.

## 2014-10-17 NOTE — Assessment & Plan Note (Signed)

## 2014-10-17 NOTE — Progress Notes (Signed)
[facility] OFFICE PROGRESS NOTE  Patient Care Team: Baruch Goldmann, [place] as PCP - General (Physician Assistant) Thea Silversmith, MD (Radiation Oncology) Jerrell Belfast, MD (Otolaryngology) Heath Lark, MD as Consulting Physician (Hematology and Oncology) Brooks Sailors, RN as Oncology Nurse Navigator (Oncology)  SUMMARY OF ONCOLOGIC HISTORY: Oncology History   Cancer of hypopharynx, base of tongue, squamous cell carcinoma, HPV focally positive   Primary site: Pharynx - Hypopharynx   Staging method: AJCC 7th Edition   Clinical: Stage III (T2, N1, M0) signed by Thea Silversmith, MD on 10/07/2011 11:50 AM   Summary: Stage III (T2, N1, M0)       Cancer of hypopharynx   07/21/2011 Pathology Results Neck fine needle aspirate was negative for malignancy.   09/03/2011 Pathology Results Right neck fine needle aspirate and biopsy confirmed malignancy, squamous cell carcinoma, HPV marginally positive.   09/30/2011 Imaging PET CT scan confirmed right hypopharynx mass with regional lymphadenopathy.   10/28/2011 - 12/17/2011 Radiation Therapy The patient completed radiation therapy.   10/28/2011 - 12/23/2011 Chemotherapy The patient received 3 doses of high dose cisplatin. Her last dose of chemotherapy have 25% dose adjustment due to side effects.   03/16/2012 Imaging Repeat PET scan confirmed near complete response to treatment.   05/23/2012 Imaging Repeat PET/CT scan confirmed recurrence of disease in the right neck lymph node.   06/14/2012 Pathology Results Pathology confirmed only one lymph node involvement.   06/14/2012 Surgery The patient underwent right neck dissection for residual and recurrence of disease.   04/17/2014 Imaging Chest x-ray show multiple pulmonary nodules. CT scan of the neck showed no evidence of recurrence of disease.   04/18/2014 Imaging CT chest confirmed multiple pulmonary nodules highly suspicious for metastatic disease.   05/21/2014 Imaging PET CT scan showed multiple  pulmonary nodules which are hypermetabolic.   05/27/2014 Procedure [id number] CT-guided biopsy confirms malignant cells, compatible with squamous cell carcinoma.   06/13/2014 -  Chemotherapy She is started on palliative treatment with carboplatin, 5-FU and Erbitux   07/25/2014 Adverse Reaction Cycle 3 chemo is held and delayed due to leukopenia   08/20/2014 Imaging CT scan showed partial response to treatment.    INTERVAL HISTORY: Please see below for problem oriented charting. She is seen prior to cycle 6 of treatment. She has very mild faint rash on his skin, resolved with topical antibiotic cream. She denies any diarrhea. Appetite is good. She denies recent infection or cough.  REVIEW OF SYSTEMS:   Constitutional: Denies fevers, chills or abnormal weight loss Eyes: Denies blurriness of vision Ears, nose, mouth, throat, and face: Denies mucositis or sore throat Respiratory: Denies cough, dyspnea or wheezes Cardiovascular: Denies palpitation, chest discomfort or lower extremity swelling Gastrointestinal:  Denies nausea, heartburn or change in bowel habits Lymphatics: Denies new lymphadenopathy or easy bruising Neurological:Denies numbness, tingling or new weaknesses Behavioral/Psych: Mood is stable, no new changes  All other systems were reviewed with the patient and are negative.  I have reviewed the past medical history, past surgical history, social history and family history with the patient and they are unchanged from previous note.  ALLERGIES:  has No Known Allergies.  MEDICATIONS:  Current Outpatient Prescriptions  Medication Sig Dispense Refill  . Alum & Mag Hydroxide-Simeth (MAGIC MOUTHWASH) SOLN Take 5 mLs by mouth 4 (four) times daily as needed for mouth pain. 240 mL 2  . calcium carbonate (OS-CAL) 1250 MG chewable tablet Chew 1 tablet by mouth daily.     . Cholecalciferol (VITAMIN D) 2000  UNITS CAPS Take 2,000 Units by mouth daily.     . clindamycin (CLINDAGEL) 1 % gel  Apply topically 2 (two) times daily. 30 g 0  . docusate sodium (COLACE) 100 MG capsule Take 1 capsule (100 mg total) by mouth 2 (two) times daily as needed for mild constipation (keep stool soft). 60 capsule 3  . fexofenadine-pseudoephedrine (ALLEGRA-D 24) 180-240 MG per 24 hr tablet Take 1 tablet by mouth daily as needed (for allergies).     . hydrocortisone (ANUSOL-HC) 2.5 % rectal cream Place 1 application rectally 2 (two) times daily. Use on hemorrhoids as needed for one week.  Call MD if not better in one week. 30 g 0  . hydrocortisone 1 % ointment Apply 1 application topically 2 (two) times daily. 30 g 0  . lidocaine-prilocaine (EMLA) cream Apply 1 application topically as needed. 30 g 6  . Omega-3 Fatty Acids (OMEGA-3 FISH OIL PO) Take 1,280 mg by mouth daily.    Marland Kitchen omeprazole (PRILOSEC) 40 MG capsule Take 40 mg by mouth daily as needed (Acid reflux).     Marland Kitchen OVER THE COUNTER MEDICATION Take 1 tablet by mouth daily. MED NAME: Vitamin B-17    . oxyCODONE (OXY IR/ROXICODONE) 5 MG immediate release tablet Take 1 tablet (5 mg total) by mouth every 4 (four) hours as needed for severe pain. 30 tablet 0  . Polyethyl Glycol-Propyl Glycol (SYSTANE OP) Place 1 drop into both eyes 3 (three) times daily as needed (for allergies).     . pravastatin (PRAVACHOL) 40 MG tablet Take 40 mg by mouth daily.     No current facility-administered medications for this visit.    PHYSICAL EXAMINATION: ECOG PERFORMANCE STATUS: 0 - Asymptomatic  Filed Vitals:   10/17/14 0829  BP: 129/53  Pulse: 76  Temp: 97.5 F (36.4 C)  Resp: 18   Filed Weights   10/17/14 0829  Weight: 127 lb 6.4 oz (57.788 kg)    GENERAL:alert, no distress and comfortable SKIN: skin color, texture, turgor are normal, no rashes or significant lesions. Very mild faint acneform rash EYES: normal, Conjunctiva are pink and non-injected, sclera clear OROPHARYNX:no exudate, no erythema and lips, buccal mucosa, and tongue normal  NECK: supple,  thyroid normal size, non-tender, without nodularity LYMPH:  no palpable lymphadenopathy in the cervical, axillary or inguinal LUNGS: clear to auscultation and percussion with normal breathing effort HEART: regular rate & rhythm and no murmurs and no lower extremity edema ABDOMEN:abdomen soft, non-tender and normal bowel sounds Musculoskeletal:no cyanosis of digits and no clubbing  NEURO: alert & oriented x 3 with fluent speech, no focal motor/sensory deficits  LABORATORY DATA:  I have reviewed the data as listed    Component Value Date/Time   NA 139 10/03/2014 1301   NA 140 06/07/2012 1241   K 4.1 10/03/2014 1301   K 3.9 06/07/2012 1241   CL 103 04/17/2013 0803   CL 103 06/07/2012 1241   CO2 25 10/03/2014 1301   CO2 28 06/07/2012 1241   GLUCOSE 89 10/03/2014 1301   GLUCOSE 78 04/17/2013 0803   GLUCOSE 91 06/07/2012 1241   BUN 12.6 10/03/2014 1301   BUN 16 06/07/2012 1241   CREATININE 0.7 10/03/2014 1301   CREATININE 1.05 06/07/2012 1241   CALCIUM 9.4 10/03/2014 1301   CALCIUM 9.3 06/07/2012 1241   PROT 6.9 10/03/2014 1301   PROT 6.3 03/17/2012 0824   ALBUMIN 3.9 10/03/2014 1301   ALBUMIN 4.1 03/17/2012 0824   AST 17 10/03/2014 1301  AST 16 03/17/2012 0824   ALT 10 10/03/2014 1301   ALT <8 03/17/2012 0824   ALKPHOS 115 10/03/2014 1301   ALKPHOS 61 03/17/2012 0824   BILITOT 0.22 10/03/2014 1301   BILITOT 0.2* 03/17/2012 0824   GFRNONAA 55* 06/07/2012 1241   GFRAA 64* 06/07/2012 1241    No results found for: SPEP, UPEP  Lab Results  Component Value Date   WBC 2.7* 10/17/2014   NEUTROABS 1.3* 10/17/2014   HGB 11.4* 10/17/2014   HCT 34.9 10/17/2014   MCV 104.8* 10/17/2014   PLT 118* 10/17/2014      Chemistry      Component Value Date/Time   NA 139 10/03/2014 1301   NA 140 06/07/2012 1241   K 4.1 10/03/2014 1301   K 3.9 06/07/2012 1241   CL 103 04/17/2013 0803   CL 103 06/07/2012 1241   CO2 25 10/03/2014 1301   CO2 28 06/07/2012 1241   BUN 12.6  10/03/2014 1301   BUN 16 06/07/2012 1241   CREATININE 0.7 10/03/2014 1301   CREATININE 1.05 06/07/2012 1241      Component Value Date/Time   CALCIUM 9.4 10/03/2014 1301   CALCIUM 9.3 06/07/2012 1241   ALKPHOS 115 10/03/2014 1301   ALKPHOS 61 03/17/2012 0824   AST 17 10/03/2014 1301   AST 16 03/17/2012 0824   ALT 10 10/03/2014 1301   ALT <8 03/17/2012 0824   BILITOT 0.22 10/03/2014 1301   BILITOT 0.2* 03/17/2012 0824     ASSESSMENT & PLAN:  Cancer of hypopharynx She tolerated treatment well apart from minor side effects. I plan to give total of 6 cycles of chemotherapy before we repeat staging PET/CT scan. After that I plan for maintenance Erbitux in the future.     Anemia in neoplastic disease This is likely due to recent treatment. The patient denies recent history of bleeding such as epistaxis, hematuria or hematochezia. She is asymptomatic from the anemia. I will observe for now.  She does not require transfusion now. I will continue the chemotherapy at current dose without dosage adjustment.  If the anemia gets progressive worse in the future, I might have to delay her treatment or adjust the chemotherapy dose.  Leukopenia due to antineoplastic chemotherapy This is likely due to recent treatment. The patient denies recent history of fevers, cough, chills, diarrhea or dysuria. She is asymptomatic from the leukopenia. I will observe for now.  I will continue the chemotherapy at current dose without dosage adjustment.  If the leukopenia gets progressive worse in the future, I might have to delay her treatment or adjust the chemotherapy dose. Since this would be her last cycle of treatment, I will omit Neulasta as she has been asymptomatic.      Rash of face This is due to side effects of Erbitux. I recommend topical cream with hydrocortisone and clindamycin gel.  Thrombocytopenia due to drugs This is likely due to recent treatment. The patient denies recent history of  bleeding such as epistaxis, hematuria or hematochezia. She is asymptomatic from the low platelet count. I will observe for now.  she does not require transfusion now. I will continue the chemotherapy at current dose without dosage adjustment.  If the thrombocytopenia gets progressive worse in the future, I might have to delay her treatment or adjust the chemotherapy dose.   Orders Placed This Encounter  Procedures  . 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    Answer:  [facility]  . CBC with Differential    Standing Status: Future     Number of Occurrences:      Standing Expiration Date: 12/17/2015  . Comprehensive metabolic panel    Standing Status: Future     Number of Occurrences:      Standing Expiration Date: 12/17/2015  . Magnesium    Standing Status: Future     Number of Occurrences:      Standing Expiration Date: 12/17/2015   All questions were answered. The patient knows to call the clinic with any problems, questions or concerns. No barriers to learning was detected. I spent 30 minutes counseling the patient face to face. The total time spent in the appointment was 40 minutes and more than 50% was on counseling and review of test results     [facility], [facility], MD 10/17/2014 8:56 AM

## 2014-10-17 NOTE — Assessment & Plan Note (Signed)
She tolerated treatment well apart from minor side effects. I plan to give total of 6 cycles of chemotherapy before we repeat staging PET/CT scan. After that I plan for maintenance Erbitux in the future.

## 2014-10-17 NOTE — Assessment & Plan Note (Signed)
This is due to side effects of Erbitux. I recommend topical cream with hydrocortisone and clindamycin gel.

## 2014-10-17 NOTE — Patient Instructions (Signed)
[facility] Discharge Instructions for Patients Receiving Chemotherapy  Today you received the following chemotherapy agents erbitux/carboplatin/fluorouracil.    To help prevent nausea and vomiting after your treatment, we encourage you to take your nausea medication as directed   If you develop nausea and vomiting that is not controlled by your nausea medication, call the clinic.   BELOW ARE SYMPTOMS THAT SHOULD BE REPORTED IMMEDIATELY:  *FEVER GREATER THAN 100.5 F  *CHILLS WITH OR WITHOUT FEVER  NAUSEA AND VOMITING THAT IS NOT CONTROLLED WITH YOUR NAUSEA MEDICATION  *UNUSUAL SHORTNESS OF BREATH  *UNUSUAL BRUISING OR BLEEDING  TENDERNESS IN MOUTH AND THROAT WITH OR WITHOUT PRESENCE OF ULCERS  *URINARY PROBLEMS  *BOWEL PROBLEMS  UNUSUAL RASH Items with * indicate a potential emergency and should be followed up as soon as possible.  Feel free to call the clinic you have any questions or concerns. The clinic phone number is [phone].

## 2014-10-17 NOTE — Assessment & Plan Note (Signed)
This is likely due to recent treatment. The patient denies recent history of bleeding such as epistaxis, hematuria or hematochezia. She is asymptomatic from the low platelet count. I will observe for now.  she does not require transfusion now. I will continue the chemotherapy at current dose without dosage adjustment.  If the thrombocytopenia gets progressive worse in the future, I might have to delay her treatment or adjust the chemotherapy dose.   

## 2014-10-21 ENCOUNTER — Ambulatory Visit (HOSPITAL_BASED_OUTPATIENT_CLINIC_OR_DEPARTMENT_OTHER): Payer: Medicare Other

## 2014-10-21 DIAGNOSIS — C139 Malignant neoplasm of hypopharynx, unspecified: Secondary | ICD-10-CM

## 2014-10-21 MED ORDER — HEPARIN SOD (PORK) LOCK FLUSH 100 UNIT/ML IV SOLN
500.0000 [IU] | Freq: Once | INTRAVENOUS | Status: AC | PRN
  Administered 2014-10-21: 500 [IU]
  Filled 2014-10-21: qty 5

## 2014-10-21 MED ORDER — SODIUM CHLORIDE 0.9 % IJ SOLN
10.0000 mL | INTRAMUSCULAR | Status: DC | PRN
  Administered 2014-10-21: 10 mL
  Filled 2014-10-21: qty 10

## 2014-10-24 ENCOUNTER — Other Ambulatory Visit: Payer: Self-pay

## 2014-10-24 ENCOUNTER — Ambulatory Visit (HOSPITAL_BASED_OUTPATIENT_CLINIC_OR_DEPARTMENT_OTHER): Payer: Medicare Other

## 2014-10-24 DIAGNOSIS — C139 Malignant neoplasm of hypopharynx, unspecified: Secondary | ICD-10-CM

## 2014-10-24 DIAGNOSIS — Z5112 Encounter for antineoplastic immunotherapy: Secondary | ICD-10-CM

## 2014-10-24 MED ORDER — DIPHENHYDRAMINE HCL 50 MG/ML IJ SOLN
50.0000 mg | Freq: Once | INTRAMUSCULAR | Status: AC
  Administered 2014-10-24: 50 mg via INTRAVENOUS

## 2014-10-24 MED ORDER — SODIUM CHLORIDE 0.9 % IJ SOLN
10.0000 mL | INTRAMUSCULAR | Status: DC | PRN
  Administered 2014-10-24: 10 mL
  Filled 2014-10-24: qty 10

## 2014-10-24 MED ORDER — DIPHENHYDRAMINE HCL 50 MG/ML IJ SOLN
INTRAMUSCULAR | Status: AC
  Filled 2014-10-24: qty 1

## 2014-10-24 MED ORDER — SODIUM CHLORIDE 0.9 % IV SOLN
Freq: Once | INTRAVENOUS | Status: AC
  Administered 2014-10-24: 10:00:00 via INTRAVENOUS

## 2014-10-24 MED ORDER — HEPARIN SOD (PORK) LOCK FLUSH 100 UNIT/ML IV SOLN
500.0000 [IU] | Freq: Once | INTRAVENOUS | Status: AC | PRN
  Administered 2014-10-24: 500 [IU]
  Filled 2014-10-24: qty 5

## 2014-10-24 MED ORDER — CETUXIMAB CHEMO IV INJECTION 200 MG/100ML
250.0000 mg/m2 | Freq: Once | INTRAVENOUS | Status: AC
  Administered 2014-10-24: 400 mg via INTRAVENOUS
  Filled 2014-10-24: qty 200

## 2014-10-24 NOTE — Patient Instructions (Signed)
[facility] Discharge Instructions for Patients Receiving Chemotherapy  Today you received the following chemotherapy agents: Erbitux. To help prevent nausea and vomiting after your treatment, we encourage you to take your nausea medication.   If you develop nausea and vomiting that is not controlled by your nausea medication, call the clinic.   BELOW ARE SYMPTOMS THAT SHOULD BE REPORTED IMMEDIATELY:  *FEVER GREATER THAN 100.5 F  *CHILLS WITH OR WITHOUT FEVER  NAUSEA AND VOMITING THAT IS NOT CONTROLLED WITH YOUR NAUSEA MEDICATION  *UNUSUAL SHORTNESS OF BREATH  *UNUSUAL BRUISING OR BLEEDING  TENDERNESS IN MOUTH AND THROAT WITH OR WITHOUT PRESENCE OF ULCERS  *URINARY PROBLEMS  *BOWEL PROBLEMS  UNUSUAL RASH Items with * indicate a potential emergency and should be followed up as soon as possible.  Feel free to call the clinic you have any questions or concerns. The clinic phone number is [phone].

## 2014-10-24 NOTE — Progress Notes (Signed)
Pt observed for one hour post Erbitux infusion.

## 2014-10-31 ENCOUNTER — Ambulatory Visit (HOSPITAL_BASED_OUTPATIENT_CLINIC_OR_DEPARTMENT_OTHER): Payer: Medicare Other

## 2014-10-31 ENCOUNTER — Other Ambulatory Visit: Payer: Self-pay

## 2014-10-31 DIAGNOSIS — Z5112 Encounter for antineoplastic immunotherapy: Secondary | ICD-10-CM

## 2014-10-31 DIAGNOSIS — C139 Malignant neoplasm of hypopharynx, unspecified: Secondary | ICD-10-CM

## 2014-10-31 MED ORDER — SODIUM CHLORIDE 0.9 % IV SOLN
Freq: Once | INTRAVENOUS | Status: AC
  Administered 2014-10-31: 09:00:00 via INTRAVENOUS

## 2014-10-31 MED ORDER — CETUXIMAB CHEMO IV INJECTION 200 MG/100ML
250.0000 mg/m2 | Freq: Once | INTRAVENOUS | Status: AC
  Administered 2014-10-31: 400 mg via INTRAVENOUS
  Filled 2014-10-31: qty 200

## 2014-10-31 MED ORDER — DIPHENHYDRAMINE HCL 50 MG/ML IJ SOLN
INTRAMUSCULAR | Status: AC
  Filled 2014-10-31: qty 1

## 2014-10-31 MED ORDER — DIPHENHYDRAMINE HCL 50 MG/ML IJ SOLN
50.0000 mg | Freq: Once | INTRAMUSCULAR | Status: AC
  Administered 2014-10-31: 50 mg via INTRAVENOUS

## 2014-10-31 MED ORDER — SODIUM CHLORIDE 0.9 % IJ SOLN
10.0000 mL | INTRAMUSCULAR | Status: DC | PRN
  Administered 2014-10-31: 10 mL
  Filled 2014-10-31: qty 10

## 2014-10-31 MED ORDER — HEPARIN SOD (PORK) LOCK FLUSH 100 UNIT/ML IV SOLN
500.0000 [IU] | Freq: Once | INTRAVENOUS | Status: AC | PRN
  Administered 2014-10-31: 500 [IU]
  Filled 2014-10-31: qty 5

## 2014-10-31 NOTE — Patient Instructions (Signed)
[facility] Discharge Instructions for Patients Receiving Chemotherapy  Today you received the following chemotherapy agents: Erbitux  To help prevent nausea and vomiting after your treatment, we encourage you to take your nausea medication as prescribed by your physician.   If you develop nausea and vomiting that is not controlled by your nausea medication, call the clinic.   BELOW ARE SYMPTOMS THAT SHOULD BE REPORTED IMMEDIATELY:  *FEVER GREATER THAN 100.5 F  *CHILLS WITH OR WITHOUT FEVER  NAUSEA AND VOMITING THAT IS NOT CONTROLLED WITH YOUR NAUSEA MEDICATION  *UNUSUAL SHORTNESS OF BREATH  *UNUSUAL BRUISING OR BLEEDING  TENDERNESS IN MOUTH AND THROAT WITH OR WITHOUT PRESENCE OF ULCERS  *URINARY PROBLEMS  *BOWEL PROBLEMS  UNUSUAL RASH Items with * indicate a potential emergency and should be followed up as soon as possible.  Feel free to call the clinic you have any questions or concerns. The clinic phone number is [phone].

## 2014-10-31 NOTE — Progress Notes (Signed)
Patient discharged ambulatory, no acute distress after 30 min observation post erbitux.

## 2014-11-01 ENCOUNTER — Other Ambulatory Visit: Payer: Self-pay | Admitting: *Deleted

## 2014-11-01 DIAGNOSIS — C139 Malignant neoplasm of hypopharynx, unspecified: Secondary | ICD-10-CM

## 2014-11-01 MED ORDER — MAGIC MOUTHWASH: 5.0000 mL | Freq: Four times a day (QID) | ORAL | Status: DC | PRN

## 2014-11-01 MED ORDER — MAGIC MOUTHWASH: 5.0000 mL | Freq: Four times a day (QID) | ORAL | Status: AC | PRN

## 2014-11-01 NOTE — Telephone Encounter (Signed)
PRESCRIPTION CALLED TO PHARMACY.

## 2014-11-01 NOTE — Addendum Note (Signed)
Addended by: Wyonia Hough on: 11/01/2014 02:51 PM   Modules accepted: Orders

## 2014-11-13 ENCOUNTER — Ambulatory Visit (HOSPITAL_BASED_OUTPATIENT_CLINIC_OR_DEPARTMENT_OTHER): Payer: Medicare Other

## 2014-11-13 ENCOUNTER — Ambulatory Visit (HOSPITAL_COMMUNITY)
Admission: RE | Admit: 2014-11-13 | Discharge: 2014-11-13 | Disposition: A | Discharge status: Home / Self Care | Payer: Medicare Other | Source: Ambulatory Visit | Attending: Hematology and Oncology | Admitting: Hematology and Oncology

## 2014-11-13 ENCOUNTER — Other Ambulatory Visit (HOSPITAL_BASED_OUTPATIENT_CLINIC_OR_DEPARTMENT_OTHER): Payer: Medicare Other

## 2014-11-13 VITALS — BP 133/50 | HR 71 | Temp 97.9°F

## 2014-11-13 DIAGNOSIS — C329 Malignant neoplasm of larynx, unspecified: Secondary | ICD-10-CM | POA: Insufficient documentation

## 2014-11-13 DIAGNOSIS — Z95828 Presence of other vascular implants and grafts: Secondary | ICD-10-CM

## 2014-11-13 DIAGNOSIS — C799 Secondary malignant neoplasm of unspecified site: Secondary | ICD-10-CM | POA: Diagnosis present

## 2014-11-13 DIAGNOSIS — R918 Other nonspecific abnormal finding of lung field: Secondary | ICD-10-CM | POA: Insufficient documentation

## 2014-11-13 DIAGNOSIS — C139 Malignant neoplasm of hypopharynx, unspecified: Secondary | ICD-10-CM

## 2014-11-13 DIAGNOSIS — Z452 Encounter for adjustment and management of vascular access device: Secondary | ICD-10-CM

## 2014-11-13 LAB — COMPREHENSIVE METABOLIC PANEL (CC13)
ALBUMIN: 3.8 g/dL (ref 3.5–5.0)
ALK PHOS: 88 U/L (ref 40–150)
ALT: 12 U/L (ref 0–55)
AST: 18 U/L (ref 5–34)
Anion Gap: 8 mEq/L (ref 3–11)
BUN: 14.5 mg/dL (ref 7.0–26.0)
CO2: 26 mEq/L (ref 22–29)
Calcium: 9.2 mg/dL (ref 8.4–10.4)
Chloride: 108 mEq/L (ref 98–109)
Creatinine: 0.7 mg/dL (ref 0.6–1.1)
Glucose: 95 mg/dl (ref 70–140)
Potassium: 4 mEq/L (ref 3.5–5.1)
SODIUM: 142 meq/L (ref 136–145)
TOTAL PROTEIN: 6.7 g/dL (ref 6.4–8.3)
Total Bilirubin: 0.36 mg/dL (ref 0.20–1.20)

## 2014-11-13 LAB — CBC WITH DIFFERENTIAL/PLATELET
BASO%: 0.4 % (ref 0.0–2.0)
Basophils Absolute: 0 10*3/uL (ref 0.0–0.1)
EOS ABS: 0.1 10*3/uL (ref 0.0–0.5)
EOS%: 3.7 % (ref 0.0–7.0)
HCT: 34.1 % — ABNORMAL LOW (ref 34.8–46.6)
HGB: 11.1 g/dL — ABNORMAL LOW (ref 11.6–15.9)
LYMPH#: 1.1 10*3/uL (ref 0.9–3.3)
LYMPH%: 43.8 % (ref 14.0–49.7)
MCH: 33.9 pg (ref 25.1–34.0)
MCHC: 32.6 g/dL (ref 31.5–36.0)
MCV: 104.3 fL — ABNORMAL HIGH (ref 79.5–101.0)
MONO#: 0.2 10*3/uL (ref 0.1–0.9)
MONO%: 6.6 % (ref 0.0–14.0)
NEUT%: 45.5 % (ref 38.4–76.8)
NEUTROS ABS: 1.1 10*3/uL — AB (ref 1.5–6.5)
Platelets: 94 10*3/uL — ABNORMAL LOW (ref 145–400)
RBC: 3.27 10*6/uL — AB (ref 3.70–5.45)
RDW: 15 % — ABNORMAL HIGH (ref 11.2–14.5)
WBC: 2.4 10*3/uL — AB (ref 3.9–10.3)

## 2014-11-13 LAB — MAGNESIUM (CC13): Magnesium: 2.3 mg/dl (ref 1.5–2.5)

## 2014-11-13 LAB — GLUCOSE, CAPILLARY: GLUCOSE-CAPILLARY: 81 mg/dL (ref 70–99)

## 2014-11-13 MED ORDER — FLUDEOXYGLUCOSE F - 18 (FDG) INJECTION
6.8000 | Freq: Once | INTRAVENOUS | Status: AC | PRN
  Administered 2014-11-13: 6.8 via INTRAVENOUS

## 2014-11-13 MED ORDER — HEPARIN SOD (PORK) LOCK FLUSH 100 UNIT/ML IV SOLN
500.0000 [IU] | Freq: Once | INTRAVENOUS | Status: AC
  Administered 2014-11-13: 500 [IU] via INTRAVENOUS
  Filled 2014-11-13: qty 5

## 2014-11-13 MED ORDER — SODIUM CHLORIDE 0.9 % IJ SOLN
10.0000 mL | INTRAMUSCULAR | Status: DC | PRN
  Administered 2014-11-13: 10 mL via INTRAVENOUS
  Filled 2014-11-13: qty 10

## 2014-11-13 NOTE — Patient Instructions (Signed)

## 2014-11-14 ENCOUNTER — Ambulatory Visit (HOSPITAL_BASED_OUTPATIENT_CLINIC_OR_DEPARTMENT_OTHER): Payer: Medicare Other

## 2014-11-14 ENCOUNTER — Ambulatory Visit (HOSPITAL_BASED_OUTPATIENT_CLINIC_OR_DEPARTMENT_OTHER): Payer: Medicare Other | Admitting: Hematology and Oncology

## 2014-11-14 ENCOUNTER — Encounter: Payer: Self-pay | Admitting: *Deleted

## 2014-11-14 ENCOUNTER — Telehealth: Payer: Self-pay | Admitting: Hematology and Oncology

## 2014-11-14 VITALS — BP 135/65 | HR 80 | Temp 98.1°F | Resp 18 | Ht 61.0 in | Wt 130.0 lb

## 2014-11-14 DIAGNOSIS — D638 Anemia in other chronic diseases classified elsewhere: Secondary | ICD-10-CM

## 2014-11-14 DIAGNOSIS — C139 Malignant neoplasm of hypopharynx, unspecified: Secondary | ICD-10-CM

## 2014-11-14 DIAGNOSIS — D6959 Other secondary thrombocytopenia: Secondary | ICD-10-CM

## 2014-11-14 DIAGNOSIS — C770 Secondary and unspecified malignant neoplasm of lymph nodes of head, face and neck: Secondary | ICD-10-CM

## 2014-11-14 DIAGNOSIS — T451X5A Adverse effect of antineoplastic and immunosuppressive drugs, initial encounter: Secondary | ICD-10-CM

## 2014-11-14 DIAGNOSIS — T50905A Adverse effect of unspecified drugs, medicaments and biological substances, initial encounter: Secondary | ICD-10-CM

## 2014-11-14 DIAGNOSIS — D630 Anemia in neoplastic disease: Secondary | ICD-10-CM

## 2014-11-14 DIAGNOSIS — R21 Rash and other nonspecific skin eruption: Secondary | ICD-10-CM

## 2014-11-14 DIAGNOSIS — D701 Agranulocytosis secondary to cancer chemotherapy: Secondary | ICD-10-CM

## 2014-11-14 DIAGNOSIS — Z5112 Encounter for antineoplastic immunotherapy: Secondary | ICD-10-CM

## 2014-11-14 MED ORDER — DIPHENHYDRAMINE HCL 50 MG/ML IJ SOLN
INTRAMUSCULAR | Status: AC
  Filled 2014-11-14: qty 1

## 2014-11-14 MED ORDER — CLINDAMYCIN PHOSPHATE 1 % EX GEL
Freq: Two times a day (BID) | CUTANEOUS | Status: DC
Start: 2014-11-14 — End: 2015-04-29

## 2014-11-14 MED ORDER — SODIUM CHLORIDE 0.9 % IJ SOLN
10.0000 mL | INTRAMUSCULAR | Status: DC | PRN
  Administered 2014-11-14: 10 mL
  Filled 2014-11-14: qty 10

## 2014-11-14 MED ORDER — DIPHENHYDRAMINE HCL 50 MG/ML IJ SOLN
50.0000 mg | Freq: Once | INTRAMUSCULAR | Status: AC
  Administered 2014-11-14: 50 mg via INTRAVENOUS

## 2014-11-14 MED ORDER — HEPARIN SOD (PORK) LOCK FLUSH 100 UNIT/ML IV SOLN
500.0000 [IU] | Freq: Once | INTRAVENOUS | Status: AC | PRN
  Administered 2014-11-14: 500 [IU]
  Filled 2014-11-14: qty 5

## 2014-11-14 MED ORDER — CETUXIMAB CHEMO IV INJECTION 200 MG/100ML
500.0000 mg/m2 | Freq: Once | INTRAVENOUS | Status: AC
  Administered 2014-11-14: 800 mg via INTRAVENOUS
  Filled 2014-11-14: qty 400

## 2014-11-14 MED ORDER — SODIUM CHLORIDE 0.9 % IV SOLN
Freq: Once | INTRAVENOUS | Status: AC
  Administered 2014-11-14: 10:00:00 via INTRAVENOUS

## 2014-11-14 NOTE — Assessment & Plan Note (Signed)
This is likely due to recent treatment. The patient denies recent history of bleeding such as epistaxis, hematuria or hematochezia. She is asymptomatic from the anemia. I will observe for now.   

## 2014-11-14 NOTE — Telephone Encounter (Signed)
Gave avs & cal for Jan.

## 2014-11-14 NOTE — Assessment & Plan Note (Signed)
This is due to side effects of Erbitux. I recommend topical cream with clindamycin gel.

## 2014-11-14 NOTE — Progress Notes (Signed)
[facility] OFFICE PROGRESS NOTE  Patient Care Team: Baruch Goldmann, [place] as PCP - General (Physician Assistant) Thea Silversmith, MD (Radiation Oncology) Jerrell Belfast, MD (Otolaryngology) Heath Lark, MD as Consulting Physician (Hematology and Oncology) Brooks Sailors, RN as Oncology Nurse Navigator (Oncology)  SUMMARY OF ONCOLOGIC HISTORY: Oncology History   Cancer of hypopharynx, base of tongue, squamous cell carcinoma, HPV focally positive   Primary site: Pharynx - Hypopharynx   Staging method: AJCC 7th Edition   Clinical: Stage III (T2, N1, M0) signed by Thea Silversmith, MD on 10/07/2011 11:50 AM   Summary: Stage III (T2, N1, M0)       Cancer of hypopharynx   07/21/2011 Pathology Results Neck fine needle aspirate was negative for malignancy.   09/03/2011 Pathology Results Right neck fine needle aspirate and biopsy confirmed malignancy, squamous cell carcinoma, HPV marginally positive.   09/30/2011 Imaging PET CT scan confirmed right hypopharynx mass with regional lymphadenopathy.   10/28/2011 - 12/17/2011 Radiation Therapy The patient completed radiation therapy.   10/28/2011 - 12/23/2011 Chemotherapy The patient received 3 doses of high dose cisplatin. Her last dose of chemotherapy have 25% dose adjustment due to side effects.   03/16/2012 Imaging Repeat PET scan confirmed near complete response to treatment.   05/23/2012 Imaging Repeat PET/CT scan confirmed recurrence of disease in the right neck lymph node.   06/14/2012 Pathology Results Pathology confirmed only one lymph node involvement.   06/14/2012 Surgery The patient underwent right neck dissection for residual and recurrence of disease.   04/17/2014 Imaging Chest x-ray show multiple pulmonary nodules. CT scan of the neck showed no evidence of recurrence of disease.   04/18/2014 Imaging CT chest confirmed multiple pulmonary nodules highly suspicious for metastatic disease.   05/21/2014 Imaging PET CT scan showed multiple  pulmonary nodules which are hypermetabolic.   05/27/2014 Procedure [id number] CT-guided biopsy confirms malignant cells, compatible with squamous cell carcinoma.   06/13/2014 - 10/31/2014 Chemotherapy She completed 6 cycles of palliative treatment with carboplatin, 5-FU and Erbitux   07/25/2014 Adverse Reaction Cycle 3 chemo is held and delayed due to leukopenia   08/20/2014 Imaging CT scan showed partial response to treatment.   11/13/2014 Imaging PET CT scan showed stable disease   11/14/2014 -  Chemotherapy She is started on maintenance Erbitux every 2 weeks    INTERVAL HISTORY: Please see below for problem oriented charting. She is seen today to review test results. Sherry Craig back today, she feels fine.  REVIEW OF SYSTEMS:   Constitutional: Denies fevers, chills or abnormal weight loss Eyes: Denies blurriness of vision Ears, nose, mouth, throat, and face: Denies mucositis or sore throat Respiratory: Denies cough, dyspnea or wheezes Cardiovascular: Denies palpitation, chest discomfort or lower extremity swelling Gastrointestinal:  Denies nausea, heartburn or change in bowel habits Skin: Denies abnormal skin rashes Lymphatics: Denies new lymphadenopathy or easy bruising Neurological:Denies numbness, tingling or new weaknesses Behavioral/Psych: Mood is stable, no new changes  All other systems were reviewed with the patient and are negative.  I have reviewed the past medical history, past surgical history, social history and family history with the patient and they are unchanged from previous note.  ALLERGIES:  has No Known Allergies.  MEDICATIONS:  Current Outpatient Prescriptions  Medication Sig Dispense Refill  . Alum & Mag Hydroxide-Simeth (MAGIC MOUTHWASH) SOLN Take 5 mLs by mouth 4 (four) times daily as needed for mouth pain. 240 mL 0  . calcium carbonate (OS-CAL) 1250 MG chewable tablet Chew 1 tablet by mouth daily.     Marland Kitchen  Cholecalciferol (VITAMIN D) 2000 UNITS CAPS Take 2,000  Units by mouth daily.     . clindamycin (CLINDAGEL) 1 % gel Apply topically 2 (two) times daily. 60 g 3  . docusate sodium (COLACE) 100 MG capsule Take 1 capsule (100 mg total) by mouth 2 (two) times daily as needed for mild constipation (keep stool soft). 60 capsule 3  . fexofenadine-pseudoephedrine (ALLEGRA-D 24) 180-240 MG per 24 hr tablet Take 1 tablet by mouth daily as needed (for allergies).     . hydrocortisone (ANUSOL-HC) 2.5 % rectal cream Place 1 application rectally 2 (two) times daily. Use on hemorrhoids as needed for one week.  Call MD if not better in one week. 30 g 0  . hydrocortisone 1 % ointment Apply 1 application topically 2 (two) times daily. 30 g 0  . lidocaine-prilocaine (EMLA) cream Apply 1 application topically as needed. 30 g 6  . Omega-3 Fatty Acids (OMEGA-3 FISH OIL PO) Take 1,280 mg by mouth daily.    Marland Kitchen omeprazole (PRILOSEC) 40 MG capsule Take 40 mg by mouth daily as needed (Acid reflux).     Marland Kitchen OVER THE COUNTER MEDICATION Take 1 tablet by mouth daily. MED NAME: Vitamin B-17    . oxyCODONE (OXY IR/ROXICODONE) 5 MG immediate release tablet Take 1 tablet (5 mg total) by mouth every 4 (four) hours as needed for severe pain. 30 tablet 0  . Polyethyl Glycol-Propyl Glycol (SYSTANE OP) Place 1 drop into both eyes 3 (three) times daily as needed (for allergies).     . pravastatin (PRAVACHOL) 40 MG tablet Take 40 mg by mouth daily.     No current facility-administered medications for this visit.   Facility-Administered Medications Ordered in Other Visits  Medication Dose Route Frequency Provider Last Rate Last Dose  . cetuximab (ERBITUX) chemo infusion 800 mg  500 mg/m2 (Treatment Plan Actual) Intravenous Once Heath Lark, MD 400 mL/hr at 11/14/14 1029 800 mg at 11/14/14 1029  . heparin lock flush 100 unit/mL  500 Units Intracatheter Once PRN Heath Lark, MD      . sodium chloride 0.9 % injection 10 mL  10 mL Intracatheter PRN Heath Lark, MD        PHYSICAL EXAMINATION: ECOG  PERFORMANCE STATUS: 0 - Asymptomatic  Filed Vitals:   11/14/14 0834  BP: 135/65  Pulse: 80  Temp: 98.1 F (36.7 C)  Resp: 18   Filed Weights   11/14/14 0834  Weight: 130 lb (58.968 kg)    GENERAL:alert, no distress and comfortable SKIN: skin color, texture, turgor are normal, no rashes or significant lesions. Mild acne on the face EYES: normal, Conjunctiva are pink and non-injected, sclera clear Musculoskeletal:no cyanosis of digits and no clubbing  NEURO: alert & oriented x 3 with fluent speech, no focal motor/sensory deficits  LABORATORY DATA:  I have reviewed the data as listed    Component Value Date/Time   NA 142 11/13/2014 0845   NA 140 06/07/2012 1241   K 4.0 11/13/2014 0845   K 3.9 06/07/2012 1241   CL 103 04/17/2013 0803   CL 103 06/07/2012 1241   CO2 26 11/13/2014 0845   CO2 28 06/07/2012 1241   GLUCOSE 95 11/13/2014 0845   GLUCOSE 78 04/17/2013 0803   GLUCOSE 91 06/07/2012 1241   BUN 14.5 11/13/2014 0845   BUN 16 06/07/2012 1241   CREATININE 0.7 11/13/2014 0845   CREATININE 1.05 06/07/2012 1241   CALCIUM 9.2 11/13/2014 0845   CALCIUM 9.3 06/07/2012 1241   PROT  6.7 11/13/2014 0845   PROT 6.3 03/17/2012 0824   ALBUMIN 3.8 11/13/2014 0845   ALBUMIN 4.1 03/17/2012 0824   AST 18 11/13/2014 0845   AST 16 03/17/2012 0824   ALT 12 11/13/2014 0845   ALT <8 03/17/2012 0824   ALKPHOS 88 11/13/2014 0845   ALKPHOS 61 03/17/2012 0824   BILITOT 0.36 11/13/2014 0845   BILITOT 0.2* 03/17/2012 0824   GFRNONAA 55* 06/07/2012 1241   GFRAA 64* 06/07/2012 1241    No results found for: SPEP, UPEP  Lab Results  Component Value Date   WBC 2.4* 11/13/2014   NEUTROABS 1.1* 11/13/2014   HGB 11.1* 11/13/2014   HCT 34.1* 11/13/2014   MCV 104.3* 11/13/2014   PLT 94* 11/13/2014      Chemistry      Component Value Date/Time   NA 142 11/13/2014 0845   NA 140 06/07/2012 1241   K 4.0 11/13/2014 0845   K 3.9 06/07/2012 1241   CL 103 04/17/2013 0803   CL 103  06/07/2012 1241   CO2 26 11/13/2014 0845   CO2 28 06/07/2012 1241   BUN 14.5 11/13/2014 0845   BUN 16 06/07/2012 1241   CREATININE 0.7 11/13/2014 0845   CREATININE 1.05 06/07/2012 1241      Component Value Date/Time   CALCIUM 9.2 11/13/2014 0845   CALCIUM 9.3 06/07/2012 1241   ALKPHOS 88 11/13/2014 0845   ALKPHOS 61 03/17/2012 0824   AST 18 11/13/2014 0845   AST 16 03/17/2012 0824   ALT 12 11/13/2014 0845   ALT <8 03/17/2012 0824   BILITOT 0.36 11/13/2014 0845   BILITOT 0.2* 03/17/2012 0824      I reviewed the results with her and her husband RADIOGRAPHIC STUDIES: I have personally reviewed the radiological images as listed and agreed with the findings in the report. 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  EXAM: NUCLEAR MEDICINE PET SKULL BASE TO THIGH  TECHNIQUE: 6.8 mCi F-18 FDG was injected intravenously. Full-ring PET imaging was performed from the skull base to thigh after the radiotracer. CT data was obtained and used for attenuation correction and anatomic localization.  FASTING BLOOD GLUCOSE:  Value: 81 mg/dl  COMPARISON:  08/20/2014.  FINDINGS: NECK  No hypermetabolic lymph nodes in the neck.  CHEST  10 mm peripheral nodule in the posterior right lower lobe remains hypermetabolic with SUV max = 2.8 compared to 2.7 previously. Lesion measures 11 mm on today's study compared to 10 mm previously.  Index nodule in the left lower lobe measures 14 mm (image 81 series 4) today compared to 12 mm previously. SUV max = 3.4 on today's exam which is increased from 2.0 previously.  The 10 mm nodule in the right lung base, at the hemidiaphragmatic dome is stable a 10 mm. This nodule shows volume averaging with the adjacent liver parenchyma on fused imaging and reliable FDG uptake within this nodule is not possible on today's study.  Fairly diffuse uptake in the distal esophagus is probably  physiologic.  ABDOMEN/PELVIS  No abnormal hypermetabolic activity within the liver, pancreas, adrenal glands, or spleen. No hypermetabolic lymph nodes in the abdomen or pelvis.  SKELETON  No focal hypermetabolic activity to suggest skeletal metastasis. The somewhat diffuse osseous uptake seen on the previous study has decreased in the interval.  IMPRESSION: No substantial interval change in the bilateral hypermetabolic pulmonary metastases, based on either size or hypermetabolism.  One of the 3 nodules (left lower lobe) shows very minimal interval increase in size and hypermetabolism while the other 2 nodules are stable. No new or clearly progressive disease is identified in the chest.  No evidence for new hypermetabolic disease in the neck, abdomen, or pelvis.   Electronically Signed   By: Misty Stanley M.D.   On: 11/13/2014 13:10     ASSESSMENT & PLAN:  Cancer of hypopharynx We reviewed scan results. She has persistent disease, stable. I plan to start maintenance Erbitux every 2 weeks and repeat PET/CT scan after 3 months, around March 2016 to monitor for signs of progression. She agreed to proceed  Anemia in neoplastic disease This is likely due to recent treatment. The patient denies recent history of bleeding such as epistaxis, hematuria or hematochezia. She is asymptomatic from the anemia. I will observe for now.   Leukopenia due to antineoplastic chemotherapy This is likely due to recent treatment. The patient denies recent history of fevers, cough, chills, diarrhea or dysuria. She is asymptomatic from the leukopenia. I will observe for now.      Rash of face This is due to side effects of Erbitux. I recommend topical cream with clindamycin gel.    Thrombocytopenia due to drugs This is likely due to recent treatment. The patient denies recent history of bleeding such as epistaxis, hematuria or hematochezia. She is asymptomatic from the low platelet count. I will observe for now.   No  orders of the defined types were placed in this encounter.   All questions were answered. The patient knows to call the clinic with any problems, questions or concerns. No barriers to learning was detected. I spent 30 minutes counseling the patient face to face. The total time spent in the appointment was 40 minutes and more than 50% was on counseling and review of test results     [facility], [place], MD 11/14/2014 10:48 AM

## 2014-11-14 NOTE — Assessment & Plan Note (Signed)
We reviewed scan results. She has persistent disease, stable. I plan to start maintenance Erbitux every 2 weeks and repeat PET/CT scan after 3 months, around March 2016 to monitor for signs of progression. She agreed to proceed

## 2014-11-14 NOTE — Assessment & Plan Note (Signed)
This is likely due to recent treatment. The patient denies recent history of bleeding such as epistaxis, hematuria or hematochezia. She is asymptomatic from the low platelet count. I will observe for now.  

## 2014-11-14 NOTE — Progress Notes (Signed)
To provide support and encouragement, care continuity and to assess for needs, met with patient and her husband during appt with Dr. Gorsuch.  Patient verbalized understanding of beginning a chemo maintenance phase for her cancer.  She understands she can contact me with questions/concerns.  Rick , RN, BSN, CHPN Head & Neck Oncology Navigator [facility] [place] [phone]   

## 2014-11-14 NOTE — Patient Instructions (Signed)
[facility] Discharge Instructions for Patients Receiving Chemotherapy  Today you received the following chemotherapy agents: Erbitux  To help prevent nausea and vomiting after your treatment, we encourage you to take your nausea medication as prescribed by your physician.   If you develop nausea and vomiting that is not controlled by your nausea medication, call the clinic.   BELOW ARE SYMPTOMS THAT SHOULD BE REPORTED IMMEDIATELY:  *FEVER GREATER THAN 100.5 F  *CHILLS WITH OR WITHOUT FEVER  NAUSEA AND VOMITING THAT IS NOT CONTROLLED WITH YOUR NAUSEA MEDICATION  *UNUSUAL SHORTNESS OF BREATH  *UNUSUAL BRUISING OR BLEEDING  TENDERNESS IN MOUTH AND THROAT WITH OR WITHOUT PRESENCE OF ULCERS  *URINARY PROBLEMS  *BOWEL PROBLEMS  UNUSUAL RASH Items with * indicate a potential emergency and should be followed up as soon as possible.  Feel free to call the clinic you have any questions or concerns. The clinic phone number is [phone].

## 2014-11-14 NOTE — Progress Notes (Signed)
Per Dr.Gorsuch, proceed with treatment today. MD aware of ANC 1.1 and Platelets of 94

## 2014-11-14 NOTE — Assessment & Plan Note (Signed)
This is likely due to recent treatment. The patient denies recent history of fevers, cough, chills, diarrhea or dysuria. She is asymptomatic from the leukopenia. I will observe for now.

## 2014-11-28 ENCOUNTER — Other Ambulatory Visit (HOSPITAL_BASED_OUTPATIENT_CLINIC_OR_DEPARTMENT_OTHER): Payer: Medicare Other

## 2014-11-28 ENCOUNTER — Other Ambulatory Visit: Payer: Self-pay | Admitting: Hematology and Oncology

## 2014-11-28 ENCOUNTER — Ambulatory Visit (HOSPITAL_BASED_OUTPATIENT_CLINIC_OR_DEPARTMENT_OTHER): Payer: Medicare Other

## 2014-11-28 ENCOUNTER — Encounter: Payer: Self-pay | Admitting: *Deleted

## 2014-11-28 DIAGNOSIS — D701 Agranulocytosis secondary to cancer chemotherapy: Secondary | ICD-10-CM

## 2014-11-28 DIAGNOSIS — C770 Secondary and unspecified malignant neoplasm of lymph nodes of head, face and neck: Secondary | ICD-10-CM

## 2014-11-28 DIAGNOSIS — C139 Malignant neoplasm of hypopharynx, unspecified: Secondary | ICD-10-CM

## 2014-11-28 DIAGNOSIS — Z5112 Encounter for antineoplastic immunotherapy: Secondary | ICD-10-CM

## 2014-11-28 DIAGNOSIS — T451X5A Adverse effect of antineoplastic and immunosuppressive drugs, initial encounter: Principal | ICD-10-CM

## 2014-11-28 LAB — CBC WITH DIFFERENTIAL/PLATELET
BASO%: 0.7 % (ref 0.0–2.0)
Basophils Absolute: 0 10*3/uL (ref 0.0–0.1)
EOS%: 4.6 % (ref 0.0–7.0)
Eosinophils Absolute: 0.1 10*3/uL (ref 0.0–0.5)
HCT: 36 % (ref 34.8–46.6)
HEMOGLOBIN: 11.6 g/dL (ref 11.6–15.9)
LYMPH%: 46.9 % (ref 14.0–49.7)
MCH: 33.6 pg (ref 25.1–34.0)
MCHC: 32.2 g/dL (ref 31.5–36.0)
MCV: 104.3 fL — AB (ref 79.5–101.0)
MONO#: 0.3 10*3/uL (ref 0.1–0.9)
MONO%: 8.1 % (ref 0.0–14.0)
NEUT%: 39.7 % (ref 38.4–76.8)
NEUTROS ABS: 1.2 10*3/uL — AB (ref 1.5–6.5)
PLATELETS: 194 10*3/uL (ref 145–400)
RBC: 3.45 10*6/uL — ABNORMAL LOW (ref 3.70–5.45)
RDW: 14.6 % — AB (ref 11.2–14.5)
WBC: 3.1 10*3/uL — ABNORMAL LOW (ref 3.9–10.3)
lymph#: 1.4 10*3/uL (ref 0.9–3.3)

## 2014-11-28 MED ORDER — SODIUM CHLORIDE 0.9 % IV SOLN
Freq: Once | INTRAVENOUS | Status: AC
  Administered 2014-11-28: 09:00:00 via INTRAVENOUS

## 2014-11-28 MED ORDER — DIPHENHYDRAMINE HCL 50 MG/ML IJ SOLN
INTRAMUSCULAR | Status: AC
  Filled 2014-11-28: qty 1

## 2014-11-28 MED ORDER — HEPARIN SOD (PORK) LOCK FLUSH 100 UNIT/ML IV SOLN
500.0000 [IU] | Freq: Once | INTRAVENOUS | Status: AC | PRN
  Administered 2014-11-28: 500 [IU]
  Filled 2014-11-28: qty 5

## 2014-11-28 MED ORDER — DIPHENHYDRAMINE HCL 50 MG/ML IJ SOLN
50.0000 mg | Freq: Once | INTRAMUSCULAR | Status: AC
  Administered 2014-11-28: 50 mg via INTRAVENOUS

## 2014-11-28 MED ORDER — SODIUM CHLORIDE 0.9 % IJ SOLN
10.0000 mL | INTRAMUSCULAR | Status: DC | PRN
  Administered 2014-11-28: 10 mL
  Filled 2014-11-28: qty 10

## 2014-11-28 MED ORDER — CETUXIMAB CHEMO IV INJECTION 200 MG/100ML
500.0000 mg/m2 | Freq: Once | INTRAVENOUS | Status: AC
  Administered 2014-11-28: 800 mg via INTRAVENOUS
  Filled 2014-11-28: qty 400

## 2014-11-28 NOTE — Patient Instructions (Signed)
[facility] Discharge Instructions for Patients Receiving Chemotherapy  Today you received the following chemotherapy agents erbitux  To help prevent nausea and vomiting after your treatment, we encourage you to take your nausea medication as directed   If you develop nausea and vomiting that is not controlled by your nausea medication, call the clinic.   BELOW ARE SYMPTOMS THAT SHOULD BE REPORTED IMMEDIATELY:  *FEVER GREATER THAN 100.5 F  *CHILLS WITH OR WITHOUT FEVER  NAUSEA AND VOMITING THAT IS NOT CONTROLLED WITH YOUR NAUSEA MEDICATION  *UNUSUAL SHORTNESS OF BREATH  *UNUSUAL BRUISING OR BLEEDING  TENDERNESS IN MOUTH AND THROAT WITH OR WITHOUT PRESENCE OF ULCERS  *URINARY PROBLEMS  *BOWEL PROBLEMS  UNUSUAL RASH Items with * indicate a potential emergency and should be followed up as soon as possible.  Feel free to call the clinic you have any questions or concerns. The clinic phone number is [phone].

## 2014-11-28 NOTE — Progress Notes (Signed)
To provide support and encouragement, care continuity and to assess for needs, met with patient after her infusion.  She denied any needs or concerns; I encouraged her to contact me if that changes before I see her next, she verbalized understanding.  Gayleen Orem, RN, BSN, [facility] [place] [phone]

## 2014-12-12 ENCOUNTER — Other Ambulatory Visit: Payer: Self-pay | Admitting: Hematology and Oncology

## 2014-12-12 ENCOUNTER — Telehealth: Payer: Self-pay | Admitting: Hematology and Oncology

## 2014-12-12 ENCOUNTER — Telehealth: Payer: Self-pay | Admitting: *Deleted

## 2014-12-12 ENCOUNTER — Encounter: Payer: Self-pay | Admitting: Hematology and Oncology

## 2014-12-12 ENCOUNTER — Ambulatory Visit (HOSPITAL_BASED_OUTPATIENT_CLINIC_OR_DEPARTMENT_OTHER): Payer: Medicare Other

## 2014-12-12 ENCOUNTER — Ambulatory Visit (HOSPITAL_BASED_OUTPATIENT_CLINIC_OR_DEPARTMENT_OTHER): Payer: Medicare Other | Admitting: Hematology and Oncology

## 2014-12-12 ENCOUNTER — Other Ambulatory Visit (HOSPITAL_BASED_OUTPATIENT_CLINIC_OR_DEPARTMENT_OTHER): Payer: Medicare Other

## 2014-12-12 VITALS — BP 128/67 | HR 83 | Temp 98.1°F | Resp 18 | Ht 61.0 in | Wt 134.5 lb

## 2014-12-12 DIAGNOSIS — R21 Rash and other nonspecific skin eruption: Secondary | ICD-10-CM

## 2014-12-12 DIAGNOSIS — C139 Malignant neoplasm of hypopharynx, unspecified: Secondary | ICD-10-CM

## 2014-12-12 DIAGNOSIS — C779 Secondary and unspecified malignant neoplasm of lymph node, unspecified: Secondary | ICD-10-CM

## 2014-12-12 DIAGNOSIS — Z5112 Encounter for antineoplastic immunotherapy: Secondary | ICD-10-CM

## 2014-12-12 LAB — COMPREHENSIVE METABOLIC PANEL (CC13)
ALT: 11 U/L (ref 0–55)
ANION GAP: 10 meq/L (ref 3–11)
AST: 17 U/L (ref 5–34)
Albumin: 3.7 g/dL (ref 3.5–5.0)
Alkaline Phosphatase: 94 U/L (ref 40–150)
BUN: 7.1 mg/dL (ref 7.0–26.0)
CHLORIDE: 105 meq/L (ref 98–109)
CO2: 26 meq/L (ref 22–29)
Calcium: 9.1 mg/dL (ref 8.4–10.4)
Creatinine: 0.7 mg/dL (ref 0.6–1.1)
EGFR: >90 mL/min/{1.73_m2} (ref >90)
Glucose: 93 mg/dl (ref 70–140)
Potassium: 4.2 mEq/L (ref 3.5–5.1)
Sodium: 141 mEq/L (ref 136–145)
Total Bilirubin: 0.38 mg/dL (ref 0.20–1.20)
Total Protein: 7 g/dL (ref 6.4–8.3)

## 2014-12-12 LAB — CBC WITH DIFFERENTIAL/PLATELET
BASO%: 0.2 % (ref 0.0–2.0)
Basophils Absolute: 0 10*3/uL (ref 0.0–0.1)
EOS%: 3 % (ref 0.0–7.0)
Eosinophils Absolute: 0.2 10*3/uL (ref 0.0–0.5)
HEMATOCRIT: 36 % (ref 34.8–46.6)
HEMOGLOBIN: 11.6 g/dL (ref 11.6–15.9)
LYMPH%: 29.7 % (ref 14.0–49.7)
MCH: 33.6 pg (ref 25.1–34.0)
MCHC: 32.2 g/dL (ref 31.5–36.0)
MCV: 104.3 fL — ABNORMAL HIGH (ref 79.5–101.0)
MONO#: 0.5 10*3/uL (ref 0.1–0.9)
MONO%: 8.6 % (ref 0.0–14.0)
NEUT#: 3.1 10*3/uL (ref 1.5–6.5)
NEUT%: 58.5 % (ref 38.4–76.8)
PLATELETS: 210 10*3/uL (ref 145–400)
RBC: 3.45 10*6/uL — ABNORMAL LOW (ref 3.70–5.45)
RDW: 14.2 % (ref 11.2–14.5)
WBC: 5.3 10*3/uL (ref 3.9–10.3)
lymph#: 1.6 10*3/uL (ref 0.9–3.3)

## 2014-12-12 LAB — MAGNESIUM (CC13): Magnesium: 2.3 mg/dl (ref 1.5–2.5)

## 2014-12-12 MED ORDER — SODIUM CHLORIDE 0.9 % IJ SOLN
10.0000 mL | INTRAMUSCULAR | Status: DC | PRN
  Administered 2014-12-12: 10 mL
  Filled 2014-12-12: qty 10

## 2014-12-12 MED ORDER — DIPHENHYDRAMINE HCL 50 MG/ML IJ SOLN
INTRAMUSCULAR | Status: AC
  Filled 2014-12-12: qty 1

## 2014-12-12 MED ORDER — DIPHENHYDRAMINE HCL 50 MG/ML IJ SOLN
50.0000 mg | Freq: Once | INTRAMUSCULAR | Status: AC
  Administered 2014-12-12: 50 mg via INTRAVENOUS

## 2014-12-12 MED ORDER — CETUXIMAB CHEMO IV INJECTION 200 MG/100ML
500.0000 mg/m2 | Freq: Once | INTRAVENOUS | Status: AC
  Administered 2014-12-12: 800 mg via INTRAVENOUS
  Filled 2014-12-12: qty 400

## 2014-12-12 MED ORDER — HEPARIN SOD (PORK) LOCK FLUSH 100 UNIT/ML IV SOLN
500.0000 [IU] | Freq: Once | INTRAVENOUS | Status: AC | PRN
  Administered 2014-12-12: 500 [IU]
  Filled 2014-12-12: qty 5

## 2014-12-12 MED ORDER — SODIUM CHLORIDE 0.9 % IV SOLN
Freq: Once | INTRAVENOUS | Status: AC
  Administered 2014-12-12: 10:00:00 via INTRAVENOUS

## 2014-12-12 NOTE — Telephone Encounter (Signed)
Gave avs & calendar for February. Sent message to schedule treatment. °

## 2014-12-12 NOTE — Progress Notes (Signed)
[facility] OFFICE PROGRESS NOTE  Patient Care Team: Baruch Goldmann, [place] as PCP - General (Physician Assistant) Thea Silversmith, MD (Radiation Oncology) Jerrell Belfast, MD (Otolaryngology) Heath Lark, MD as Consulting Physician (Hematology and Oncology) Brooks Sailors, RN as Oncology Nurse Navigator (Oncology)  SUMMARY OF ONCOLOGIC HISTORY: Oncology History   Cancer of hypopharynx, base of tongue, squamous cell carcinoma, HPV focally positive   Primary site: Pharynx - Hypopharynx   Staging method: AJCC 7th Edition   Clinical: Stage III (T2, N1, M0) signed by Thea Silversmith, MD on 10/07/2011 11:50 AM   Summary: Stage III (T2, N1, M0)       Cancer of hypopharynx   07/21/2011 Pathology Results Neck fine needle aspirate was negative for malignancy.   09/03/2011 Pathology Results Right neck fine needle aspirate and biopsy confirmed malignancy, squamous cell carcinoma, HPV marginally positive.   09/30/2011 Imaging PET CT scan confirmed right hypopharynx mass with regional lymphadenopathy.   10/28/2011 - 12/17/2011 Radiation Therapy The patient completed radiation therapy.   10/28/2011 - 12/23/2011 Chemotherapy The patient received 3 doses of high dose cisplatin. Her last dose of chemotherapy have 25% dose adjustment due to side effects.   03/16/2012 Imaging Repeat PET scan confirmed near complete response to treatment.   05/23/2012 Imaging Repeat PET/CT scan confirmed recurrence of disease in the right neck lymph node.   06/14/2012 Pathology Results Pathology confirmed only one lymph node involvement.   06/14/2012 Surgery The patient underwent right neck dissection for residual and recurrence of disease.   04/17/2014 Imaging Chest x-ray show multiple pulmonary nodules. CT scan of the neck showed no evidence of recurrence of disease.   04/18/2014 Imaging CT chest confirmed multiple pulmonary nodules highly suspicious for metastatic disease.   05/21/2014 Imaging PET CT scan showed multiple  pulmonary nodules which are hypermetabolic.   05/27/2014 Procedure [id number] CT-guided biopsy confirms malignant cells, compatible with squamous cell carcinoma.   06/13/2014 - 10/31/2014 Chemotherapy She completed 6 cycles of palliative treatment with carboplatin, 5-FU and Erbitux   07/25/2014 Adverse Reaction Cycle 3 chemo is held and delayed due to leukopenia   08/20/2014 Imaging CT scan showed partial response to treatment.   11/13/2014 Imaging PET CT scan showed stable disease   11/14/2014 -  Chemotherapy She is started on maintenance Erbitux every 2 weeks    INTERVAL HISTORY: Please see below for problem oriented charting. She is seen today prior to her maintenance treatment. She complained of facial rash. She denies any mucositis or diarrhea.  REVIEW OF SYSTEMS:   Constitutional: Denies fevers, chills or abnormal weight loss Eyes: Denies blurriness of vision Ears, nose, mouth, throat, and face: Denies mucositis or sore throat Respiratory: Denies cough, dyspnea or wheezes Cardiovascular: Denies palpitation, chest discomfort or lower extremity swelling Gastrointestinal:  Denies nausea, heartburn or change in bowel habits Lymphatics: Denies new lymphadenopathy or easy bruising Neurological:Denies numbness, tingling or new weaknesses Behavioral/Psych: Mood is stable, no new changes  All other systems were reviewed with the patient and are negative.  I have reviewed the past medical history, past surgical history, social history and family history with the patient and they are unchanged from previous note.  ALLERGIES:  has No Known Allergies.  MEDICATIONS:  Current Outpatient Prescriptions  Medication Sig Dispense Refill  . Alum & Mag Hydroxide-Simeth (MAGIC MOUTHWASH) SOLN Take 5 mLs by mouth 4 (four) times daily as needed for mouth pain. 240 mL 0  . calcium carbonate (OS-CAL) 1250 MG chewable tablet Chew 1 tablet by mouth  daily.     . Cholecalciferol (VITAMIN D) 2000 UNITS CAPS  Take 2,000 Units by mouth daily.     . clindamycin (CLINDAGEL) 1 % gel Apply topically 2 (two) times daily. 60 g 3  . docusate sodium (COLACE) 100 MG capsule Take 1 capsule (100 mg total) by mouth 2 (two) times daily as needed for mild constipation (keep stool soft). 60 capsule 3  . fexofenadine-pseudoephedrine (ALLEGRA-D 24) 180-240 MG per 24 hr tablet Take 1 tablet by mouth daily as needed (for allergies).     . hydrocortisone (ANUSOL-HC) 2.5 % rectal cream Place 1 application rectally 2 (two) times daily. Use on hemorrhoids as needed for one week.  Call MD if not better in one week. 30 g 0  . hydrocortisone 1 % ointment Apply 1 application topically 2 (two) times daily. 30 g 0  . lidocaine-prilocaine (EMLA) cream Apply 1 application topically as needed. 30 g 6  . Omega-3 Fatty Acids (OMEGA-3 FISH OIL PO) Take 1,280 mg by mouth daily.    Marland Kitchen omeprazole (PRILOSEC) 40 MG capsule Take 40 mg by mouth daily as needed (Acid reflux).     Marland Kitchen OVER THE COUNTER MEDICATION Take 1 tablet by mouth daily. MED NAME: Vitamin B-17    . oxyCODONE (OXY IR/ROXICODONE) 5 MG immediate release tablet Take 1 tablet (5 mg total) by mouth every 4 (four) hours as needed for severe pain. 30 tablet 0  . Polyethyl Glycol-Propyl Glycol (SYSTANE OP) Place 1 drop into both eyes 3 (three) times daily as needed (for allergies).     . pravastatin (PRAVACHOL) 40 MG tablet Take 40 mg by mouth daily.     No current facility-administered medications for this visit.   Facility-Administered Medications Ordered in Other Visits  Medication Dose Route Frequency Provider Last Rate Last Dose  . heparin lock flush 100 unit/mL  500 Units Intracatheter Once PRN Heath Lark, MD      . sodium chloride 0.9 % injection 10 mL  10 mL Intracatheter PRN Heath Lark, MD        PHYSICAL EXAMINATION: ECOG PERFORMANCE STATUS: 0 - Asymptomatic  Filed Vitals:   12/12/14 0841  BP: 128/67  Pulse: 83  Temp: 98.1 F (36.7 C)  Resp: 18   Filed Weights    12/12/14 0841  Weight: 134 lb 8 oz (61.009 kg)    GENERAL:alert, no distress and comfortable SKIN: skin color, texture, turgor are normal, no rashes or significant lesions. Note that acneform rash from side effects of treatment EYES: normal, Conjunctiva are pink and non-injected, sclera clear OROPHARYNX:no exudate, no erythema and lips, buccal mucosa, and tongue normal  NECK: supple, thyroid normal size, non-tender, without nodularity LYMPH:  no palpable lymphadenopathy in the cervical, axillary or inguinal LUNGS: clear to auscultation and percussion with normal breathing effort HEART: regular rate & rhythm and no murmurs and no lower extremity edema ABDOMEN:abdomen soft, non-tender and normal bowel sounds Musculoskeletal:no cyanosis of digits and no clubbing  NEURO: alert & oriented x 3 with fluent speech, no focal motor/sensory deficits  LABORATORY DATA:  I have reviewed the data as listed    Component Value Date/Time   NA 141 12/12/2014 0831   NA 140 06/07/2012 1241   K 4.2 12/12/2014 0831   K 3.9 06/07/2012 1241   CL 103 04/17/2013 0803   CL 103 06/07/2012 1241   CO2 26 12/12/2014 0831   CO2 28 06/07/2012 1241   GLUCOSE 93 12/12/2014 0831   GLUCOSE 78 04/17/2013 0803  GLUCOSE 91 06/07/2012 1241   BUN 7.1 12/12/2014 0831   BUN 16 06/07/2012 1241   CREATININE 0.7 12/12/2014 0831   CREATININE 1.05 06/07/2012 1241   CALCIUM 9.1 12/12/2014 0831   CALCIUM 9.3 06/07/2012 1241   PROT 7.0 12/12/2014 0831   PROT 6.3 03/17/2012 0824   ALBUMIN 3.7 12/12/2014 0831   ALBUMIN 4.1 03/17/2012 0824   AST 17 12/12/2014 0831   AST 16 03/17/2012 0824   ALT 11 12/12/2014 0831   ALT <8 03/17/2012 0824   ALKPHOS 94 12/12/2014 0831   ALKPHOS 61 03/17/2012 0824   BILITOT 0.38 12/12/2014 0831   BILITOT 0.2* 03/17/2012 0824   GFRNONAA 55* 06/07/2012 1241   GFRAA 64* 06/07/2012 1241    No results found for: SPEP, UPEP  Lab Results  Component Value Date   WBC 5.3 12/12/2014    NEUTROABS 3.1 12/12/2014   HGB 11.6 12/12/2014   HCT 36.0 12/12/2014   MCV 104.3* 12/12/2014   PLT 210 12/12/2014      Chemistry      Component Value Date/Time   NA 141 12/12/2014 0831   NA 140 06/07/2012 1241   K 4.2 12/12/2014 0831   K 3.9 06/07/2012 1241   CL 103 04/17/2013 0803   CL 103 06/07/2012 1241   CO2 26 12/12/2014 0831   CO2 28 06/07/2012 1241   BUN 7.1 12/12/2014 0831   BUN 16 06/07/2012 1241   CREATININE 0.7 12/12/2014 0831   CREATININE 1.05 06/07/2012 1241      Component Value Date/Time   CALCIUM 9.1 12/12/2014 0831   CALCIUM 9.3 06/07/2012 1241   ALKPHOS 94 12/12/2014 0831   ALKPHOS 61 03/17/2012 0824   AST 17 12/12/2014 0831   AST 16 03/17/2012 0824   ALT 11 12/12/2014 0831   ALT <8 03/17/2012 0824   BILITOT 0.38 12/12/2014 0831   BILITOT 0.2* 03/17/2012 0824      ASSESSMENT & PLAN:  Cancer of hypopharynx She is tolerating treatment well upon from minor expected side effects. I will continue maintenance treatment every other week. I will order repeat staging scan in March 2016 to assess response to treatment.   Rash of face This is due to side effects of Erbitux. I recommend topical cream with clindamycin gel.    Orders Placed This Encounter  Procedures  . CBC with Differential/Platelet    Standing Status: Future     Number of Occurrences:      Standing Expiration Date: 01/16/2016  . Comprehensive metabolic panel    Standing Status: Future     Number of Occurrences:      Standing Expiration Date: 01/16/2016  . Magnesium    Standing Status: Future     Number of Occurrences:      Standing Expiration Date: 01/16/2016   All questions were answered. The patient knows to call the clinic with any problems, questions or concerns. No barriers to learning was detected. I spent 25 minutes counseling the patient face to face. The total time spent in the appointment was 30 minutes and more than 50% was on counseling and review of test results      [facility], [place], MD 12/12/2014 12:23 PM

## 2014-12-12 NOTE — Telephone Encounter (Signed)
Per staff message and POF I have scheduled appts. Advised scheduler of appts. JMW  

## 2014-12-12 NOTE — Assessment & Plan Note (Signed)
She is tolerating treatment well upon from minor expected side effects. I will continue maintenance treatment every other week. I will order repeat staging scan in March 2016 to assess response to treatment.

## 2014-12-12 NOTE — Patient Instructions (Signed)
[facility] Discharge Instructions for Patients Receiving Chemotherapy  Today you received the following chemotherapy agents Erbitux  To help prevent nausea and vomiting after your treatment, we encourage you to take your nausea medication as needed   If you develop nausea and vomiting that is not controlled by your nausea medication, call the clinic.   BELOW ARE SYMPTOMS THAT SHOULD BE REPORTED IMMEDIATELY:  *FEVER GREATER THAN 100.5 F  *CHILLS WITH OR WITHOUT FEVER  NAUSEA AND VOMITING THAT IS NOT CONTROLLED WITH YOUR NAUSEA MEDICATION  *UNUSUAL SHORTNESS OF BREATH  *UNUSUAL BRUISING OR BLEEDING  TENDERNESS IN MOUTH AND THROAT WITH OR WITHOUT PRESENCE OF ULCERS  *URINARY PROBLEMS  *BOWEL PROBLEMS  UNUSUAL RASH Items with * indicate a potential emergency and should be followed up as soon as possible.  Feel free to call the clinic you have any questions or concerns. The clinic phone number is [phone].

## 2014-12-12 NOTE — Assessment & Plan Note (Signed)
This is due to side effects of Erbitux. I recommend topical cream with clindamycin gel.

## 2014-12-19 ENCOUNTER — Telehealth: Payer: Self-pay | Admitting: *Deleted

## 2014-12-19 NOTE — Telephone Encounter (Signed)
Yes ok 

## 2014-12-19 NOTE — Telephone Encounter (Signed)
PT.'S PRIMARY CARE PHYSICIAN HAS PRESCRIBED AMOXICILLIN 875MG  TWICE A DAY. IS THIS OK FOR PT. TO TAKE?

## 2014-12-19 NOTE — Telephone Encounter (Signed)
NOTIFIED PT. THAT DR.GORSUCH IS OK WITH PT. TAKING THE AMOXICILLIN.

## 2014-12-26 ENCOUNTER — Ambulatory Visit (HOSPITAL_BASED_OUTPATIENT_CLINIC_OR_DEPARTMENT_OTHER): Payer: Medicare Other

## 2014-12-26 DIAGNOSIS — C139 Malignant neoplasm of hypopharynx, unspecified: Secondary | ICD-10-CM

## 2014-12-26 DIAGNOSIS — Z5112 Encounter for antineoplastic immunotherapy: Secondary | ICD-10-CM

## 2014-12-26 MED ORDER — SODIUM CHLORIDE 0.9 % IJ SOLN
10.0000 mL | INTRAMUSCULAR | Status: DC | PRN
  Administered 2014-12-26: 10 mL
  Filled 2014-12-26: qty 10

## 2014-12-26 MED ORDER — DIPHENHYDRAMINE HCL 50 MG/ML IJ SOLN
INTRAMUSCULAR | Status: AC
  Filled 2014-12-26: qty 1

## 2014-12-26 MED ORDER — DIPHENHYDRAMINE HCL 50 MG/ML IJ SOLN
50.0000 mg | Freq: Once | INTRAMUSCULAR | Status: AC
  Administered 2014-12-26: 50 mg via INTRAVENOUS

## 2014-12-26 MED ORDER — HEPARIN SOD (PORK) LOCK FLUSH 100 UNIT/ML IV SOLN
500.0000 [IU] | Freq: Once | INTRAVENOUS | Status: AC | PRN
  Administered 2014-12-26: 500 [IU]
  Filled 2014-12-26: qty 5

## 2014-12-26 MED ORDER — CETUXIMAB CHEMO IV INJECTION 200 MG/100ML
500.0000 mg/m2 | Freq: Once | INTRAVENOUS | Status: AC
  Administered 2014-12-26: 800 mg via INTRAVENOUS
  Filled 2014-12-26: qty 400

## 2014-12-26 MED ORDER — SODIUM CHLORIDE 0.9 % IV SOLN
Freq: Once | INTRAVENOUS | Status: AC
  Administered 2014-12-26: 10:00:00 via INTRAVENOUS

## 2014-12-26 NOTE — Patient Instructions (Signed)
[facility] Discharge Instructions for Patients Receiving Chemotherapy  Today you received the following chemotherapy agents Erbitux  To help prevent nausea and vomiting after your treatment, we encourage you to take your nausea medication as directed/prescribed   If you develop nausea and vomiting that is not controlled by your nausea medication, call the clinic.   BELOW ARE SYMPTOMS THAT SHOULD BE REPORTED IMMEDIATELY:  *FEVER GREATER THAN 100.5 F  *CHILLS WITH OR WITHOUT FEVER  NAUSEA AND VOMITING THAT IS NOT CONTROLLED WITH YOUR NAUSEA MEDICATION  *UNUSUAL SHORTNESS OF BREATH  *UNUSUAL BRUISING OR BLEEDING  TENDERNESS IN MOUTH AND THROAT WITH OR WITHOUT PRESENCE OF ULCERS  *URINARY PROBLEMS  *BOWEL PROBLEMS  UNUSUAL RASH Items with * indicate a potential emergency and should be followed up as soon as possible.  Feel free to call the clinic you have any questions or concerns. The clinic phone number is [phone].

## 2015-01-07 ENCOUNTER — Telehealth: Payer: Self-pay | Admitting: *Deleted

## 2015-01-07 NOTE — Telephone Encounter (Signed)
Patient called to say she has had a sinus infection and has been on Augmentin 875mg  bid.  She is continuing to have a cough which is productive with thick secretions.  She reported that to her PCP, was prescibed a Z-pack to take for the next five days.  She wanted to know if she should be seen before 01/09/15 when she is scheduled to see Dr. Alvy Bimler.  Advised her to proceed with the Z-pack, check her temperature (although she says she does not feel feverish) and to call back if her temperature is greater than 100 degrees.  She denies feeling fatigued.  She has been taking Allegra D, which may thicken secretions.  Advised her to increase fluid intact and consider Mucinex to think secretions so that she can bring up mucous more easily.  She agree to that plan.

## 2015-01-09 ENCOUNTER — Other Ambulatory Visit (HOSPITAL_BASED_OUTPATIENT_CLINIC_OR_DEPARTMENT_OTHER): Payer: Medicare Other

## 2015-01-09 ENCOUNTER — Ambulatory Visit (HOSPITAL_BASED_OUTPATIENT_CLINIC_OR_DEPARTMENT_OTHER): Payer: Medicare Other | Admitting: Hematology and Oncology

## 2015-01-09 ENCOUNTER — Ambulatory Visit (HOSPITAL_BASED_OUTPATIENT_CLINIC_OR_DEPARTMENT_OTHER): Payer: Medicare Other

## 2015-01-09 ENCOUNTER — Telehealth: Payer: Self-pay | Admitting: Hematology and Oncology

## 2015-01-09 ENCOUNTER — Telehealth: Payer: Self-pay | Admitting: *Deleted

## 2015-01-09 ENCOUNTER — Encounter: Payer: Self-pay | Admitting: Hematology and Oncology

## 2015-01-09 VITALS — BP 128/69 | HR 72 | Temp 98.1°F | Resp 18 | Ht 61.0 in | Wt 134.2 lb

## 2015-01-09 DIAGNOSIS — C139 Malignant neoplasm of hypopharynx, unspecified: Secondary | ICD-10-CM

## 2015-01-09 DIAGNOSIS — R21 Rash and other nonspecific skin eruption: Secondary | ICD-10-CM

## 2015-01-09 DIAGNOSIS — R0989 Other specified symptoms and signs involving the circulatory and respiratory systems: Secondary | ICD-10-CM | POA: Insufficient documentation

## 2015-01-09 DIAGNOSIS — Z5112 Encounter for antineoplastic immunotherapy: Secondary | ICD-10-CM

## 2015-01-09 LAB — CBC WITH DIFFERENTIAL/PLATELET
BASO%: 0.7 % (ref 0.0–2.0)
Basophils Absolute: 0 10*3/uL (ref 0.0–0.1)
EOS%: 7.9 % — ABNORMAL HIGH (ref 0.0–7.0)
Eosinophils Absolute: 0.3 10*3/uL (ref 0.0–0.5)
HCT: 36.9 % (ref 34.8–46.6)
HGB: 11.8 g/dL (ref 11.6–15.9)
LYMPH%: 33.6 % (ref 14.0–49.7)
MCH: 32.4 pg (ref 25.1–34.0)
MCHC: 32 g/dL (ref 31.5–36.0)
MCV: 101.4 fL — ABNORMAL HIGH (ref 79.5–101.0)
MONO#: 0.3 10*3/uL (ref 0.1–0.9)
MONO%: 7.1 % (ref 0.0–14.0)
NEUT#: 2.1 10*3/uL (ref 1.5–6.5)
NEUT%: 50.7 % (ref 38.4–76.8)
Platelets: 217 10*3/uL (ref 145–400)
RBC: 3.64 10*6/uL — ABNORMAL LOW (ref 3.70–5.45)
RDW: 14.2 % (ref 11.2–14.5)
WBC: 4.2 10*3/uL (ref 3.9–10.3)
lymph#: 1.4 10*3/uL (ref 0.9–3.3)

## 2015-01-09 LAB — COMPREHENSIVE METABOLIC PANEL (CC13)
ALBUMIN: 3.9 g/dL (ref 3.5–5.0)
ALK PHOS: 93 U/L (ref 40–150)
ALT: 9 U/L (ref 0–55)
AST: 17 U/L (ref 5–34)
Anion Gap: 10 mEq/L (ref 3–11)
BILIRUBIN TOTAL: 0.24 mg/dL (ref 0.20–1.20)
BUN: 12.6 mg/dL (ref 7.0–26.0)
CO2: 27 mEq/L (ref 22–29)
CREATININE: 0.8 mg/dL (ref 0.6–1.1)
Calcium: 9.4 mg/dL (ref 8.4–10.4)
Chloride: 105 mEq/L (ref 98–109)
GLUCOSE: 90 mg/dL (ref 70–140)
POTASSIUM: 3.9 meq/L (ref 3.5–5.1)
Sodium: 141 mEq/L (ref 136–145)
Total Protein: 7.2 g/dL (ref 6.4–8.3)

## 2015-01-09 LAB — MAGNESIUM (CC13): Magnesium: 2.2 mg/dl (ref 1.5–2.5)

## 2015-01-09 MED ORDER — DIPHENHYDRAMINE HCL 50 MG/ML IJ SOLN
INTRAMUSCULAR | Status: AC
Start: 2015-01-09 — End: 2015-01-09
  Filled 2015-01-09: qty 1

## 2015-01-09 MED ORDER — SODIUM CHLORIDE 0.9 % IV SOLN
Freq: Once | INTRAVENOUS | Status: AC
  Administered 2015-01-09: 09:00:00 via INTRAVENOUS

## 2015-01-09 MED ORDER — SODIUM CHLORIDE 0.9 % IJ SOLN
10.0000 mL | INTRAMUSCULAR | Status: DC | PRN
  Administered 2015-01-09: 10 mL
  Filled 2015-01-09: qty 10

## 2015-01-09 MED ORDER — HEPARIN SOD (PORK) LOCK FLUSH 100 UNIT/ML IV SOLN
500.0000 [IU] | Freq: Once | INTRAVENOUS | Status: AC | PRN
  Administered 2015-01-09: 500 [IU]
  Filled 2015-01-09: qty 5

## 2015-01-09 MED ORDER — CETUXIMAB CHEMO IV INJECTION 200 MG/100ML
500.0000 mg/m2 | Freq: Once | INTRAVENOUS | Status: AC
  Administered 2015-01-09: 800 mg via INTRAVENOUS
  Filled 2015-01-09: qty 400

## 2015-01-09 MED ORDER — DIPHENHYDRAMINE HCL 50 MG/ML IJ SOLN
50.0000 mg | Freq: Once | INTRAMUSCULAR | Status: AC
  Administered 2015-01-09: 50 mg via INTRAVENOUS

## 2015-01-09 NOTE — Patient Instructions (Signed)
[facility] Discharge Instructions for Patients Receiving Chemotherapy  Today you received the following chemotherapy agent: Erbitux   To help prevent nausea and vomiting after your treatment, we encourage you to take your nausea medication as prescribed.    If you develop nausea and vomiting that is not controlled by your nausea medication, call the clinic.   BELOW ARE SYMPTOMS THAT SHOULD BE REPORTED IMMEDIATELY:  *FEVER GREATER THAN 100.5 F  *CHILLS WITH OR WITHOUT FEVER  NAUSEA AND VOMITING THAT IS NOT CONTROLLED WITH YOUR NAUSEA MEDICATION  *UNUSUAL SHORTNESS OF BREATH  *UNUSUAL BRUISING OR BLEEDING  TENDERNESS IN MOUTH AND THROAT WITH OR WITHOUT PRESENCE OF ULCERS  *URINARY PROBLEMS  *BOWEL PROBLEMS  UNUSUAL RASH Items with * indicate a potential emergency and should be followed up as soon as possible.  Feel free to call the clinic you have any questions or concerns. The clinic phone number is [phone].

## 2015-01-09 NOTE — Assessment & Plan Note (Signed)
This is due to side effects of Erbitux. I recommend topical cream with clindamycin gel.

## 2015-01-09 NOTE — Assessment & Plan Note (Signed)
She has symptoms of nasal drainage and cough but overall, I do not feel that this is related to active infection. Her primary care provider has recently prescribed oral antibiotics and she is completing a course of this. I recommend she increase netty pot use and over-the-counter nasal decongestion.

## 2015-01-09 NOTE — Progress Notes (Signed)
[facility] OFFICE PROGRESS NOTE  Patient Care Team: Baruch Goldmann, [place] as PCP - General (Physician Assistant) Thea Silversmith, MD (Radiation Oncology) Jerrell Belfast, MD (Otolaryngology) Heath Lark, MD as Consulting Physician (Hematology and Oncology) Brooks Sailors, RN as Oncology Nurse Navigator (Oncology)  SUMMARY OF ONCOLOGIC HISTORY: Oncology History   Cancer of hypopharynx, base of tongue, squamous cell carcinoma, HPV focally positive   Primary site: Pharynx - Hypopharynx   Staging method: AJCC 7th Edition   Clinical: Stage III (T2, N1, M0) signed by Thea Silversmith, MD on 10/07/2011 11:50 AM   Summary: Stage III (T2, N1, M0)       Cancer of hypopharynx   07/21/2011 Pathology Results Neck fine needle aspirate was negative for malignancy.   09/03/2011 Pathology Results Right neck fine needle aspirate and biopsy confirmed malignancy, squamous cell carcinoma, HPV marginally positive.   09/30/2011 Imaging PET CT scan confirmed right hypopharynx mass with regional lymphadenopathy.   10/28/2011 - 12/17/2011 Radiation Therapy The patient completed radiation therapy.   10/28/2011 - 12/23/2011 Chemotherapy The patient received 3 doses of high dose cisplatin. Her last dose of chemotherapy have 25% dose adjustment due to side effects.   03/16/2012 Imaging Repeat PET scan confirmed near complete response to treatment.   05/23/2012 Imaging Repeat PET/CT scan confirmed recurrence of disease in the right neck lymph node.   06/14/2012 Pathology Results Pathology confirmed only one lymph node involvement.   06/14/2012 Surgery The patient underwent right neck dissection for residual and recurrence of disease.   04/17/2014 Imaging Chest x-ray show multiple pulmonary nodules. CT scan of the neck showed no evidence of recurrence of disease.   04/18/2014 Imaging CT chest confirmed multiple pulmonary nodules highly suspicious for metastatic disease.   05/21/2014 Imaging PET CT scan showed multiple  pulmonary nodules which are hypermetabolic.   05/27/2014 Procedure [id number] CT-guided biopsy confirms malignant cells, compatible with squamous cell carcinoma.   06/13/2014 - 10/31/2014 Chemotherapy She completed 6 cycles of palliative treatment with carboplatin, 5-FU and Erbitux   07/25/2014 Adverse Reaction Cycle 3 chemo is held and delayed due to leukopenia   08/20/2014 Imaging CT scan showed partial response to treatment.   11/13/2014 Imaging PET CT scan showed stable disease   11/14/2014 -  Chemotherapy She is started on maintenance Erbitux every 2 weeks    INTERVAL HISTORY: Please see below for problem oriented charting. She is seen prior to treatment today. She have recurrent upper respiratory tract infection and was prescribed antibiotic. She have symptoms of congestion and nasal drainage. Denies sore throat. She had mild cough which is nonproductive. Her acne has improved while on antibiotics.  REVIEW OF SYSTEMS:   Constitutional: Denies fevers, chills or abnormal weight loss Eyes: Denies blurriness of vision Ears, nose, mouth, throat, and face: Denies mucositis or sore throat Cardiovascular: Denies palpitation, chest discomfort or lower extremity swelling Gastrointestinal:  Denies nausea, heartburn or change in bowel habits Lymphatics: Denies new lymphadenopathy or easy bruising Neurological:Denies numbness, tingling or new weaknesses Behavioral/Psych: Mood is stable, no new changes  All other systems were reviewed with the patient and are negative.  I have reviewed the past medical history, past surgical history, social history and family history with the patient and they are unchanged from previous note.  ALLERGIES:  has No Known Allergies.  MEDICATIONS:  Current Outpatient Prescriptions  Medication Sig Dispense Refill  . calcium carbonate (OS-CAL) 1250 MG chewable tablet Chew 1 tablet by mouth daily.     . Cholecalciferol (VITAMIN D)  2000 UNITS CAPS Take 2,000 Units by  mouth daily.     Marland Kitchen docusate sodium (COLACE) 100 MG capsule Take 1 capsule (100 mg total) by mouth 2 (two) times daily as needed for mild constipation (keep stool soft). 60 capsule 3  . hydrocortisone 1 % ointment Apply 1 application topically 2 (two) times daily. 30 g 0  . lidocaine-prilocaine (EMLA) cream Apply 1 application topically as needed. 30 g 6  . Omega-3 Fatty Acids (OMEGA-3 FISH OIL PO) Take 1,280 mg by mouth daily.    Marland Kitchen omeprazole (PRILOSEC) 40 MG capsule Take 40 mg by mouth daily as needed (Acid reflux).     Marland Kitchen OVER THE COUNTER MEDICATION Take 1 tablet by mouth daily. MED NAME: Vitamin B-17    . Polyethyl Glycol-Propyl Glycol (SYSTANE OP) Place 1 drop into both eyes 3 (three) times daily as needed (for allergies).     . pravastatin (PRAVACHOL) 40 MG tablet Take 40 mg by mouth daily.    . Alum & Mag Hydroxide-Simeth (MAGIC MOUTHWASH) SOLN Take 5 mLs by mouth 4 (four) times daily as needed for mouth pain. (Patient not taking: Reported on 01/09/2015) 240 mL 0  . clindamycin (CLINDAGEL) 1 % gel Apply topically 2 (two) times daily. (Patient not taking: Reported on 01/09/2015) 60 g 3  . fexofenadine-pseudoephedrine (ALLEGRA-D 24) 180-240 MG per 24 hr tablet Take 1 tablet by mouth daily as needed (for allergies).     . hydrocortisone (ANUSOL-HC) 2.5 % rectal cream Place 1 application rectally 2 (two) times daily. Use on hemorrhoids as needed for one week.  Call MD if not better in one week. (Patient not taking: Reported on 01/09/2015) 30 g 0  . oxyCODONE (OXY IR/ROXICODONE) 5 MG immediate release tablet Take 1 tablet (5 mg total) by mouth every 4 (four) hours as needed for severe pain. (Patient not taking: Reported on 01/09/2015) 30 tablet 0   No current facility-administered medications for this visit.    PHYSICAL EXAMINATION: ECOG PERFORMANCE STATUS: 1 - Symptomatic but completely ambulatory  Filed Vitals:   01/09/15 0835  BP: 128/69  Pulse: 72  Temp: 98.1 F (36.7 C)  Resp: 18    Filed Weights   01/09/15 0835  Weight: 134 lb 3.2 oz (60.873 kg)    GENERAL:alert, no distress and comfortable SKIN:mild acne EYES: normal, Conjunctiva are pink and non-injected, sclera clear OROPHARYNX:no exudate, no erythema and lips, buccal mucosa, and tongue normal  NECK: the neck is thick from prior surgery and fibrosis LYMPH:  no palpable lymphadenopathy in the cervical, axillary or inguinal LUNGS: clear to auscultation and percussion with normal breathing effort HEART: regular rate & rhythm and no murmurs and no lower extremity edema ABDOMEN:abdomen soft, non-tender and normal bowel sounds Musculoskeletal:no cyanosis of digits and no clubbing  NEURO: alert & oriented x 3 with fluent speech, no focal motor/sensory deficits  LABORATORY DATA:  I have reviewed the data as listed    Component Value Date/Time   NA 141 12/12/2014 0831   NA 140 06/07/2012 1241   K 4.2 12/12/2014 0831   K 3.9 06/07/2012 1241   CL 103 04/17/2013 0803   CL 103 06/07/2012 1241   CO2 26 12/12/2014 0831   CO2 28 06/07/2012 1241   GLUCOSE 93 12/12/2014 0831   GLUCOSE 78 04/17/2013 0803   GLUCOSE 91 06/07/2012 1241   BUN 7.1 12/12/2014 0831   BUN 16 06/07/2012 1241   CREATININE 0.7 12/12/2014 0831   CREATININE 1.05 06/07/2012 1241   CALCIUM  9.1 12/12/2014 0831   CALCIUM 9.3 06/07/2012 1241   PROT 7.0 12/12/2014 0831   PROT 6.3 03/17/2012 0824   ALBUMIN 3.7 12/12/2014 0831   ALBUMIN 4.1 03/17/2012 0824   AST 17 12/12/2014 0831   AST 16 03/17/2012 0824   ALT 11 12/12/2014 0831   ALT <8 03/17/2012 0824   ALKPHOS 94 12/12/2014 0831   ALKPHOS 61 03/17/2012 0824   BILITOT 0.38 12/12/2014 0831   BILITOT 0.2* 03/17/2012 0824   GFRNONAA 55* 06/07/2012 1241   GFRAA 64* 06/07/2012 1241    No results found for: SPEP, UPEP  Lab Results  Component Value Date   WBC 4.2 01/09/2015   NEUTROABS 2.1 01/09/2015   HGB 11.8 01/09/2015   HCT 36.9 01/09/2015   MCV 101.4* 01/09/2015   PLT 217  01/09/2015      Chemistry      Component Value Date/Time   NA 141 12/12/2014 0831   NA 140 06/07/2012 1241   K 4.2 12/12/2014 0831   K 3.9 06/07/2012 1241   CL 103 04/17/2013 0803   CL 103 06/07/2012 1241   CO2 26 12/12/2014 0831   CO2 28 06/07/2012 1241   BUN 7.1 12/12/2014 0831   BUN 16 06/07/2012 1241   CREATININE 0.7 12/12/2014 0831   CREATININE 1.05 06/07/2012 1241      Component Value Date/Time   CALCIUM 9.1 12/12/2014 0831   CALCIUM 9.3 06/07/2012 1241   ALKPHOS 94 12/12/2014 0831   ALKPHOS 61 03/17/2012 0824   AST 17 12/12/2014 0831   AST 16 03/17/2012 0824   ALT 11 12/12/2014 0831   ALT <8 03/17/2012 0824   BILITOT 0.38 12/12/2014 0831   BILITOT 0.2* 03/17/2012 0824     ASSESSMENT & PLAN:  Cancer of hypopharynx She is tolerating treatment well upon from minor expected side effects. I will continue maintenance treatment every other week. I will order repeat staging scan in March 2016 to assess response to treatment.   Rash of face This is due to side effects of Erbitux. I recommend topical cream with clindamycin gel.   Upper respiratory symptom She has symptoms of nasal drainage and cough but overall, I do not feel that this is related to active infection. Her primary care provider has recently prescribed oral antibiotics and she is completing a course of this. I recommend she increase netty pot use and over-the-counter nasal decongestion.    Orders Placed This Encounter  Procedures  . NM PET Image Restag (PS) Skull Base To Thigh    Standing Status: Future     Number of Occurrences:      Standing Expiration Date: 03/10/2016    Order Specific Question:  Reason for Exam (SYMPTOM  OR DIAGNOSIS REQUIRED)    Answer:  staging metastatic oropharyngeal ca, assess response to Rx    Order Specific Question:  Preferred imaging location?    Answer:  [facility]   All questions were answered. The patient knows to call the clinic with any problems,  questions or concerns. No barriers to learning was detected. I spent 25 minutes counseling the patient face to face. The total time spent in the appointment was 30 minutes and more than 50% was on counseling and review of test results     [facility], [place], MD 01/09/2015 8:57 AM

## 2015-01-09 NOTE — Telephone Encounter (Signed)
Per staff message and POF I have scheduled appts. Advised scheduler of appts. JMW  

## 2015-01-09 NOTE — Telephone Encounter (Signed)
Gave avs & calendar for March. Sent message to schedule treatment.

## 2015-01-09 NOTE — Assessment & Plan Note (Signed)
She is tolerating treatment well upon from minor expected side effects. I will continue maintenance treatment every other week. I will order repeat staging scan in March 2016 to assess response to treatment.

## 2015-01-23 ENCOUNTER — Encounter: Payer: Self-pay | Admitting: *Deleted

## 2015-01-23 ENCOUNTER — Ambulatory Visit (HOSPITAL_BASED_OUTPATIENT_CLINIC_OR_DEPARTMENT_OTHER): Payer: Medicare Other

## 2015-01-23 ENCOUNTER — Other Ambulatory Visit: Payer: Self-pay | Admitting: *Deleted

## 2015-01-23 DIAGNOSIS — Z5112 Encounter for antineoplastic immunotherapy: Secondary | ICD-10-CM

## 2015-01-23 DIAGNOSIS — C139 Malignant neoplasm of hypopharynx, unspecified: Secondary | ICD-10-CM

## 2015-01-23 MED ORDER — CETUXIMAB CHEMO IV INJECTION 200 MG/100ML
500.0000 mg/m2 | Freq: Once | INTRAVENOUS | Status: AC
  Administered 2015-01-23: 800 mg via INTRAVENOUS
  Filled 2015-01-23: qty 400

## 2015-01-23 MED ORDER — SODIUM CHLORIDE 0.9 % IV SOLN
Freq: Once | INTRAVENOUS | Status: AC
  Administered 2015-01-23: 09:00:00 via INTRAVENOUS

## 2015-01-23 MED ORDER — SODIUM CHLORIDE 0.9 % IJ SOLN
10.0000 mL | INTRAMUSCULAR | Status: DC | PRN
  Administered 2015-01-23: 10 mL
  Filled 2015-01-23: qty 10

## 2015-01-23 MED ORDER — DIPHENHYDRAMINE HCL 50 MG/ML IJ SOLN
INTRAMUSCULAR | Status: AC
  Filled 2015-01-23: qty 1

## 2015-01-23 MED ORDER — DIPHENHYDRAMINE HCL 50 MG/ML IJ SOLN
50.0000 mg | Freq: Once | INTRAMUSCULAR | Status: AC
  Administered 2015-01-23: 50 mg via INTRAVENOUS

## 2015-01-23 MED ORDER — HEPARIN SOD (PORK) LOCK FLUSH 100 UNIT/ML IV SOLN
500.0000 [IU] | Freq: Once | INTRAVENOUS | Status: AC | PRN
  Administered 2015-01-23: 500 [IU]
  Filled 2015-01-23: qty 5

## 2015-01-23 NOTE — Progress Notes (Signed)
To provide support and encouragement, care continuity and to assess for needs, met with patient in Infusion.  She was accompanied by her husband. She reported chemo tmts are going well, she is looking forward to PET scheduled for later this month. She asked that I confirm that her ENT Dr. Wilburn Cornelia has access to her updated treatment notes. She denied any needs or concerns; I encouraged her to contact me if that changes before I see her next, she verbalized understanding.  Gayleen Orem, RN, BSN, [place] [facility] [phone]

## 2015-01-23 NOTE — Patient Instructions (Signed)
[facility] Discharge Instructions for Patients Receiving Chemotherapy  Today you received the following chemotherapy agents erbitux  To help prevent nausea and vomiting after your treatment, we encourage you to take your nausea medication as directed   If you develop nausea and vomiting that is not controlled by your nausea medication, call the clinic.   BELOW ARE SYMPTOMS THAT SHOULD BE REPORTED IMMEDIATELY:  *FEVER GREATER THAN 100.5 F  *CHILLS WITH OR WITHOUT FEVER  NAUSEA AND VOMITING THAT IS NOT CONTROLLED WITH YOUR NAUSEA MEDICATION  *UNUSUAL SHORTNESS OF BREATH  *UNUSUAL BRUISING OR BLEEDING  TENDERNESS IN MOUTH AND THROAT WITH OR WITHOUT PRESENCE OF ULCERS  *URINARY PROBLEMS  *BOWEL PROBLEMS  UNUSUAL RASH Items with * indicate a potential emergency and should be followed up as soon as possible.  Feel free to call the clinic you have any questions or concerns. The clinic phone number is [phone].

## 2015-02-04 ENCOUNTER — Other Ambulatory Visit: Payer: Self-pay | Admitting: Hematology and Oncology

## 2015-02-04 DIAGNOSIS — C139 Malignant neoplasm of hypopharynx, unspecified: Secondary | ICD-10-CM

## 2015-02-05 ENCOUNTER — Other Ambulatory Visit (HOSPITAL_BASED_OUTPATIENT_CLINIC_OR_DEPARTMENT_OTHER): Payer: Medicare Other

## 2015-02-05 ENCOUNTER — Ambulatory Visit: Payer: Medicare Other

## 2015-02-05 ENCOUNTER — Ambulatory Visit (HOSPITAL_COMMUNITY)
Admission: RE | Admit: 2015-02-05 | Discharge: 2015-02-05 | Disposition: A | Discharge status: Home / Self Care | Payer: Medicare Other | Source: Ambulatory Visit | Attending: Hematology and Oncology | Admitting: Hematology and Oncology

## 2015-02-05 VITALS — BP 118/77 | HR 82 | Temp 97.9°F | Resp 18

## 2015-02-05 DIAGNOSIS — Z95828 Presence of other vascular implants and grafts: Secondary | ICD-10-CM

## 2015-02-05 DIAGNOSIS — C109 Malignant neoplasm of oropharynx, unspecified: Secondary | ICD-10-CM | POA: Insufficient documentation

## 2015-02-05 DIAGNOSIS — C139 Malignant neoplasm of hypopharynx, unspecified: Secondary | ICD-10-CM

## 2015-02-05 DIAGNOSIS — C7801 Secondary malignant neoplasm of right lung: Secondary | ICD-10-CM | POA: Insufficient documentation

## 2015-02-05 DIAGNOSIS — C770 Secondary and unspecified malignant neoplasm of lymph nodes of head, face and neck: Secondary | ICD-10-CM

## 2015-02-05 DIAGNOSIS — C7800 Secondary malignant neoplasm of unspecified lung: Secondary | ICD-10-CM | POA: Diagnosis present

## 2015-02-05 DIAGNOSIS — C7802 Secondary malignant neoplasm of left lung: Secondary | ICD-10-CM | POA: Insufficient documentation

## 2015-02-05 LAB — COMPREHENSIVE METABOLIC PANEL (CC13)
ALK PHOS: 85 U/L (ref 40–150)
ALT: 9 U/L (ref 0–55)
AST: 16 U/L (ref 5–34)
Albumin: 3.9 g/dL (ref 3.5–5.0)
Anion Gap: 10 mEq/L (ref 3–11)
BUN: 12.2 mg/dL (ref 7.0–26.0)
CO2: 25 mEq/L (ref 22–29)
CREATININE: 0.8 mg/dL (ref 0.6–1.1)
Calcium: 9.4 mg/dL (ref 8.4–10.4)
Chloride: 106 mEq/L (ref 98–109)
EGFR: >90 mL/min/{1.73_m2} (ref >90)
Glucose: 84 mg/dl (ref 70–140)
Potassium: 3.7 mEq/L (ref 3.5–5.1)
Sodium: 140 mEq/L (ref 136–145)
Total Bilirubin: 0.31 mg/dL (ref 0.20–1.20)
Total Protein: 7.2 g/dL (ref 6.4–8.3)

## 2015-02-05 LAB — CBC WITH DIFFERENTIAL/PLATELET
BASO%: 0.5 % (ref 0.0–2.0)
BASOS ABS: 0 10*3/uL (ref 0.0–0.1)
EOS ABS: 0.1 10*3/uL (ref 0.0–0.5)
EOS%: 3.3 % (ref 0.0–7.0)
HCT: 36.3 % (ref 34.8–46.6)
HGB: 11.7 g/dL (ref 11.6–15.9)
LYMPH%: 31.3 % (ref 14.0–49.7)
MCH: 31.3 pg (ref 25.1–34.0)
MCHC: 32.2 g/dL (ref 31.5–36.0)
MCV: 97.3 fL (ref 79.5–101.0)
MONO#: 0.3 10*3/uL (ref 0.1–0.9)
MONO%: 6 % (ref 0.0–14.0)
NEUT%: 58.9 % (ref 38.4–76.8)
NEUTROS ABS: 2.5 10*3/uL (ref 1.5–6.5)
PLATELETS: 255 10*3/uL (ref 145–400)
RBC: 3.73 10*6/uL (ref 3.70–5.45)
RDW: 14.5 % (ref 11.2–14.5)
WBC: 4.2 10*3/uL (ref 3.9–10.3)
lymph#: 1.3 10*3/uL (ref 0.9–3.3)

## 2015-02-05 LAB — GLUCOSE, CAPILLARY: Glucose-Capillary: 101 mg/dL — ABNORMAL HIGH (ref 70–99)

## 2015-02-05 MED ORDER — SODIUM CHLORIDE 0.9 % IJ SOLN
10.0000 mL | INTRAMUSCULAR | Status: DC | PRN
  Administered 2015-02-05: 10 mL via INTRAVENOUS
  Filled 2015-02-05: qty 10

## 2015-02-05 MED ORDER — FLUDEOXYGLUCOSE F - 18 (FDG) INJECTION
6.6000 | Freq: Once | INTRAVENOUS | Status: AC | PRN
  Administered 2015-02-05: 6.6 via INTRAVENOUS

## 2015-02-05 MED ORDER — HEPARIN SOD (PORK) LOCK FLUSH 100 UNIT/ML IV SOLN
500.0000 [IU] | Freq: Once | INTRAVENOUS | Status: AC
  Administered 2015-02-05: 500 [IU] via INTRAVENOUS
  Filled 2015-02-05: qty 5

## 2015-02-05 NOTE — Patient Instructions (Signed)

## 2015-02-06 ENCOUNTER — Ambulatory Visit: Payer: Self-pay

## 2015-02-06 ENCOUNTER — Ambulatory Visit (HOSPITAL_BASED_OUTPATIENT_CLINIC_OR_DEPARTMENT_OTHER): Payer: Medicare Other | Admitting: Hematology and Oncology

## 2015-02-06 ENCOUNTER — Other Ambulatory Visit: Payer: Self-pay | Admitting: Hematology and Oncology

## 2015-02-06 ENCOUNTER — Telehealth: Payer: Self-pay | Admitting: *Deleted

## 2015-02-06 ENCOUNTER — Encounter: Payer: Self-pay | Admitting: Hematology and Oncology

## 2015-02-06 VITALS — BP 140/67 | HR 76 | Temp 97.8°F | Resp 18 | Ht 61.0 in | Wt 134.5 lb

## 2015-02-06 DIAGNOSIS — C139 Malignant neoplasm of hypopharynx, unspecified: Secondary | ICD-10-CM | POA: Diagnosis not present

## 2015-02-06 DIAGNOSIS — C770 Secondary and unspecified malignant neoplasm of lymph nodes of head, face and neck: Secondary | ICD-10-CM

## 2015-02-06 DIAGNOSIS — C7951 Secondary malignant neoplasm of bone: Secondary | ICD-10-CM | POA: Diagnosis not present

## 2015-02-06 DIAGNOSIS — G622 Polyneuropathy due to other toxic agents: Secondary | ICD-10-CM

## 2015-02-06 DIAGNOSIS — G620 Drug-induced polyneuropathy: Secondary | ICD-10-CM | POA: Insufficient documentation

## 2015-02-06 DIAGNOSIS — T451X5A Adverse effect of antineoplastic and immunosuppressive drugs, initial encounter: Secondary | ICD-10-CM

## 2015-02-06 DIAGNOSIS — R0781 Pleurodynia: Secondary | ICD-10-CM

## 2015-02-06 NOTE — Progress Notes (Signed)
[facility] OFFICE PROGRESS NOTE  Patient Care Team: Lester Kinsman, PA-C as PCP - General (Physician Assistant) Thea Silversmith, MD (Radiation Oncology) Jerrell Belfast, MD (Otolaryngology) Heath Lark, MD as Consulting Physician (Hematology and Oncology) Leota Sauers, RN as Oncology Nurse Navigator (Oncology)  SUMMARY OF ONCOLOGIC HISTORY: Oncology History   Cancer of hypopharynx, base of tongue, squamous cell carcinoma, HPV focally positive   Primary site: Pharynx - Hypopharynx   Staging method: AJCC 7th Edition   Clinical: Stage III (T2, N1, M0) signed by Thea Silversmith, MD on 10/07/2011 11:50 AM   Summary: Stage III (T2, N1, M0)       Cancer of hypopharynx   07/21/2011 Pathology Results Neck fine needle aspirate was negative for malignancy.   09/03/2011 Pathology Results Right neck fine needle aspirate and biopsy confirmed malignancy, squamous cell carcinoma, HPV marginally positive.   09/30/2011 Imaging PET CT scan confirmed right hypopharynx mass with regional lymphadenopathy.   10/28/2011 - 12/17/2011 Radiation Therapy The patient completed radiation therapy.   10/28/2011 - 12/23/2011 Chemotherapy The patient received 3 doses of high dose cisplatin. Her last dose of chemotherapy have 25% dose adjustment due to side effects.   03/16/2012 Imaging Repeat PET scan confirmed near complete response to treatment.   05/23/2012 Imaging Repeat PET/CT scan confirmed recurrence of disease in the right neck lymph node.   06/14/2012 Pathology Results Pathology confirmed only one lymph node involvement.   06/14/2012 Surgery The patient underwent right neck dissection for residual and recurrence of disease.   04/17/2014 Imaging Chest x-ray show multiple pulmonary nodules. CT scan of the neck showed no evidence of recurrence of disease.   04/18/2014 Imaging CT chest confirmed multiple pulmonary nodules highly suspicious for metastatic disease.   05/21/2014 Imaging PET CT scan showed multiple  pulmonary nodules which are hypermetabolic.   05/27/2014 Procedure [id number] CT-guided biopsy confirms malignant cells, compatible with squamous cell carcinoma.   06/13/2014 - 10/31/2014 Chemotherapy She completed 6 cycles of palliative treatment with carboplatin, 5-FU and Erbitux   07/25/2014 Adverse Reaction Cycle 3 chemo is held and delayed due to leukopenia   08/20/2014 Imaging CT scan showed partial response to treatment.   11/13/2014 Imaging PET CT scan showed stable disease   11/14/2014 - 01/23/2015 Chemotherapy She received maintenance Erbitux every 2 weeks   02/05/2015 Imaging PET CT scan showed disease progression with new lesions.    INTERVAL HISTORY: Please see below for problem oriented charting.  she returns to review test results. She have some rib pain on the right posterior chest wall but it is not bothered her too much. She is bothered by peripheral neuropathy with numbness and tingling but not severe pain. She denies chest pain or shortness of breath.  REVIEW OF SYSTEMS:   Constitutional: Denies fevers, chills or abnormal weight loss Eyes: Denies blurriness of vision Ears, nose, mouth, throat, and face: Denies mucositis or sore throat Respiratory: Denies cough, dyspnea or wheezes Cardiovascular: Denies palpitation, chest discomfort or lower extremity swelling Gastrointestinal:  Denies nausea, heartburn or change in bowel habits Skin: Denies abnormal skin rashes Lymphatics: Denies new lymphadenopathy or easy bruising Neurological:Denies numbness, tingling or new weaknesses Behavioral/Psych: Mood is stable, no new changes  All other systems were reviewed with the patient and are negative.  I have reviewed the past medical history, past surgical history, social history and family history with the patient and they are unchanged from previous note.  ALLERGIES:  has No Known Allergies.  MEDICATIONS:  Current Outpatient Prescriptions  Medication Sig Dispense Refill  . Alum  & Mag Hydroxide-Simeth (MAGIC MOUTHWASH) SOLN Take 5 mLs by mouth 4 (four) times daily as needed for mouth pain. 240 mL 0  . calcium carbonate (OS-CAL) 1250 MG chewable tablet Chew 1 tablet by mouth daily.     . Cholecalciferol (VITAMIN D) 2000 UNITS CAPS Take 2,000 Units by mouth daily.     . clindamycin (CLINDAGEL) 1 % gel Apply topically 2 (two) times daily. 60 g 3  . docusate sodium (COLACE) 100 MG capsule Take 1 capsule (100 mg total) by mouth 2 (two) times daily as needed for mild constipation (keep stool soft). 60 capsule 3  . fexofenadine-pseudoephedrine (ALLEGRA-D 24) 180-240 MG per 24 hr tablet Take 1 tablet by mouth daily as needed (for allergies).     . hydrocortisone (ANUSOL-HC) 2.5 % rectal cream Place 1 application rectally 2 (two) times daily. Use on hemorrhoids as needed for one week.  Call MD if not better in one week. 30 g 0  . hydrocortisone 1 % ointment Apply 1 application topically 2 (two) times daily. 30 g 0  . lidocaine-prilocaine (EMLA) cream Apply 1 application topically as needed. 30 g 6  . Omega-3 Fatty Acids (OMEGA-3 FISH OIL PO) Take 1,280 mg by mouth daily.    Marland Kitchen omeprazole (PRILOSEC) 40 MG capsule Take 40 mg by mouth daily as needed (Acid reflux).     Marland Kitchen OVER THE COUNTER MEDICATION Take 1 tablet by mouth daily. MED NAME: Vitamin B-17    . oxyCODONE (OXY IR/ROXICODONE) 5 MG immediate release tablet Take 1 tablet (5 mg total) by mouth every 4 (four) hours as needed for severe pain. 30 tablet 0  . Polyethyl Glycol-Propyl Glycol (SYSTANE OP) Place 1 drop into both eyes 3 (three) times daily as needed (for allergies).     . pravastatin (PRAVACHOL) 40 MG tablet Take 40 mg by mouth daily.     No current facility-administered medications for this visit.    PHYSICAL EXAMINATION: ECOG PERFORMANCE STATUS: 1 - Symptomatic but completely ambulatory  Filed Vitals:   02/06/15 0841  BP: 140/67  Pulse: 76  Temp: 97.8 F (36.6 C)  Resp: 18   Filed Weights   02/06/15 0841   Weight: 134 lb 8 oz (61.009 kg)    GENERAL:alert, no distress and comfortable SKIN: skin color, texture, turgor are normal, no rashes or significant lesions. Mild acneform rash EYES: normal, Conjunctiva are pink and non-injected, sclera clear OROPHARYNX:no exudate, no erythema and lips, buccal mucosa, and tongue normal  NECK:  Significant  Surgery and radiation-induced fibrosis. NEURO: alert & oriented x 3 with fluent speech, no focal motor/sensory deficits  LABORATORY DATA:  I have reviewed the data as listed    Component Value Date/Time   NA 140 02/05/2015 1415   NA 140 06/07/2012 1241   K 3.7 02/05/2015 1415   K 3.9 06/07/2012 1241   CL 103 04/17/2013 0803   CL 103 06/07/2012 1241   CO2 25 02/05/2015 1415   CO2 28 06/07/2012 1241   GLUCOSE 84 02/05/2015 1415   GLUCOSE 78 04/17/2013 0803   GLUCOSE 91 06/07/2012 1241   BUN 12.2 02/05/2015 1415   BUN 16 06/07/2012 1241   CREATININE 0.8 02/05/2015 1415   CREATININE 1.05 06/07/2012 1241   CALCIUM 9.4 02/05/2015 1415   CALCIUM 9.3 06/07/2012 1241   PROT 7.2 02/05/2015 1415   PROT 6.3 03/17/2012 0824   ALBUMIN 3.9 02/05/2015 1415   ALBUMIN 4.1 03/17/2012 0824  AST 16 02/05/2015 1415   AST 16 03/17/2012 0824   ALT 9 02/05/2015 1415   ALT <8 03/17/2012 0824   ALKPHOS 85 02/05/2015 1415   ALKPHOS 61 03/17/2012 0824   BILITOT 0.31 02/05/2015 1415   BILITOT 0.2* 03/17/2012 0824   GFRNONAA 55* 06/07/2012 1241   GFRAA 64* 06/07/2012 1241    No results found for: SPEP, UPEP  Lab Results  Component Value Date   WBC 4.2 02/05/2015   NEUTROABS 2.5 02/05/2015   HGB 11.7 02/05/2015   HCT 36.3 02/05/2015   MCV 97.3 02/05/2015   PLT 255 02/05/2015      Chemistry      Component Value Date/Time   NA 140 02/05/2015 1415   NA 140 06/07/2012 1241   K 3.7 02/05/2015 1415   K 3.9 06/07/2012 1241   CL 103 04/17/2013 0803   CL 103 06/07/2012 1241   CO2 25 02/05/2015 1415   CO2 28 06/07/2012 1241   BUN 12.2 02/05/2015 1415    BUN 16 06/07/2012 1241   CREATININE 0.8 02/05/2015 1415   CREATININE 1.05 06/07/2012 1241      Component Value Date/Time   CALCIUM 9.4 02/05/2015 1415   CALCIUM 9.3 06/07/2012 1241   ALKPHOS 85 02/05/2015 1415   ALKPHOS 61 03/17/2012 0824   AST 16 02/05/2015 1415   AST 16 03/17/2012 0824   ALT 9 02/05/2015 1415   ALT <8 03/17/2012 0824   BILITOT 0.31 02/05/2015 1415   BILITOT 0.2* 03/17/2012 0824       RADIOGRAPHIC STUDIES: I have personally reviewed the radiological images as listed and agreed with the findings in the report. Nm Pet Image Restag (ps) Skull Base To Thigh  02/06/2015   CLINICAL DATA:  Subsequent treatment strategy for metastatic laryngeal cancer.  EXAM: NUCLEAR MEDICINE PET SKULL BASE TO THIGH  TECHNIQUE: 6.62 mCi F-18 FDG was injected intravenously. Full-ring PET imaging was performed from the skull base to thigh after the radiotracer. CT data was obtained and used for attenuation correction and anatomic localization.  FASTING BLOOD GLUCOSE:  Value: 101 mg/dl  COMPARISON:  11/13/2014  FINDINGS: NECK  No hypermetabolic lymph nodes in the neck.  No recurrent neck mass.  CHEST  Enlarging bilateral pulmonary nodules and increasing hypermetabolism. There are also enlarging mediastinal lymph nodes.  20 x 14.5 mm right precarinal lymph node previously measured 12 x 7.5 mm. SUV max is 6.2, increased from 2.5.  Left lower lobe pulmonary nodule measures 23 x 17.5 mm and has SUV max of 8.8. Previous measurement 14 x 13 mm and SUV max was 3.4. Other nodules and mediastinal lymph nodes have enlarged and demonstrate increased hypermetabolism.  ABDOMEN/PELVIS  No abnormal hypermetabolic activity within the liver, pancreas, adrenal glands, or spleen. No hypermetabolic lymph nodes in the abdomen or pelvis.  SKELETON  Two new hypermetabolic right rib lesions are suspicious for new osseous metastasis. No definite CT correlate.  IMPRESSION: Progressive pulmonary metastatic disease and  mediastinal disease.  Two new hypermetabolic right rib lesions suspicious for new osseous metastasis.   Electronically Signed   By: Marijo Sanes M.D.   On: 02/06/2015 09:09     ASSESSMENT & PLAN:  Cancer of hypopharynx  Unfortunately, imaging study confirmed disease progression.  I discussed with her current national cancer guidelines. I encourage her to consider second opinion. Second line treatment will include cisplatin, paclitaxel, docetaxel, methotrexate or gemcitabine. We discussed single agent versus combination treatment. Due to significant peripheral neuropathy, I did warn her  that cisplatin-based treatment or taxanes would cause worsening neuropathy. She will think about it and will call me once she had made her decision. The risk, benefit, side effects of treatment were fully discussed with the patient and her husband. She was given patient education handout.   Neuropathy due to chemotherapeutic drug  This is stable, at great 1-2. She is asymptomatic. Recommend observation only.   Metastasis to bone  She is mildly symptomatic from bone lesion. She has pain medicine to take as needed at home.  continue conservative management for now.    No orders of the defined types were placed in this encounter.   All questions were answered. The patient knows to call the clinic with any problems, questions or concerns. No barriers to learning was detected. I spent 30 minutes counseling the patient face to face. The total time spent in the appointment was 40 minutes and more than 50% was on counseling and review of test results     [facility], Jomari Bartnik, MD 02/06/2015 11:12 AM

## 2015-02-06 NOTE — Assessment & Plan Note (Signed)
This is stable, at great 1-2. She is asymptomatic. Recommend observation only.

## 2015-02-06 NOTE — Telephone Encounter (Signed)
VERIFIED ERBITUX DOSES FOR 10/31/14, 11/14/14, AND 11/28/14.

## 2015-02-06 NOTE — Assessment & Plan Note (Signed)
Unfortunately, imaging study confirmed disease progression.  I discussed with her current national cancer guidelines. I encourage her to consider second opinion. Second line treatment will include cisplatin, paclitaxel, docetaxel, methotrexate or gemcitabine. We discussed single agent versus combination treatment. Due to significant peripheral neuropathy, I did warn her that cisplatin-based treatment or taxanes would cause worsening neuropathy. She will think about it and will call me once she had made her decision. The risk, benefit, side effects of treatment were fully discussed with the patient and her husband. She was given patient education handout.

## 2015-02-06 NOTE — Assessment & Plan Note (Signed)
She is mildly symptomatic from bone lesion. She has pain medicine to take as needed at home.  continue conservative management for now.

## 2015-02-20 ENCOUNTER — Ambulatory Visit: Payer: Self-pay

## 2015-04-11 ENCOUNTER — Telehealth: Payer: Self-pay | Admitting: *Deleted

## 2015-04-11 NOTE — Telephone Encounter (Signed)
TC from patient stating she was out of town this last week and ended up in the hospital with a pleural effusion. She had it drained. She is home now and wants to set up appt to see Dr. Alvy Bimler

## 2015-04-14 NOTE — Telephone Encounter (Signed)
I can see her 6/8 at 1015 if she can some in that day If so, please put in POF. 30 mins

## 2015-04-15 ENCOUNTER — Other Ambulatory Visit: Payer: Self-pay | Admitting: *Deleted

## 2015-04-15 ENCOUNTER — Telehealth: Payer: Self-pay | Admitting: *Deleted

## 2015-04-15 DIAGNOSIS — C139 Malignant neoplasm of hypopharynx, unspecified: Secondary | ICD-10-CM

## 2015-04-15 NOTE — Telephone Encounter (Signed)
Notified pt of appt on 6/8, she verbalized understanding.

## 2015-04-15 NOTE — Telephone Encounter (Signed)
POF placed with scheduling for 04/23/15 @ 10:15 am

## 2015-04-16 ENCOUNTER — Telehealth: Payer: Self-pay | Admitting: Hematology and Oncology

## 2015-04-16 NOTE — Telephone Encounter (Signed)
Added appt per pof...per orders pof pt aware °

## 2015-04-23 ENCOUNTER — Ambulatory Visit (HOSPITAL_BASED_OUTPATIENT_CLINIC_OR_DEPARTMENT_OTHER): Payer: Medicare Other | Admitting: Hematology and Oncology

## 2015-04-23 ENCOUNTER — Encounter: Payer: Self-pay | Admitting: *Deleted

## 2015-04-23 ENCOUNTER — Telehealth: Payer: Self-pay | Admitting: Hematology and Oncology

## 2015-04-23 ENCOUNTER — Ambulatory Visit (HOSPITAL_COMMUNITY)
Admission: RE | Admit: 2015-04-23 | Discharge: 2015-04-23 | Disposition: A | Discharge status: Home / Self Care | Payer: Medicare Other | Source: Ambulatory Visit | Attending: Hematology and Oncology | Admitting: Hematology and Oncology

## 2015-04-23 ENCOUNTER — Other Ambulatory Visit (HOSPITAL_BASED_OUTPATIENT_CLINIC_OR_DEPARTMENT_OTHER): Payer: Medicare Other

## 2015-04-23 VITALS — BP 128/54 | HR 94 | Temp 97.8°F | Resp 19 | Ht 61.0 in | Wt 122.4 lb

## 2015-04-23 DIAGNOSIS — C7951 Secondary malignant neoplasm of bone: Secondary | ICD-10-CM

## 2015-04-23 DIAGNOSIS — J90 Pleural effusion, not elsewhere classified: Secondary | ICD-10-CM | POA: Insufficient documentation

## 2015-04-23 DIAGNOSIS — D630 Anemia in neoplastic disease: Secondary | ICD-10-CM

## 2015-04-23 DIAGNOSIS — C139 Malignant neoplasm of hypopharynx, unspecified: Secondary | ICD-10-CM | POA: Insufficient documentation

## 2015-04-23 DIAGNOSIS — E46 Unspecified protein-calorie malnutrition: Secondary | ICD-10-CM

## 2015-04-23 LAB — COMPREHENSIVE METABOLIC PANEL (CC13)
ALT: 11 U/L (ref 0–55)
AST: 16 U/L (ref 5–34)
Albumin: 2.5 g/dL — ABNORMAL LOW (ref 3.5–5.0)
Alkaline Phosphatase: 90 U/L (ref 40–150)
Anion Gap: 9 mEq/L (ref 3–11)
BUN: 9 mg/dL (ref 7.0–26.0)
CO2: 28 meq/L (ref 22–29)
Calcium: 9.2 mg/dL (ref 8.4–10.4)
Chloride: 102 mEq/L (ref 98–109)
Creatinine: 0.7 mg/dL (ref 0.6–1.1)
GLUCOSE: 94 mg/dL (ref 70–140)
Potassium: 3.8 mEq/L (ref 3.5–5.1)
SODIUM: 138 meq/L (ref 136–145)
TOTAL PROTEIN: 6.8 g/dL (ref 6.4–8.3)
Total Bilirubin: 0.48 mg/dL (ref 0.20–1.20)

## 2015-04-23 LAB — CBC WITH DIFFERENTIAL/PLATELET
BASO%: 0.1 % (ref 0.0–2.0)
Basophils Absolute: 0 10*3/uL (ref 0.0–0.1)
EOS%: 1.6 % (ref 0.0–7.0)
Eosinophils Absolute: 0.1 10*3/uL (ref 0.0–0.5)
HCT: 29.3 % — ABNORMAL LOW (ref 34.8–46.6)
HGB: 9.5 g/dL — ABNORMAL LOW (ref 11.6–15.9)
LYMPH%: 19.3 % (ref 14.0–49.7)
MCH: 29.5 pg (ref 25.1–34.0)
MCHC: 32.4 g/dL (ref 31.5–36.0)
MCV: 91 fL (ref 79.5–101.0)
MONO#: 0.7 10*3/uL (ref 0.1–0.9)
MONO%: 10.4 % (ref 0.0–14.0)
NEUT#: 4.8 10*3/uL (ref 1.5–6.5)
NEUT%: 68.6 % (ref 38.4–76.8)
Platelets: 401 10*3/uL — ABNORMAL HIGH (ref 145–400)
RBC: 3.22 10*6/uL — AB (ref 3.70–5.45)
RDW: 14.3 % (ref 11.2–14.5)
WBC: 7 10*3/uL (ref 3.9–10.3)
lymph#: 1.4 10*3/uL (ref 0.9–3.3)

## 2015-04-23 NOTE — Assessment & Plan Note (Signed)
Clinically she has recurrent effusion on exam. At the time of dictation, CXR confirmed recurrence I recommend referral to pulmonology for management. She is not symptomatic now. I have asked her to sign consent to release information to obtain outside records related to recent hospitalization for pleural effusion.

## 2015-04-23 NOTE — Progress Notes (Signed)
Pt left VM states she was treated at [facility].  Phone [phone].   I faxed the signed release of information forms to their Medical Records Dept at fax [phone].

## 2015-04-23 NOTE — Assessment & Plan Note (Signed)
This is moderate to severe. Albumin level is low and she has lost 12 pounds in 3 months Her appetite has improved since recent thoracentesis. I recommend increase oral intake as tolerated I will also consult our dietitian to follow

## 2015-04-23 NOTE — Assessment & Plan Note (Signed)
She is not symptomatic. Observe for now

## 2015-04-23 NOTE — Assessment & Plan Note (Signed)
Unfortunately she has clinical signs of diease progression. I will proceed to order staging PET scan and refer her to [facility] for second opinion for treatment options

## 2015-04-23 NOTE — Assessment & Plan Note (Signed)
This is likely anemia of chronic disease or related to bone mets. The patient denies recent history of bleeding such as epistaxis, hematuria or hematochezia. She is asymptomatic from the anemia. We will observe for now.  She does not require transfusion now.

## 2015-04-23 NOTE — Progress Notes (Signed)
[place] OFFICE PROGRESS NOTE  Patient Care Team: Lester Kinsman, PA-C as PCP - General (Physician Assistant) Thea Silversmith, MD (Radiation Oncology) Jerrell Belfast, MD (Otolaryngology) Heath Lark, MD as Consulting Physician (Hematology and Oncology) Leota Sauers, RN as Oncology Nurse Navigator (Oncology)  SUMMARY OF ONCOLOGIC HISTORY: Oncology History   Cancer of hypopharynx, base of tongue, squamous cell carcinoma, HPV focally positive   Primary site: Pharynx - Hypopharynx   Staging method: AJCC 7th Edition   Clinical: Stage III (T2, N1, M0) signed by Thea Silversmith, MD on 10/07/2011 11:50 AM   Summary: Stage III (T2, N1, M0)       Cancer of hypopharynx   07/21/2011 Pathology Results Neck fine needle aspirate was negative for malignancy.   09/03/2011 Pathology Results Right neck fine needle aspirate and biopsy confirmed malignancy, squamous cell carcinoma, HPV marginally positive.   09/30/2011 Imaging PET CT scan confirmed right hypopharynx mass with regional lymphadenopathy.   10/28/2011 - 12/17/2011 Radiation Therapy The patient completed radiation therapy.   10/28/2011 - 12/23/2011 Chemotherapy The patient received 3 doses of high dose cisplatin. Her last dose of chemotherapy have 25% dose adjustment due to side effects.   03/16/2012 Imaging Repeat PET scan confirmed near complete response to treatment.   05/23/2012 Imaging Repeat PET/CT scan confirmed recurrence of disease in the right neck lymph node.   06/14/2012 Pathology Results Pathology confirmed only one lymph node involvement.   06/14/2012 Surgery The patient underwent right neck dissection for residual and recurrence of disease.   04/17/2014 Imaging Chest x-ray show multiple pulmonary nodules. CT scan of the neck showed no evidence of recurrence of disease.   04/18/2014 Imaging CT chest confirmed multiple pulmonary nodules highly suspicious for metastatic disease.   05/21/2014 Imaging PET CT scan showed multiple  pulmonary nodules which are hypermetabolic.   05/27/2014 Procedure [id number] CT-guided biopsy confirms malignant cells, compatible with squamous cell carcinoma.   06/13/2014 - 10/31/2014 Chemotherapy She completed 6 cycles of palliative treatment with carboplatin, 5-FU and Erbitux   07/25/2014 Adverse Reaction Cycle 3 chemo is held and delayed due to leukopenia   08/20/2014 Imaging CT scan showed partial response to treatment.   11/13/2014 Imaging PET CT scan showed stable disease   11/14/2014 - 01/23/2015 Chemotherapy She received maintenance Erbitux every 2 weeks   02/05/2015 Imaging PET CT scan showed disease progression with new lymphadenopathy and new bone lesions.   04/10/2015 - 04/11/2015 Hospital Admission She was admited to an outside hospital for thoracentesis for large pleural effusion. 1700 ml was removed from the right side   04/23/2015 Imaging CXR showed moderate right lung effusion    INTERVAL HISTORY: Please see below for problem oriented charting. She returns for follow-up She has mild new hoarseness and recent increasing dyspnea and non-productive cough She travelled extensively recently but ended up being admitted to an outside hospital. She was found to have new right sided pleural effusion which was drained. She had dizziness, anorexia and lost 12 pounds of weight. Her appetite is slowly improving Has very mild right rib cage discomfort.  REVIEW OF SYSTEMS:   Constitutional: Denies fevers, chills  Eyes: Denies blurriness of vision Ears, nose, mouth, throat, and face: Denies mucositis or sore throat Cardiovascular: Denies palpitation, chest discomfort or lower extremity swelling Gastrointestinal:  Denies nausea, heartburn or change in bowel habits Skin: Denies abnormal skin rashes Lymphatics: Denies new lymphadenopathy or easy bruising Neurological:Denies numbness, tingling or new weaknesses Behavioral/Psych: Mood is stable, no new changes  All other  systems were reviewed  with the patient and are negative.  I have reviewed the past medical history, past surgical history, social history and family history with the patient and they are unchanged from previous note.  ALLERGIES:  has No Known Allergies.  MEDICATIONS:  Current Outpatient Prescriptions  Medication Sig Dispense Refill  . Alum & Mag Hydroxide-Simeth (MAGIC MOUTHWASH) SOLN Take 5 mLs by mouth 4 (four) times daily as needed for mouth pain. 240 mL 0  . calcium carbonate (OS-CAL) 1250 MG chewable tablet Chew 1 tablet by mouth daily.     . Cholecalciferol (VITAMIN D) 2000 UNITS CAPS Take 2,000 Units by mouth daily.     . clindamycin (CLINDAGEL) 1 % gel Apply topically 2 (two) times daily. 60 g 3  . docusate sodium (COLACE) 100 MG capsule Take 1 capsule (100 mg total) by mouth 2 (two) times daily as needed for mild constipation (keep stool soft). 60 capsule 3  . fexofenadine-pseudoephedrine (ALLEGRA-D 24) 180-240 MG per 24 hr tablet Take 1 tablet by mouth daily as needed (for allergies).     . hydrocortisone (ANUSOL-HC) 2.5 % rectal cream Place 1 application rectally 2 (two) times daily. Use on hemorrhoids as needed for one week.  Call MD if not better in one week. 30 g 0  . hydrocortisone 1 % ointment Apply 1 application topically 2 (two) times daily. 30 g 0  . lidocaine-prilocaine (EMLA) cream Apply 1 application topically as needed. 30 g 6  . meloxicam (MOBIC) 15 MG tablet Take 15 mg by mouth as needed.  0  . Omega-3 Fatty Acids (OMEGA-3 FISH OIL PO) Take 1,280 mg by mouth daily.    Marland Kitchen omeprazole (PRILOSEC) 40 MG capsule Take 40 mg by mouth daily as needed (Acid reflux).     Marland Kitchen OVER THE COUNTER MEDICATION Take 1 tablet by mouth daily. MED NAME: Vitamin B-17    . oxyCODONE (OXY IR/ROXICODONE) 5 MG immediate release tablet Take 1 tablet (5 mg total) by mouth every 4 (four) hours as needed for severe pain. 30 tablet 0  . Polyethyl Glycol-Propyl Glycol (SYSTANE OP) Place 1 drop into both eyes 3 (three) times  daily as needed (for allergies).     . pravastatin (PRAVACHOL) 40 MG tablet Take 40 mg by mouth daily.    . Vitamin D, Ergocalciferol, (DRISDOL) 50000 UNITS CAPS capsule Take 50,000 Units by mouth once a week.  0   No current facility-administered medications for this visit.    PHYSICAL EXAMINATION: ECOG PERFORMANCE STATUS: 2 - Symptomatic, <50% confined to bed  Filed Vitals:   04/23/15 1017  BP: 128/54  Pulse: 94  Temp: 97.8 F (36.6 C)  Resp: 19   Filed Weights   04/23/15 1017  Weight: 122 lb 6.4 oz (55.52 kg)    GENERAL:alert, no distress and comfortable SKIN: skin color, texture, turgor are normal, no rashes or significant lesions EYES: normal, Conjunctiva are pink and non-injected, sclera clear OROPHARYNX:no exudate, no erythema and lips, buccal mucosa, and tongue normal  NECK: supple, thyroid normal size, non-tender, without nodularity LYMPH:  no palpable lymphadenopathy in the cervical, axillary or inguinal LUNGS: reduced breath sounds right mid to lower lung which is dull on percussion HEART: regular rate & rhythm and no murmurs and no lower extremity edema ABDOMEN:abdomen soft, non-tender and normal bowel sounds Musculoskeletal:no cyanosis of digits and no clubbing  NEURO: alert & oriented x 3 with fluent speech, no focal motor/sensory deficits  LABORATORY DATA:  I have reviewed the data  as listed    Component Value Date/Time   NA 138 04/23/2015 1007   NA 140 06/07/2012 1241   K 3.8 04/23/2015 1007   K 3.9 06/07/2012 1241   CL 103 04/17/2013 0803   CL 103 06/07/2012 1241   CO2 28 04/23/2015 1007   CO2 28 06/07/2012 1241   GLUCOSE 94 04/23/2015 1007   GLUCOSE 78 04/17/2013 0803   GLUCOSE 91 06/07/2012 1241   BUN 9.0 04/23/2015 1007   BUN 16 06/07/2012 1241   CREATININE 0.7 04/23/2015 1007   CREATININE 1.05 06/07/2012 1241   CALCIUM 9.2 04/23/2015 1007   CALCIUM 9.3 06/07/2012 1241   PROT 6.8 04/23/2015 1007   PROT 6.3 03/17/2012 0824   ALBUMIN 2.5*  04/23/2015 1007   ALBUMIN 4.1 03/17/2012 0824   AST 16 04/23/2015 1007   AST 16 03/17/2012 0824   ALT 11 04/23/2015 1007   ALT <8 03/17/2012 0824   ALKPHOS 90 04/23/2015 1007   ALKPHOS 61 03/17/2012 0824   BILITOT 0.48 04/23/2015 1007   BILITOT 0.2* 03/17/2012 0824   GFRNONAA 55* 06/07/2012 1241   GFRAA 64* 06/07/2012 1241    No results found for: SPEP, UPEP  Lab Results  Component Value Date   WBC 7.0 04/23/2015   NEUTROABS 4.8 04/23/2015   HGB 9.5* 04/23/2015   HCT 29.3* 04/23/2015   MCV 91.0 04/23/2015   PLT 401* 04/23/2015      Chemistry      Component Value Date/Time   NA 138 04/23/2015 1007   NA 140 06/07/2012 1241   K 3.8 04/23/2015 1007   K 3.9 06/07/2012 1241   CL 103 04/17/2013 0803   CL 103 06/07/2012 1241   CO2 28 04/23/2015 1007   CO2 28 06/07/2012 1241   BUN 9.0 04/23/2015 1007   BUN 16 06/07/2012 1241   CREATININE 0.7 04/23/2015 1007   CREATININE 1.05 06/07/2012 1241      Component Value Date/Time   CALCIUM 9.2 04/23/2015 1007   CALCIUM 9.3 06/07/2012 1241   ALKPHOS 90 04/23/2015 1007   ALKPHOS 61 03/17/2012 0824   AST 16 04/23/2015 1007   AST 16 03/17/2012 0824   ALT 11 04/23/2015 1007   ALT <8 03/17/2012 0824   BILITOT 0.48 04/23/2015 1007   BILITOT 0.2* 03/17/2012 0824       RADIOGRAPHIC STUDIES: I have personally reviewed the radiological images as listed and agreed with the findings in the report. Dg Chest 2 View  04/23/2015   CLINICAL DATA:  Recurrent pleural effusion and shortness of breath. History of laryngeal cancer.  EXAM: CHEST  2 VIEW  COMPARISON:  PET-CT 02/05/2015  FINDINGS: Left-sided Port-A-Cath remains in place with tip overlying the lower SVC. Cardiac silhouette is within normal limits for size. There is a moderate-sized right pleural effusion which is new from the prior PET-CT. Right lower lobe and patchy right perihilar opacities are present and may represent a combination of atelectasis, metastases, and possibly loculated  fissural fluid. Approximately 2.6 cm left lower lobe metastasis is similar to the prior PET-CT. No left-sided pleural effusion or left lung airspace consolidation is identified. There is no pneumothorax. No acute osseous abnormality is identified.  IMPRESSION: 1. Moderate right pleural effusion. Right perihilar and right basilar parenchymal lung opacities may represent a combination of atelectasis and metastases. Infection is not excluded. 2. Similar size of left lower lobe metastasis.   Electronically Signed   By: Logan Bores   On: 04/23/2015 11:57  ASSESSMENT & PLAN:  Cancer of hypopharynx Unfortunately she has clinical signs of diease progression. I will proceed to order staging PET scan and refer her to [facility] for second opinion for treatment options  Anemia in neoplastic disease This is likely anemia of chronic disease or related to bone mets. The patient denies recent history of bleeding such as epistaxis, hematuria or hematochezia. She is asymptomatic from the anemia. We will observe for now.  She does not require transfusion now.    Metastasis to bone She is not symptomatic. Observe for now  Recurrent pleural effusion on right Clinically she has recurrent effusion on exam. At the time of dictation, CXR confirmed recurrence I recommend referral to pulmonology for management. She is not symptomatic now. I have asked her to sign consent to release information to obtain outside records related to recent hospitalization for pleural effusion.  Protein calorie malnutrition This is moderate to severe. Albumin level is low and she has lost 12 pounds in 3 months Her appetite has improved since recent thoracentesis. I recommend increase oral intake as tolerated I will also consult our dietitian to follow  I reviewed with her recent research publication. CheckMate-141 was a randomized, phase III clinical trial designed to determine whether the PD-1 inhibitor nivolumab could extend  overall survival for patients with platinum-refractory recurrent or metastatic head and neck squamous cell carcinoma compared with treatment of the investigator's choice, which was any of the commonly used therapeutics docetaxel, methotrexate, or cetuximab. Of the 361 patients enrolled in the clinical trial, 240 were randomly assigned to nivolumab and 121 to single-agent chemotherapy of investigator's choice. At the interim analysis, which was conducted after 218 events, patients assigned to nivolumab were found to have a 30 percent reduction in risk of death compared with those assigned therapy of investigator's choice. Median overall survival was 7.5 months for those assigned nivolumab versus 5.1 months for those assigned therapy of investigator's choice. At 12 months, 36 percent of the patients treated with nivolumab were alive compared with 17 percent of those assigned therapy of investigator's choice.  Orders Placed This Encounter  Procedures  . DG Chest 2 View    Standing Status: Future     Number of Occurrences: 1     Standing Expiration Date: 05/27/2016    Order Specific Question:  Reason for exam:    Answer:  recurrent pleural effusion, SOB    Order Specific Question:  Preferred imaging location?    Answer:  [facility] To Th[facility]    Standing Status: Future     Number of Occurrences:      Standing Expiration Date: 06/22/2016    Order Specific Question:  Reason for Exam (SYMPTOM  OR DIAGNOSIS REQUIRED)    Answer:  staging metastatic laryngeal ca    Order Specific Question:  Preferred imaging location?    Answer:  [facility]  . Ambulatory referral to Pulmonology    Referral Priority:  Routine    Referral Type:  Consultation    Referral Reason:  Specialty Services Required    Requested Specialty:  Pulmonary Disease    Number of Visits Requested:  1   All questions were answered. The patient knows to call the clinic with any  problems, questions or concerns. No barriers to learning was detected. I spent 30 minutes counseling the patient face to face. The total time spent in the appointment was 40 minutes and more than 50% was  on counseling and review of test results     [address] [facility], [place], MD 04/23/2015 8:52 PM

## 2015-04-23 NOTE — Telephone Encounter (Signed)
Gave and printed appt sched and avs for pt for June ....Marland KitchenJune 14 @ 2:15pm with Dr. Melvyn Novas

## 2015-04-24 ENCOUNTER — Telehealth: Payer: Self-pay | Admitting: Hematology and Oncology

## 2015-04-24 ENCOUNTER — Telehealth: Payer: Self-pay | Admitting: *Deleted

## 2015-04-24 NOTE — Telephone Encounter (Signed)
-----   Message from Heath Lark, MD sent at 04/23/2015  8:46 PM EDT ----- Regarding: referral Not sure if I have asked already for referral to [facility] med onc that specialize in ENT/head and neck ca

## 2015-04-24 NOTE — Telephone Encounter (Signed)
Pt appt with Dr. Melven Sartorius @ [facility] is 05/14/15'@8'$ :20. Medical records faxed,slides and scans will be fedex'ed

## 2015-04-24 NOTE — Telephone Encounter (Signed)
Referral/ office notes given to [facility] in HIM

## 2015-04-25 ENCOUNTER — Telehealth: Payer: Self-pay | Admitting: Hematology and Oncology

## 2015-04-25 NOTE — Telephone Encounter (Signed)
lvm for pt regarding to JUNE nut appt added.....advised pt to call back if she needs to r/s

## 2015-04-28 ENCOUNTER — Telehealth: Payer: Self-pay | Admitting: Hematology and Oncology

## 2015-04-28 NOTE — Telephone Encounter (Signed)
s.w. pt and r/s 6.15 appt to 6.20 per MD request..Marland KitchenMarland KitchenMarland Kitchenpt ok and aware

## 2015-04-29 ENCOUNTER — Ambulatory Visit (INDEPENDENT_AMBULATORY_CARE_PROVIDER_SITE_OTHER): Payer: Medicare Other | Admitting: Internal Medicine

## 2015-04-29 ENCOUNTER — Encounter: Payer: Self-pay | Admitting: Internal Medicine

## 2015-04-29 VITALS — BP 120/64 | HR 95 | Ht 59.5 in | Wt 121.4 lb

## 2015-04-29 DIAGNOSIS — J90 Pleural effusion, not elsewhere classified: Secondary | ICD-10-CM

## 2015-04-29 NOTE — Patient Instructions (Addendum)
You do not need a  re-tap now  but if breathing or pain get worse give [place] a call and we can set you up for procedure   You may eventually need a pleurex cather (stays in) or VATS/ pleuridesis if we can't control the tumor growth by chemotherapy > both are done by Dr Roxan Hockey

## 2015-04-29 NOTE — Progress Notes (Signed)
Subjective:    Patient ID: Sherry Craig, female    DOB: August 15, 1946,     MRN: [id number]  HPI  87 yobf sp smoking cessation 2011  Dr Wilburn Cornelia  2012 dx hypopharygeal ca >  RT 2012/2013 with recurrence June 2015 > 05/27/14 ct bx > chemo again until Jan 2016 then developed sob  while in [place] May 2016 > 04/10/15 R thoracentesis x 1700 cc > and referred to pulmonary clinic 04/29/2015 by Dr Alvy Bimler for sob      04/29/2015 1st [facility] office visit/ [facility]   Chief Complaint  Patient presents with  . Pulmonary Consult    Referred by Dr. Elson Areas. Pt c/o SOB for the past 6 wks-went to UC while in [facility] and was dxed with pleural effusion. Thoracentesis was done. She states that her breathing is not much better since then. She also c/o right side back pain.   feeling better on oxy 5 q 4h/ able lie flat, otherwise can't due more to pain than sob  Was walking 6 mile per day / now unless take oxy just goes to room to room or gets sob  Breathing better since the fluid removed, no cytology report in epic.  No obvious day to day or daytime variabilty or assoc chronic cough or cp or chest tightness, subjective wheeze overt sinus or hb symptoms. No unusual exp hx or h/o childhood pna/ asthma or knowledge of premature birth.  Sleeping ok without nocturnal  or early am exacerbation  of respiratory  c/o's or need for noct saba. Also denies any obvious fluctuation of symptoms with weather or environmental changes or other aggravating or alleviating factors except as outlined above   Current Medications, Allergies, Complete Past Medical History, Past Surgical History, Family History, and Social History were reviewed in [employer] record.             Review of Systems  Constitutional: Negative for fever, chills and unexpected weight change.  HENT: Negative for congestion, dental problem, ear pain, nosebleeds, postnasal drip, rhinorrhea, sinus pressure,  sneezing, sore throat, trouble swallowing and voice change.   Eyes: Negative for visual disturbance.  Respiratory: Positive for cough and shortness of breath. Negative for choking.   Cardiovascular: Negative for chest pain and leg swelling.  Gastrointestinal: Negative for vomiting, abdominal pain and diarrhea.  Genitourinary: Negative for difficulty urinating.  Musculoskeletal: Negative for arthralgias.  Skin: Negative for rash.  Neurological: Negative for tremors, syncope and headaches.  Hematological: Does not bruise/bleed easily.       Objective:   Physical Exam  amb pleasant bf nad  Wt Readings from Last 3 Encounters:  04/29/15 121 lb 6.4 oz (55.067 kg)  04/23/15 122 lb 6.4 oz (55.52 kg)  02/06/15 134 lb 8 oz (61.009 kg)    Vital signs reviewed  HEENT: nl dentition, turbinates, and orophanx. Nl external ear canals without cough reflex   NECK :  without JVD/Nodes/TM/ nl carotid upstrokes bilaterally   LUNGS: no acc muscle use,dullness and decreased bs inferior  Third  of right chest    CV:  RRR  no s3 or murmur or increase in P2, no edema   ABD:  soft and nontender with nl excursion in the supine position. No bruits or organomegaly, bowel sounds nl  MS:  warm without deformities, calf tenderness, cyanosis or clubbing  SKIN: warm and dry without lesions    NEURO:  alert, approp, no deficits     I  personally reviewed images and agree with radiology impression as follows:  CXR:  04/23/15 1. Moderate right pleural effusion. Right perihilar and right basilar parenchymal lung opacities may represent a combination of atelectasis and metastases. Infection is not excluded. 2. Similar size of left lower lobe metastasis.       Assessment & Plan:

## 2015-04-30 ENCOUNTER — Encounter: Payer: Self-pay | Admitting: Internal Medicine

## 2015-04-30 ENCOUNTER — Ambulatory Visit: Payer: Self-pay | Admitting: Hematology and Oncology

## 2015-04-30 ENCOUNTER — Encounter: Payer: Self-pay | Admitting: Nutrition

## 2015-04-30 NOTE — Assessment & Plan Note (Signed)
04/10/15 [place] R Tap x 1700cc> cyt requested   This is undoubtedly malignant whether cytology pos or not as has no other clinical features to suggest otherwise  I had an extended discussion with the patient and husband reviewing all relevant studies completed to date lasting 78 m   Discussed in detail all the  indications, usual  risks and alternatives  relative to the benefits with patient related to management of malignant effusions. Since she has failed multiple modalities to control the cancer, the effusion is a challenge in terms of how best to a palliate it but for now oxycodone seems adequated.  Will eventually need retap/ pleurex/ or VATS which I would be happy to help arrange when symptoms worsen  Each maintenance medication was reviewed in detail including most importantly the difference between maintenance and as needed and under what circumstances the prns are to be used.  Please see instructions for details which were reviewed in writing and the patient given a copy.

## 2015-05-02 ENCOUNTER — Ambulatory Visit (HOSPITAL_COMMUNITY)
Admission: RE | Admit: 2015-05-02 | Discharge: 2015-05-02 | Disposition: A | Discharge status: Home / Self Care | Payer: Medicare Other | Source: Ambulatory Visit | Attending: Hematology and Oncology | Admitting: Hematology and Oncology

## 2015-05-02 DIAGNOSIS — C139 Malignant neoplasm of hypopharynx, unspecified: Secondary | ICD-10-CM | POA: Diagnosis present

## 2015-05-02 LAB — GLUCOSE, CAPILLARY: GLUCOSE-CAPILLARY: 87 mg/dL (ref 65–99)

## 2015-05-02 MED ORDER — FLUDEOXYGLUCOSE F - 18 (FDG) INJECTION
6.8000 | Freq: Once | INTRAVENOUS | Status: AC | PRN
  Administered 2015-05-02: 6.8 via INTRAVENOUS

## 2015-05-05 ENCOUNTER — Encounter: Payer: Self-pay | Admitting: Hematology and Oncology

## 2015-05-05 ENCOUNTER — Ambulatory Visit: Payer: Medicare Other | Admitting: Nutrition

## 2015-05-05 ENCOUNTER — Other Ambulatory Visit (HOSPITAL_BASED_OUTPATIENT_CLINIC_OR_DEPARTMENT_OTHER): Payer: Medicare Other

## 2015-05-05 ENCOUNTER — Ambulatory Visit (HOSPITAL_BASED_OUTPATIENT_CLINIC_OR_DEPARTMENT_OTHER): Payer: Medicare Other | Admitting: Hematology and Oncology

## 2015-05-05 ENCOUNTER — Telehealth: Payer: Self-pay | Admitting: Hematology and Oncology

## 2015-05-05 VITALS — BP 139/67 | HR 96 | Temp 98.1°F | Resp 18 | Ht 59.5 in | Wt 120.3 lb

## 2015-05-05 DIAGNOSIS — C139 Malignant neoplasm of hypopharynx, unspecified: Secondary | ICD-10-CM

## 2015-05-05 DIAGNOSIS — J90 Pleural effusion, not elsewhere classified: Secondary | ICD-10-CM

## 2015-05-05 DIAGNOSIS — T451X5A Adverse effect of antineoplastic and immunosuppressive drugs, initial encounter: Secondary | ICD-10-CM

## 2015-05-05 DIAGNOSIS — D630 Anemia in neoplastic disease: Secondary | ICD-10-CM

## 2015-05-05 DIAGNOSIS — G620 Drug-induced polyneuropathy: Secondary | ICD-10-CM

## 2015-05-05 DIAGNOSIS — K1231 Oral mucositis (ulcerative) due to antineoplastic therapy: Secondary | ICD-10-CM

## 2015-05-05 DIAGNOSIS — C7951 Secondary malignant neoplasm of bone: Secondary | ICD-10-CM | POA: Diagnosis not present

## 2015-05-05 DIAGNOSIS — E46 Unspecified protein-calorie malnutrition: Secondary | ICD-10-CM

## 2015-05-05 LAB — COMPREHENSIVE METABOLIC PANEL (CC13)
ALT: 8 U/L (ref 0–55)
AST: 16 U/L (ref 5–34)
Albumin: 3.1 g/dL — ABNORMAL LOW (ref 3.5–5.0)
Alkaline Phosphatase: 85 U/L (ref 40–150)
Anion Gap: 9 mEq/L (ref 3–11)
BUN: 13.9 mg/dL (ref 7.0–26.0)
CHLORIDE: 101 meq/L (ref 98–109)
CO2: 29 meq/L (ref 22–29)
Calcium: 10 mg/dL (ref 8.4–10.4)
Creatinine: 0.8 mg/dL (ref 0.6–1.1)
EGFR: >90 mL/min/{1.73_m2} (ref >90)
GLUCOSE: 93 mg/dL (ref 70–140)
POTASSIUM: 4.2 meq/L (ref 3.5–5.1)
SODIUM: 139 meq/L (ref 136–145)
Total Bilirubin: 0.29 mg/dL (ref 0.20–1.20)
Total Protein: 7.3 g/dL (ref 6.4–8.3)

## 2015-05-05 LAB — CBC WITH DIFFERENTIAL/PLATELET
BASO%: 0.7 % (ref 0.0–2.0)
BASOS ABS: 0 10*3/uL (ref 0.0–0.1)
EOS ABS: 0.1 10*3/uL (ref 0.0–0.5)
EOS%: 1.1 % (ref 0.0–7.0)
HCT: 31 % — ABNORMAL LOW (ref 34.8–46.6)
HGB: 9.9 g/dL — ABNORMAL LOW (ref 11.6–15.9)
LYMPH%: 16.9 % (ref 14.0–49.7)
MCH: 28.5 pg (ref 25.1–34.0)
MCHC: 31.9 g/dL (ref 31.5–36.0)
MCV: 89.6 fL (ref 79.5–101.0)
MONO#: 0.6 10*3/uL (ref 0.1–0.9)
MONO%: 9.4 % (ref 0.0–14.0)
NEUT#: 4.2 10*3/uL (ref 1.5–6.5)
NEUT%: 71.9 % (ref 38.4–76.8)
PLATELETS: 444 10*3/uL — AB (ref 145–400)
RBC: 3.46 10*6/uL — AB (ref 3.70–5.45)
RDW: 15.4 % — ABNORMAL HIGH (ref 11.2–14.5)
WBC: 5.9 10*3/uL (ref 3.9–10.3)
lymph#: 1 10*3/uL (ref 0.9–3.3)

## 2015-05-05 LAB — PROTIME-INR
INR: 1.1 — AB (ref 2.00–3.50)
PROTIME: 13.2 s (ref 10.6–13.4)

## 2015-05-05 MED ORDER — OXYCODONE HCL 5 MG PO TABS: 5.0000 mg | ORAL_TABLET | ORAL | Status: DC | PRN

## 2015-05-05 NOTE — Assessment & Plan Note (Addendum)
She is symptomatic. As above, I will order therapeutic thoracentesis. If her recurrent pleural effusion is frequent requiring more than twice thoracentesis in the month, I will refer her to pulmonologist for pleurodesis.

## 2015-05-05 NOTE — Assessment & Plan Note (Signed)
This is minor, grade 1 from prior treatment. 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 I will consider dose adjustment in the future if she had worsening neuropathy.

## 2015-05-05 NOTE — Progress Notes (Signed)
Nutrition follow-up completed with 69 year old female with metastatic hypopharyngeal cancer. Weight documented as 120.3 pounds with BMI 23.9. Patient reports she has cut out sweets and concentrated forms of sugar resulting in some weight loss. Patient continues to drink 2-3 bottles of boost plus a day. She is able to teach back sources of protein in her diet and the importance of small frequent meals and snacks.  I will continue to work with patient throughout treatment as needed.  She has my contact information for questions and concerns.

## 2015-05-05 NOTE — Assessment & Plan Note (Signed)
PET CT scan confirm significant disease progression. She is symptomatic from pleural effusion. I will order therapeutic thoracentesis of the right lung for symptomatic relief prior to treatment Her appointment for second opinion at [facility] is on 05/14/2015. I discussed with her current NCCN guidelines. My preference choice for treatment for her would be weekly combination carboplatin and Paclitaxel. I will try 3 weeks on, 1 week off regimen. We discussed the role of chemotherapy. The intent is for palliative.  We discussed some of the risks, benefits, side-effects of Taxol/carboplatin. Some of the short term side-effects included, though not limited to, including weight loss, life threatening infections, risk of allergic reactions, need for transfusions of blood products, nausea, vomiting, change in bowel habits, loss of hair, admission to hospital for various reasons, and risks of death.   Long term side-effects are also discussed including risks of infertility, permanent damage to nerve function, hearing loss, chronic fatigue, kidney damage with possibility needing hemodialysis, and rare secondary malignancy including bone marrow disorders.  The patient is aware that the response rates discussed earlier is not guaranteed.  After a long discussion, patient made an informed decision to proceed with the prescribed plan of care and went ahead to sign the consent form today.   Patient education material was dispensed.  I also shared with her recent clinical trial.  CheckMate-141 was a randomized, phase III clinical trial designed to determine whether the PD-1 inhibitor nivolumab could extend overall survival for patients with platinum-refractory recurrent or metastatic head and neck squamous cell carcinoma compared with treatment of the investigator's choice, which was any of the commonly used therapeutics docetaxel, methotrexate, or cetuximab. Of the 361 patients enrolled in the clinical  trial, 240 were randomly assigned to nivolumab and 121 to single-agent chemotherapy of investigator's choice. At the interim analysis, which was conducted after 218 events, patients assigned to nivolumab were found to have a 30 percent reduction in risk of death compared with those assigned therapy of investigator's choice. Median overall survival was 7.5 months for those assigned nivolumab versus 5.1 months for those assigned therapy of investigator's choice. At 12 months, 36 percent of the patients treated with nivolumab were alive compared with 17 percent of those assigned therapy of investigator's choice. In that clinical trial, the comparison was made with single agent chemotherapy of investigator choice. For her, with her young age and good performance status, I felt that she could probably tolerate combination therapy. Also, Nivolumab has not been FDA approved for use right now. Hopefully, it will be approved in the near future for treatment.

## 2015-05-05 NOTE — Assessment & Plan Note (Signed)
She has minor bone pain, controlled with taking oxycodone. I refill her prescription today. If the pain is severe and is not responsive to palliative chemotherapy, I might have to consult radiation oncologist for palliative XRT.

## 2015-05-05 NOTE — Assessment & Plan Note (Signed)
This is likely anemia of chronic disease or related to bone mets. The patient denies recent history of bleeding such as epistaxis, hematuria or hematochezia. She is asymptomatic from the anemia. We will observe for now.  She does not require transfusion now.

## 2015-05-05 NOTE — Telephone Encounter (Signed)
Gave adn printed avs for pt for June and July

## 2015-05-05 NOTE — Progress Notes (Signed)
[facility] OFFICE PROGRESS NOTE  Patient Care Team: Lester Kinsman, PA-C as PCP - General (Physician Assistant) Thea Silversmith, MD (Radiation Oncology) Jerrell Belfast, MD (Otolaryngology) Heath Lark, MD as Consulting Physician (Hematology and Oncology) Leota Sauers, RN as Oncology Nurse Navigator (Oncology)  SUMMARY OF ONCOLOGIC HISTORY: Oncology History   Cancer of hypopharynx, base of tongue, squamous cell carcinoma, HPV focally positive   Primary site: Pharynx - Hypopharynx   Staging method: AJCC 7th Edition   Clinical: Stage III (T2, N1, M0) signed by Thea Silversmith, MD on 10/07/2011 11:50 AM   Summary: Stage III (T2, N1, M0)       Cancer of hypopharynx   07/21/2011 Pathology Results Neck fine needle aspirate was negative for malignancy.   09/03/2011 Pathology Results Right neck fine needle aspirate and biopsy confirmed malignancy, squamous cell carcinoma, HPV marginally positive.   09/30/2011 Imaging PET CT scan confirmed right hypopharynx mass with regional lymphadenopathy.   10/28/2011 - 12/17/2011 Radiation Therapy The patient completed radiation therapy.   10/28/2011 - 12/23/2011 Chemotherapy The patient received 3 doses of high dose cisplatin. Her last dose of chemotherapy have 25% dose adjustment due to side effects.   03/16/2012 Imaging Repeat PET scan confirmed near complete response to treatment.   05/23/2012 Imaging Repeat PET/CT scan confirmed recurrence of disease in the right neck lymph node.   06/14/2012 Pathology Results Pathology confirmed only one lymph node involvement.   06/14/2012 Surgery The patient underwent right neck dissection for residual and recurrence of disease.   04/17/2014 Imaging Chest x-ray show multiple pulmonary nodules. CT scan of the neck showed no evidence of recurrence of disease.   04/18/2014 Imaging CT chest confirmed multiple pulmonary nodules highly suspicious for metastatic disease.   05/21/2014 Imaging PET CT scan showed multiple  pulmonary nodules which are hypermetabolic.   05/27/2014 Procedure [id number] CT-guided biopsy confirms malignant cells, compatible with squamous cell carcinoma.   06/13/2014 - 10/31/2014 Chemotherapy She completed 6 cycles of palliative treatment with carboplatin, 5-FU and Erbitux   07/25/2014 Adverse Reaction Cycle 3 chemo is held and delayed due to leukopenia   08/20/2014 Imaging CT scan showed partial response to treatment.   11/13/2014 Imaging PET CT scan showed stable disease   11/14/2014 - 01/23/2015 Chemotherapy She received maintenance Erbitux every 2 weeks   02/05/2015 Imaging PET CT scan showed disease progression with new lymphadenopathy and new bone lesions.   04/10/2015 - 04/11/2015 Hospital Admission She was admited to an outside hospital for thoracentesis for large pleural effusion. 1700 ml was removed from the right side with malignant cytology   04/23/2015 Imaging CXR showed moderate right lung effusion   05/02/2015 Imaging PET scan showed widespread lymphagitic spread, moderate effusion and adrenal metastasis    Metastasis to bone    INTERVAL HISTORY: Please see below for problem oriented charting. She feels short of breath on minimal exertion. Her right rib pain she is well controlled with oxycodone. She denies nausea or constipation. Appointment at [facility] is pending for next week.  REVIEW OF SYSTEMS:   Constitutional: Denies fevers, chills or abnormal weight loss Eyes: Denies blurriness of vision Ears, nose, mouth, throat, and face: Denies mucositis or sore throat Cardiovascular: Denies palpitation, chest discomfort or lower extremity swelling Gastrointestinal:  Denies nausea, heartburn or change in bowel habits Skin: Denies abnormal skin rashes Lymphatics: Denies new lymphadenopathy or easy bruising Neurological:Denies numbness, tingling or new weaknesses Behavioral/Psych: Mood is stable, no new changes  All other systems were reviewed with the  patient and are  negative.  I have reviewed the past medical history, past surgical history, social history and family history with the patient and they are unchanged from previous note.  ALLERGIES:  has No Known Allergies.  MEDICATIONS:  Current Outpatient Prescriptions  Medication Sig Dispense Refill  . calcium carbonate (OS-CAL) 1250 MG chewable tablet Chew 1 tablet by mouth daily.     . Cholecalciferol (VITAMIN D) 2000 UNITS CAPS Take 2,000 Units by mouth daily.     Marland Kitchen docusate sodium (COLACE) 100 MG capsule Take 1 capsule (100 mg total) by mouth 2 (two) times daily as needed for mild constipation (keep stool soft). 60 capsule 3  . fexofenadine-pseudoephedrine (ALLEGRA-D 24) 180-240 MG per 24 hr tablet Take 1 tablet by mouth daily as needed (for allergies).     . hydrocortisone (ANUSOL-HC) 2.5 % rectal cream Place 1 application rectally 2 (two) times daily. Use on hemorrhoids as needed for one week.  Call MD if not better in one week. 30 g 0  . hydrocortisone 1 % ointment Apply 1 application topically 2 (two) times daily. 30 g 0  . Omega-3 Fatty Acids (OMEGA-3 FISH OIL PO) Take 1,280 mg by mouth daily.    Marland Kitchen omeprazole (PRILOSEC) 40 MG capsule Take 40 mg by mouth daily as needed (Acid reflux).     Marland Kitchen OVER THE COUNTER MEDICATION Take 1 tablet by mouth daily. MED NAME: Vitamin B-17    . oxyCODONE (OXY IR/ROXICODONE) 5 MG immediate release tablet Take 1 tablet (5 mg total) by mouth every 4 (four) hours as needed for severe pain. 90 tablet 0  . Polyethyl Glycol-Propyl Glycol (SYSTANE OP) Place 1 drop into both eyes 3 (three) times daily as needed (for allergies).     . pravastatin (PRAVACHOL) 40 MG tablet Take 40 mg by mouth daily.    . Vitamin D, Ergocalciferol, (DRISDOL) 50000 UNITS CAPS capsule Take 50,000 Units by mouth once a week.  0  . Alum & Mag Hydroxide-Simeth (MAGIC MOUTHWASH) SOLN Take 5 mLs by mouth 4 (four) times daily as needed for mouth pain. (Patient not taking: Reported on 05/05/2015) 240 mL 0  .  lidocaine-prilocaine (EMLA) cream Apply 1 application topically as needed. (Patient not taking: Reported on 05/05/2015) 30 g 6   No current facility-administered medications for this visit.    PHYSICAL EXAMINATION: ECOG PERFORMANCE STATUS: 2 - Symptomatic, <50% confined to bed  Filed Vitals:   05/05/15 1413  BP: 139/67  Pulse: 96  Temp: 98.1 F (36.7 C)  Resp: 18   Filed Weights   05/05/15 1413  Weight: 120 lb 4.8 oz (54.568 kg)    GENERAL:alert, no distress and comfortable SKIN: skin color, texture, turgor are normal, no rashes or significant lesions EYES: normal, Conjunctiva are pink and non-injected, sclera clear OROPHARYNX:no exudate, no erythema and lips, buccal mucosa, and tongue normal  Musculoskeletal:no cyanosis of digits and no clubbing  NEURO: alert & oriented x 3 with fluent speech, no focal motor/sensory deficits  LABORATORY DATA:  I have reviewed the data as listed    Component Value Date/Time   NA 138 04/23/2015 1007   NA 140 06/07/2012 1241   K 3.8 04/23/2015 1007   K 3.9 06/07/2012 1241   CL 103 04/17/2013 0803   CL 103 06/07/2012 1241   CO2 28 04/23/2015 1007   CO2 28 06/07/2012 1241   GLUCOSE 94 04/23/2015 1007   GLUCOSE 78 04/17/2013 0803   GLUCOSE 91 06/07/2012 1241   BUN 9.0  04/23/2015 1007   BUN 16 06/07/2012 1241   CREATININE 0.7 04/23/2015 1007   CREATININE 1.05 06/07/2012 1241   CALCIUM 9.2 04/23/2015 1007   CALCIUM 9.3 06/07/2012 1241   PROT 6.8 04/23/2015 1007   PROT 6.3 03/17/2012 0824   ALBUMIN 2.5* 04/23/2015 1007   ALBUMIN 4.1 03/17/2012 0824   AST 16 04/23/2015 1007   AST 16 03/17/2012 0824   ALT 11 04/23/2015 1007   ALT <8 03/17/2012 0824   ALKPHOS 90 04/23/2015 1007   ALKPHOS 61 03/17/2012 0824   BILITOT 0.48 04/23/2015 1007   BILITOT 0.2* 03/17/2012 0824   GFRNONAA 55* 06/07/2012 1241   GFRAA 64* 06/07/2012 1241    No results found for: SPEP, UPEP  Lab Results  Component Value Date   WBC 7.0 04/23/2015    NEUTROABS 4.8 04/23/2015   HGB 9.5* 04/23/2015   HCT 29.3* 04/23/2015   MCV 91.0 04/23/2015   PLT 401* 04/23/2015      Chemistry      Component Value Date/Time   NA 138 04/23/2015 1007   NA 140 06/07/2012 1241   K 3.8 04/23/2015 1007   K 3.9 06/07/2012 1241   CL 103 04/17/2013 0803   CL 103 06/07/2012 1241   CO2 28 04/23/2015 1007   CO2 28 06/07/2012 1241   BUN 9.0 04/23/2015 1007   BUN 16 06/07/2012 1241   CREATININE 0.7 04/23/2015 1007   CREATININE 1.05 06/07/2012 1241      Component Value Date/Time   CALCIUM 9.2 04/23/2015 1007   CALCIUM 9.3 06/07/2012 1241   ALKPHOS 90 04/23/2015 1007   ALKPHOS 61 03/17/2012 0824   AST 16 04/23/2015 1007   AST 16 03/17/2012 0824   ALT 11 04/23/2015 1007   ALT <8 03/17/2012 0824   BILITOT 0.48 04/23/2015 1007   BILITOT 0.2* 03/17/2012 0824       RADIOGRAPHIC STUDIES: I reviewed the PET scan with her and her husband I have personally reviewed the radiological images as listed and agreed with the findings in the report.   ASSESSMENT & PLAN:  Cancer of hypopharynx PET CT scan confirm significant disease progression. She is symptomatic from pleural effusion. I will order therapeutic thoracentesis of the right lung for symptomatic relief prior to treatment Her appointment for second opinion at [facility] is on 05/14/2015. I discussed with her current NCCN guidelines. My preference choice for treatment for her would be weekly combination carboplatin and Paclitaxel. I will try 3 weeks on, 1 week off regimen. We discussed the role of chemotherapy. The intent is for palliative.  We discussed some of the risks, benefits, side-effects of Taxol/carboplatin. Some of the short term side-effects included, though not limited to, including weight loss, life threatening infections, risk of allergic reactions, need for transfusions of blood products, nausea, vomiting, change in bowel habits, loss of hair, admission to hospital for various  reasons, and risks of death.   Long term side-effects are also discussed including risks of infertility, permanent damage to nerve function, hearing loss, chronic fatigue, kidney damage with possibility needing hemodialysis, and rare secondary malignancy including bone marrow disorders.  The patient is aware that the response rates discussed earlier is not guaranteed.  After a long discussion, patient made an informed decision to proceed with the prescribed plan of care and went ahead to sign the consent form today.   Patient education material was dispensed.  I also shared with her recent clinical trial.  CheckMate-141 was a randomized, phase III clinical  trial designed to determine whether the PD-1 inhibitor nivolumab could extend overall survival for patients with platinum-refractory recurrent or metastatic head and neck squamous cell carcinoma compared with treatment of the investigator's choice, which was any of the commonly used therapeutics docetaxel, methotrexate, or cetuximab. Of the 361 patients enrolled in the clinical trial, 240 were randomly assigned to nivolumab and 121 to single-agent chemotherapy of investigator's choice. At the interim analysis, which was conducted after 218 events, patients assigned to nivolumab were found to have a 30 percent reduction in risk of death compared with those assigned therapy of investigator's choice. Median overall survival was 7.5 months for those assigned nivolumab versus 5.1 months for those assigned therapy of investigator's choice. At 12 months, 36 percent of the patients treated with nivolumab were alive compared with 17 percent of those assigned therapy of investigator's choice. In that clinical trial, the comparison was made with single agent chemotherapy of investigator choice. For her, with her young age and good performance status, I felt that she could probably tolerate combination therapy. Also, Nivolumab has not been FDA approved for use  right now. Hopefully, it will be approved in the near future for treatment.   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 The patient denies recent history of bleeding such as epistaxis, hematuria or hematochezia. She is asymptomatic from the anemia. We will observe for now.  She does not require transfusion now.      Metastasis to bone She has minor bone pain, controlled with taking oxycodone. I refill her prescription today. If the pain is severe and is not responsive to palliative chemotherapy, I might have to consult radiation oncologist for palliative XRT.  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 As above, I will order therapeutic thoracentesis. If her recurrent pleural effusion is frequent requiring more than twice thoracentesis in the month, I will refer her to pulmonologist for pleurodesis.  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 I will consider dose adjustment in the future if she had worsening neuropathy.  Protein calorie malnutrition This is moderate to severe. Albumin level is low and she has lost 12 pounds in 3 months Her appetite has improved since recent thoracentesis. I recommend increase oral intake as tolerated I will also consult our dietitian to follow    Orders Placed This Encounter  Procedures  . US Thoracentesis Asp Pleural space w/IMG guide    Standing Status: Future     Number of Occurrences:      Standing Expiration Date: 05/04/2016    Order Specific Question:  Are labs required for specimen collection?    Answer:  No    Order Specific Question:  Reason for Exam (SYMPTOM  OR DIAGNOSIS REQUIRED)    Answer:  therapeutic thoracentesis    Order Specific Question:  Preferred imaging location?    Answer:  [place]    Standing Status: Future     Number of Occurrences: 1      Standing Expiration Date: 06/08/2016   All questions were answered. The patient knows to call the clinic with any problems, questions or concerns. No barriers to learning was detected. I spent 30 minutes counseling the patient face to face. The total time spent in the appointment was 40 minutes and more than 50% was on counseling and review of test results     [facility],  Jaselle Pryer, MD 05/05/2015 3:28 PM

## 2015-05-05 NOTE — Assessment & Plan Note (Signed)
This is moderate to severe. Albumin level is low and she has lost 12 pounds in 3 months Her appetite has improved since recent thoracentesis. I recommend increase oral intake as tolerated I will also consult our dietitian to follow

## 2015-05-09 ENCOUNTER — Ambulatory Visit (HOSPITAL_COMMUNITY)
Admission: RE | Admit: 2015-05-09 | Discharge: 2015-05-09 | Disposition: A | Discharge status: Home / Self Care | Payer: Medicare Other | Source: Ambulatory Visit | Attending: Hematology and Oncology | Admitting: Hematology and Oncology

## 2015-05-09 ENCOUNTER — Ambulatory Visit (HOSPITAL_COMMUNITY)
Admission: RE | Admit: 2015-05-09 | Discharge: 2015-05-09 | Disposition: A | Discharge status: Home / Self Care | Payer: Medicare Other | Source: Ambulatory Visit | Attending: Radiology | Admitting: Radiology

## 2015-05-09 DIAGNOSIS — Z85818 Personal history of malignant neoplasm of other sites of lip, oral cavity, and pharynx: Secondary | ICD-10-CM | POA: Insufficient documentation

## 2015-05-09 DIAGNOSIS — J90 Pleural effusion, not elsewhere classified: Secondary | ICD-10-CM | POA: Diagnosis present

## 2015-05-09 DIAGNOSIS — R918 Other nonspecific abnormal finding of lung field: Secondary | ICD-10-CM | POA: Diagnosis not present

## 2015-05-09 DIAGNOSIS — Z9889 Other specified postprocedural states: Secondary | ICD-10-CM

## 2015-05-09 NOTE — Procedures (Signed)
US guided right thoracentesis attempted ; however, only few cc's of dark bloody fluid were obtained; collection was small and multiloculated. F/u CXR pending. No immediate complications.

## 2015-05-12 ENCOUNTER — Other Ambulatory Visit: Payer: Self-pay

## 2015-05-14 ENCOUNTER — Telehealth: Payer: Self-pay | Admitting: *Deleted

## 2015-05-14 ENCOUNTER — Other Ambulatory Visit: Payer: Self-pay | Admitting: *Deleted

## 2015-05-14 MED ORDER — ONDANSETRON HCL 8 MG PO TABS: 8.0000 mg | ORAL_TABLET | Freq: Three times a day (TID) | ORAL | Status: AC | PRN

## 2015-05-14 MED ORDER — PROCHLORPERAZINE MALEATE 10 MG PO TABS: 10.0000 mg | ORAL_TABLET | Freq: Four times a day (QID) | ORAL | Status: AC | PRN

## 2015-05-14 NOTE — Telephone Encounter (Signed)
TC from patient. She states she will be starting chemo therapy on Thursday, 05/15/15.  She has 2 old prescriptions for ondansetron and prochlorperazine (they expire 05/2015)  She is requesting new prescriptions for those since she will be starting chemo on Thursday. Also pt did have her appt @ [facility] today and she states that the doctor there agreed with current treatment plan.

## 2015-05-14 NOTE — Telephone Encounter (Signed)
PLs refill both with 60 tabs, 3 refills

## 2015-05-14 NOTE — Telephone Encounter (Signed)
Informed pt of zofran and compazine Rx sent to her pharmacy as requested. She verbalized understanding.

## 2015-05-15 ENCOUNTER — Ambulatory Visit: Payer: Self-pay

## 2015-05-15 ENCOUNTER — Ambulatory Visit (HOSPITAL_BASED_OUTPATIENT_CLINIC_OR_DEPARTMENT_OTHER): Payer: Medicare Other

## 2015-05-15 VITALS — BP 135/79 | HR 92 | Temp 98.2°F | Resp 20

## 2015-05-15 DIAGNOSIS — C7951 Secondary malignant neoplasm of bone: Secondary | ICD-10-CM | POA: Diagnosis not present

## 2015-05-15 DIAGNOSIS — C139 Malignant neoplasm of hypopharynx, unspecified: Secondary | ICD-10-CM

## 2015-05-15 DIAGNOSIS — J90 Pleural effusion, not elsewhere classified: Secondary | ICD-10-CM

## 2015-05-15 DIAGNOSIS — Z5111 Encounter for antineoplastic chemotherapy: Secondary | ICD-10-CM | POA: Diagnosis not present

## 2015-05-15 LAB — COMPREHENSIVE METABOLIC PANEL (CC13)
ALK PHOS: 77 U/L (ref 40–150)
ALT: 7 U/L (ref 0–55)
ANION GAP: 11 meq/L (ref 3–11)
AST: 14 U/L (ref 5–34)
Albumin: 3.1 g/dL — ABNORMAL LOW (ref 3.5–5.0)
BUN: 10.5 mg/dL (ref 7.0–26.0)
CO2: 26 meq/L (ref 22–29)
CREATININE: 0.8 mg/dL (ref 0.6–1.1)
Calcium: 10 mg/dL (ref 8.4–10.4)
Chloride: 102 mEq/L (ref 98–109)
EGFR: >90 mL/min/{1.73_m2} (ref >90)
Glucose: 103 mg/dl (ref 70–140)
POTASSIUM: 4 meq/L (ref 3.5–5.1)
Sodium: 138 mEq/L (ref 136–145)
TOTAL PROTEIN: 7.2 g/dL (ref 6.4–8.3)
Total Bilirubin: 0.53 mg/dL (ref 0.20–1.20)

## 2015-05-15 LAB — CBC WITH DIFFERENTIAL/PLATELET
BASO%: 0.3 % (ref 0.0–2.0)
Basophils Absolute: 0 10*3/uL (ref 0.0–0.1)
EOS%: 2.1 % (ref 0.0–7.0)
Eosinophils Absolute: 0.1 10*3/uL (ref 0.0–0.5)
HEMATOCRIT: 32.6 % — AB (ref 34.8–46.6)
HGB: 10.3 g/dL — ABNORMAL LOW (ref 11.6–15.9)
LYMPH#: 1 10*3/uL (ref 0.9–3.3)
LYMPH%: 17.3 % (ref 14.0–49.7)
MCH: 28.4 pg (ref 25.1–34.0)
MCHC: 31.6 g/dL (ref 31.5–36.0)
MCV: 89.8 fL (ref 79.5–101.0)
MONO#: 0.5 10*3/uL (ref 0.1–0.9)
MONO%: 8 % (ref 0.0–14.0)
NEUT%: 72.3 % (ref 38.4–76.8)
NEUTROS ABS: 4.2 10*3/uL (ref 1.5–6.5)
Platelets: 337 10*3/uL (ref 145–400)
RBC: 3.63 10*6/uL — AB (ref 3.70–5.45)
RDW: 15.7 % — ABNORMAL HIGH (ref 11.2–14.5)
WBC: 5.8 10*3/uL (ref 3.9–10.3)

## 2015-05-15 MED ORDER — DIPHENHYDRAMINE HCL 50 MG/ML IJ SOLN
50.0000 mg | Freq: Once | INTRAMUSCULAR | Status: AC
  Administered 2015-05-15: 50 mg via INTRAVENOUS

## 2015-05-15 MED ORDER — FAMOTIDINE IN NACL 20-0.9 MG/50ML-% IV SOLN
20.0000 mg | Freq: Once | INTRAVENOUS | Status: AC
  Administered 2015-05-15: 20 mg via INTRAVENOUS

## 2015-05-15 MED ORDER — CARBOPLATIN CHEMO INTRADERMAL TEST DOSE 100MCG/0.02ML
100.0000 ug | Freq: Once | INTRADERMAL | Status: AC
  Administered 2015-05-15: 100 ug via INTRADERMAL
  Filled 2015-05-15: qty 0.02

## 2015-05-15 MED ORDER — FAMOTIDINE IN NACL 20-0.9 MG/50ML-% IV SOLN
INTRAVENOUS | Status: AC
  Filled 2015-05-15: qty 50

## 2015-05-15 MED ORDER — PACLITAXEL CHEMO INJECTION 300 MG/50ML
80.0000 mg/m2 | Freq: Once | INTRAVENOUS | Status: AC
  Administered 2015-05-15: 68 mg via INTRAVENOUS
  Filled 2015-05-15: qty 20

## 2015-05-15 MED ORDER — HEPARIN SOD (PORK) LOCK FLUSH 100 UNIT/ML IV SOLN
500.0000 [IU] | Freq: Once | INTRAVENOUS | Status: AC | PRN
  Administered 2015-05-15: 500 [IU]
  Filled 2015-05-15: qty 5

## 2015-05-15 MED ORDER — DIPHENHYDRAMINE HCL 50 MG/ML IJ SOLN
INTRAMUSCULAR | Status: AC
  Filled 2015-05-15: qty 1

## 2015-05-15 MED ORDER — CARBOPLATIN CHEMO INJECTION 450 MG/45ML
145.4000 mg | Freq: Once | INTRAVENOUS | Status: AC
  Administered 2015-05-15: 150 mg via INTRAVENOUS
  Filled 2015-05-15: qty 15

## 2015-05-15 MED ORDER — SODIUM CHLORIDE 0.9 % IV SOLN
Freq: Once | INTRAVENOUS | Status: AC
  Administered 2015-05-15: 10:00:00 via INTRAVENOUS

## 2015-05-15 MED ORDER — SODIUM CHLORIDE 0.9 % IJ SOLN
10.0000 mL | INTRAMUSCULAR | Status: DC | PRN
  Administered 2015-05-15: 10 mL
  Filled 2015-05-15: qty 10

## 2015-05-15 MED ORDER — DEXAMETHASONE SODIUM PHOSPHATE 100 MG/10ML IJ SOLN
Freq: Once | INTRAMUSCULAR | Status: AC
  Administered 2015-05-15: 12:00:00 via INTRAVENOUS
  Filled 2015-05-15: qty 8

## 2015-05-15 NOTE — Patient Instructions (Signed)
[facility] Discharge Instructions for Patients Receiving Chemotherapy  Today you received the following chemotherapy agents Taxol and Carboplatin. To help prevent nausea and vomiting after your treatment, we encourage you to take your nausea medication as directed.  If you develop nausea and vomiting that is not controlled by your nausea medication, call the clinic.   BELOW ARE SYMPTOMS THAT SHOULD BE REPORTED IMMEDIATELY:  *FEVER GREATER THAN 100.5 F  *CHILLS WITH OR WITHOUT FEVER  NAUSEA AND VOMITING THAT IS NOT CONTROLLED WITH YOUR NAUSEA MEDICATION  *UNUSUAL SHORTNESS OF BREATH  *UNUSUAL BRUISING OR BLEEDING  TENDERNESS IN MOUTH AND THROAT WITH OR WITHOUT PRESENCE OF ULCERS  *URINARY PROBLEMS  *BOWEL PROBLEMS  UNUSUAL RASH Items with * indicate a potential emergency and should be followed up as soon as possible.  Feel free to call the clinic you have any questions or concerns. The clinic phone number is [phone].  Please show the CHEMO ALERT CARD at check-in to the Emergency Department and triage nurse.    

## 2015-05-21 ENCOUNTER — Telehealth: Payer: Self-pay | Admitting: Nutrition

## 2015-05-21 NOTE — Telephone Encounter (Signed)
Patient has requested information on where she can purchase boost plus. Contacted patient by phone and provided purchasing information. Suggested she could also order by the case at [facility] or her own personal pharmacy.

## 2015-05-22 ENCOUNTER — Ambulatory Visit (HOSPITAL_BASED_OUTPATIENT_CLINIC_OR_DEPARTMENT_OTHER): Payer: Medicare Other

## 2015-05-22 ENCOUNTER — Telehealth: Payer: Self-pay | Admitting: Hematology and Oncology

## 2015-05-22 ENCOUNTER — Encounter: Payer: Self-pay | Admitting: Hematology and Oncology

## 2015-05-22 ENCOUNTER — Ambulatory Visit (HOSPITAL_BASED_OUTPATIENT_CLINIC_OR_DEPARTMENT_OTHER): Payer: Medicare Other | Admitting: Hematology and Oncology

## 2015-05-22 ENCOUNTER — Ambulatory Visit: Payer: Medicare Other

## 2015-05-22 ENCOUNTER — Other Ambulatory Visit (HOSPITAL_BASED_OUTPATIENT_CLINIC_OR_DEPARTMENT_OTHER): Payer: Medicare Other

## 2015-05-22 VITALS — BP 123/60 | HR 91 | Temp 98.0°F | Resp 18 | Ht 59.5 in | Wt 116.4 lb

## 2015-05-22 DIAGNOSIS — J90 Pleural effusion, not elsewhere classified: Secondary | ICD-10-CM | POA: Diagnosis not present

## 2015-05-22 DIAGNOSIS — D630 Anemia in neoplastic disease: Secondary | ICD-10-CM | POA: Diagnosis not present

## 2015-05-22 DIAGNOSIS — C7951 Secondary malignant neoplasm of bone: Secondary | ICD-10-CM

## 2015-05-22 DIAGNOSIS — Z5111 Encounter for antineoplastic chemotherapy: Secondary | ICD-10-CM

## 2015-05-22 DIAGNOSIS — C139 Malignant neoplasm of hypopharynx, unspecified: Secondary | ICD-10-CM

## 2015-05-22 DIAGNOSIS — T451X5A Adverse effect of antineoplastic and immunosuppressive drugs, initial encounter: Secondary | ICD-10-CM

## 2015-05-22 DIAGNOSIS — G620 Drug-induced polyneuropathy: Secondary | ICD-10-CM

## 2015-05-22 DIAGNOSIS — Z95828 Presence of other vascular implants and grafts: Secondary | ICD-10-CM

## 2015-05-22 LAB — CBC WITH DIFFERENTIAL/PLATELET
BASO%: 0.7 % (ref 0.0–2.0)
Basophils Absolute: 0 10*3/uL (ref 0.0–0.1)
EOS%: 3.5 % (ref 0.0–7.0)
Eosinophils Absolute: 0.2 10*3/uL (ref 0.0–0.5)
HCT: 29.3 % — ABNORMAL LOW (ref 34.8–46.6)
HGB: 9.5 g/dL — ABNORMAL LOW (ref 11.6–15.9)
LYMPH%: 14.5 % (ref 14.0–49.7)
MCH: 28.5 pg (ref 25.1–34.0)
MCHC: 32.6 g/dL (ref 31.5–36.0)
MCV: 87.5 fL (ref 79.5–101.0)
MONO#: 0.6 10*3/uL (ref 0.1–0.9)
MONO%: 11.1 % (ref 0.0–14.0)
NEUT#: 3.9 10*3/uL (ref 1.5–6.5)
NEUT%: 70.2 % (ref 38.4–76.8)
Platelets: 387 10*3/uL (ref 145–400)
RBC: 3.35 10*6/uL — AB (ref 3.70–5.45)
RDW: 16.3 % — AB (ref 11.2–14.5)
WBC: 5.5 10*3/uL (ref 3.9–10.3)
lymph#: 0.8 10*3/uL — ABNORMAL LOW (ref 0.9–3.3)

## 2015-05-22 LAB — COMPREHENSIVE METABOLIC PANEL (CC13)
ALK PHOS: 83 U/L (ref 40–150)
ALT: 9 U/L (ref 0–55)
AST: 18 U/L (ref 5–34)
Albumin: 3.1 g/dL — ABNORMAL LOW (ref 3.5–5.0)
Anion Gap: 10 mEq/L (ref 3–11)
BILIRUBIN TOTAL: 0.3 mg/dL (ref 0.20–1.20)
BUN: 12.4 mg/dL (ref 7.0–26.0)
CO2: 25 mEq/L (ref 22–29)
Calcium: 10.1 mg/dL (ref 8.4–10.4)
Chloride: 105 mEq/L (ref 98–109)
Creatinine: 0.7 mg/dL (ref 0.6–1.1)
Glucose: 100 mg/dl (ref 70–140)
Potassium: 4.1 mEq/L (ref 3.5–5.1)
SODIUM: 139 meq/L (ref 136–145)
Total Protein: 6.8 g/dL (ref 6.4–8.3)

## 2015-05-22 MED ORDER — FAMOTIDINE IN NACL 20-0.9 MG/50ML-% IV SOLN
20.0000 mg | Freq: Once | INTRAVENOUS | Status: AC
  Administered 2015-05-22: 20 mg via INTRAVENOUS

## 2015-05-22 MED ORDER — DIPHENHYDRAMINE HCL 50 MG/ML IJ SOLN
INTRAMUSCULAR | Status: AC
  Filled 2015-05-22: qty 1

## 2015-05-22 MED ORDER — CARBOPLATIN CHEMO INTRADERMAL TEST DOSE 100MCG/0.02ML
100.0000 ug | Freq: Once | INTRADERMAL | Status: AC
  Administered 2015-05-22: 100 ug via INTRADERMAL
  Filled 2015-05-22: qty 0.01

## 2015-05-22 MED ORDER — SODIUM CHLORIDE 0.9 % IJ SOLN
10.0000 mL | INTRAMUSCULAR | Status: DC | PRN
  Administered 2015-05-22: 10 mL
  Filled 2015-05-22: qty 10

## 2015-05-22 MED ORDER — DIPHENHYDRAMINE HCL 50 MG/ML IJ SOLN
50.0000 mg | Freq: Once | INTRAMUSCULAR | Status: AC
  Administered 2015-05-22: 50 mg via INTRAVENOUS

## 2015-05-22 MED ORDER — FAMOTIDINE IN NACL 20-0.9 MG/50ML-% IV SOLN
INTRAVENOUS | Status: AC
  Filled 2015-05-22: qty 50

## 2015-05-22 MED ORDER — SODIUM CHLORIDE 0.9 % IJ SOLN
10.0000 mL | INTRAMUSCULAR | Status: DC | PRN
  Administered 2015-05-22: 10 mL via INTRAVENOUS
  Filled 2015-05-22: qty 10

## 2015-05-22 MED ORDER — HEPARIN SOD (PORK) LOCK FLUSH 100 UNIT/ML IV SOLN
500.0000 [IU] | Freq: Once | INTRAVENOUS | Status: AC | PRN
  Administered 2015-05-22: 500 [IU]
  Filled 2015-05-22: qty 5

## 2015-05-22 MED ORDER — SODIUM CHLORIDE 0.9 % IV SOLN
Freq: Once | INTRAVENOUS | Status: AC
  Administered 2015-05-22: 11:00:00 via INTRAVENOUS
  Filled 2015-05-22: qty 8

## 2015-05-22 MED ORDER — PACLITAXEL CHEMO INJECTION 300 MG/50ML
80.0000 mg/m2 | Freq: Once | INTRAVENOUS | Status: AC
  Administered 2015-05-22: 120 mg via INTRAVENOUS
  Filled 2015-05-22: qty 20

## 2015-05-22 MED ORDER — SODIUM CHLORIDE 0.9 % IV SOLN
Freq: Once | INTRAVENOUS | Status: AC
  Administered 2015-05-22: 10:00:00 via INTRAVENOUS

## 2015-05-22 MED ORDER — SODIUM CHLORIDE 0.9 % IV SOLN
145.4000 mg | Freq: Once | INTRAVENOUS | Status: AC
  Administered 2015-05-22: 150 mg via INTRAVENOUS
  Filled 2015-05-22: qty 15

## 2015-05-22 NOTE — Assessment & Plan Note (Signed)
She has minor bone pain, controlled with taking oxycodone. This is stable.

## 2015-05-22 NOTE — Assessment & Plan Note (Signed)
She tolerated treatment well without any side effects. I plan to give her 3 weeks on, one-week off treatment. I plan to see her back prior to cycle 2 day 1 treatment

## 2015-05-22 NOTE — Patient Instructions (Signed)

## 2015-05-22 NOTE — Patient Instructions (Signed)
[facility] Discharge Instructions for Patients Receiving Chemotherapy  Today you received the following chemotherapy agents: Taxol and Carboplatin.  To help prevent nausea and vomiting after your treatment, we encourage you to take your nausea medication: compazine 110 mg every 6 hours as needed and Zofran 8 mg every 8  Hours as needed.   If you develop nausea and vomiting that is not controlled by your nausea medication, call the clinic.   BELOW ARE SYMPTOMS THAT SHOULD BE REPORTED IMMEDIATELY:  *FEVER GREATER THAN 100.5 F  *CHILLS WITH OR WITHOUT FEVER  NAUSEA AND VOMITING THAT IS NOT CONTROLLED WITH YOUR NAUSEA MEDICATION  *UNUSUAL SHORTNESS OF BREATH  *UNUSUAL BRUISING OR BLEEDING  TENDERNESS IN MOUTH AND THROAT WITH OR WITHOUT PRESENCE OF ULCERS  *URINARY PROBLEMS  *BOWEL PROBLEMS  UNUSUAL RASH Items with * indicate a potential emergency and should be followed up as soon as possible.  Feel free to call the clinic you have any questions or concerns. The clinic phone number is [phone].  Please show the [place] at check-in to the Emergency Department and triage nurse.

## 2015-05-22 NOTE — Assessment & Plan Note (Signed)
This is likely anemia of chronic disease or related to bone mets. The patient denies recent history of bleeding such as epistaxis, hematuria or hematochezia. She is asymptomatic from the anemia. We will observe for now.  She does not require transfusion now.

## 2015-05-22 NOTE — Assessment & Plan Note (Signed)
Clinical examination show good lung expansion. She is not symptomatic. We will observe for now without the need for thoracentesis.

## 2015-05-22 NOTE — Progress Notes (Signed)
[facility] OFFICE PROGRESS NOTE  Patient Care Team: Lester Kinsman, PA-C as PCP - General (Physician Assistant) Thea Silversmith, MD (Radiation Oncology) Jerrell Belfast, MD (Otolaryngology) Heath Lark, MD as Consulting Physician (Hematology and Oncology) Leota Sauers, RN as Oncology Nurse Navigator (Oncology)  SUMMARY OF ONCOLOGIC HISTORY: Oncology History   Cancer of hypopharynx, base of tongue, squamous cell carcinoma, HPV focally positive   Primary site: Pharynx - Hypopharynx   Staging method: AJCC 7th Edition   Clinical: Stage III (T2, N1, M0) signed by Thea Silversmith, MD on 10/07/2011 11:50 AM   Summary: Stage III (T2, N1, M0)       Cancer of hypopharynx   07/21/2011 Pathology Results Neck fine needle aspirate was negative for malignancy.   09/03/2011 Pathology Results Right neck fine needle aspirate and biopsy confirmed malignancy, squamous cell carcinoma, HPV marginally positive.   09/30/2011 Imaging PET CT scan confirmed right hypopharynx mass with regional lymphadenopathy.   10/28/2011 - 12/17/2011 Radiation Therapy The patient completed radiation therapy.   10/28/2011 - 12/23/2011 Chemotherapy The patient received 3 doses of high dose cisplatin. Her last dose of chemotherapy have 25% dose adjustment due to side effects.   03/16/2012 Imaging Repeat PET scan confirmed near complete response to treatment.   05/23/2012 Imaging Repeat PET/CT scan confirmed recurrence of disease in the right neck lymph node.   06/14/2012 Pathology Results Pathology confirmed only one lymph node involvement.   06/14/2012 Surgery The patient underwent right neck dissection for residual and recurrence of disease.   04/17/2014 Imaging Chest x-ray show multiple pulmonary nodules. CT scan of the neck showed no evidence of recurrence of disease.   04/18/2014 Imaging CT chest confirmed multiple pulmonary nodules highly suspicious for metastatic disease.   05/21/2014 Imaging PET CT scan showed multiple  pulmonary nodules which are hypermetabolic.   05/27/2014 Procedure [id number] CT-guided biopsy confirms malignant cells, compatible with squamous cell carcinoma.   06/13/2014 - 10/31/2014 Chemotherapy She completed 6 cycles of palliative treatment with carboplatin, 5-FU and Erbitux   07/25/2014 Adverse Reaction Cycle 3 chemo is held and delayed due to leukopenia   08/20/2014 Imaging CT scan showed partial response to treatment.   11/13/2014 Imaging PET CT scan showed stable disease   11/14/2014 - 01/23/2015 Chemotherapy She received maintenance Erbitux every 2 weeks   02/05/2015 Imaging PET CT scan showed disease progression with new lymphadenopathy and new bone lesions.   04/10/2015 - 04/11/2015 Hospital Admission She was admited to an outside hospital for thoracentesis for large pleural effusion. 1700 ml was removed from the right side with malignant cytology   04/23/2015 Imaging CXR showed moderate right lung effusion   05/02/2015 Imaging PET scan showed widespread lymphagitic spread, moderate effusion and adrenal metastasis    Metastasis to bone    INTERVAL HISTORY: Please see below for problem oriented charting. She returns for further follow-up prior to her second treatment. She denies side effects from treatment. No worsening neuropathy. She denies worsening cough or shortness of breath. She had mild constipation from oxycodone, relief with laxatives. The bone pain appears to be under control with oxycodone 3 times a day as needed  REVIEW OF SYSTEMS:   Constitutional: Denies fevers, chills or abnormal weight loss Eyes: Denies blurriness of vision Ears, nose, mouth, throat, and face: Denies mucositis or sore throat Respiratory: Denies cough, dyspnea or wheezes Cardiovascular: Denies palpitation, chest discomfort or lower extremity swelling Skin: Denies abnormal skin rashes Lymphatics: Denies new lymphadenopathy or easy bruising Neurological:Denies numbness, tingling or new  weaknesses Behavioral/Psych: Mood is stable, no new changes  All other systems were reviewed with the patient and are negative.  I have reviewed the past medical history, past surgical history, social history and family history with the patient and they are unchanged from previous note.  ALLERGIES:  has No Known Allergies.  MEDICATIONS:  Current Outpatient Prescriptions  Medication Sig Dispense Refill  . Alum & Mag Hydroxide-Simeth (MAGIC MOUTHWASH) SOLN Take 5 mLs by mouth 4 (four) times daily as needed for mouth pain. 240 mL 0  . calcium carbonate (OS-CAL) 1250 MG chewable tablet Chew 1 tablet by mouth daily.     . Cholecalciferol (VITAMIN D) 2000 UNITS CAPS Take 2,000 Units by mouth daily.     Marland Kitchen docusate sodium (COLACE) 100 MG capsule Take 1 capsule (100 mg total) by mouth 2 (two) times daily as needed for mild constipation (keep stool soft). 60 capsule 3  . fexofenadine-pseudoephedrine (ALLEGRA-D 24) 180-240 MG per 24 hr tablet Take 1 tablet by mouth daily as needed (for allergies).     . hydrocortisone (ANUSOL-HC) 2.5 % rectal cream Place 1 application rectally 2 (two) times daily. Use on hemorrhoids as needed for one week.  Call MD if not better in one week. 30 g 0  . hydrocortisone 1 % ointment Apply 1 application topically 2 (two) times daily. 30 g 0  . lidocaine-prilocaine (EMLA) cream Apply 1 application topically as needed. 30 g 6  . Omega-3 Fatty Acids (OMEGA-3 FISH OIL PO) Take 1,280 mg by mouth daily.    Marland Kitchen omeprazole (PRILOSEC) 40 MG capsule Take 40 mg by mouth daily as needed (Acid reflux).     . ondansetron (ZOFRAN) 8 MG tablet Take 1 tablet (8 mg total) by mouth every 8 (eight) hours as needed for nausea or vomiting. 60 tablet 3  . OVER THE COUNTER MEDICATION Take 1 tablet by mouth daily. MED NAME: Vitamin B-17    . oxyCODONE (OXY IR/ROXICODONE) 5 MG immediate release tablet Take 1 tablet (5 mg total) by mouth every 4 (four) hours as needed for severe pain. 90 tablet 0  .  Polyethyl Glycol-Propyl Glycol (SYSTANE OP) Place 1 drop into both eyes 3 (three) times daily as needed (for allergies).     . pravastatin (PRAVACHOL) 40 MG tablet Take 40 mg by mouth daily.    . prochlorperazine (COMPAZINE) 10 MG tablet Take 1 tablet (10 mg total) by mouth every 6 (six) hours as needed for nausea or vomiting. 60 tablet 3  . Vitamin D, Ergocalciferol, (DRISDOL) 50000 UNITS CAPS capsule Take 50,000 Units by mouth once a week.  0   No current facility-administered medications for this visit.   Facility-Administered Medications Ordered in Other Visits  Medication Dose Route Frequency Provider Last Rate Last Dose  . heparin lock flush 100 unit/mL  500 Units Intracatheter Once PRN Heath Lark, MD      . sodium chloride 0.9 % injection 10 mL  10 mL Intracatheter PRN Heath Lark, MD        PHYSICAL EXAMINATION: ECOG PERFORMANCE STATUS: 0 - Asymptomatic  Filed Vitals:   05/22/15 0836  BP: 123/60  Pulse: 91  Temp: 98 F (36.7 C)  Resp: 18   Filed Weights   05/22/15 0836  Weight: 116 lb 6.4 oz (52.799 kg)    GENERAL:alert, no distress and comfortable SKIN: skin color, texture, turgor are normal, no rashes or significant lesions EYES: normal, Conjunctiva are pink and non-injected, sclera clear OROPHARYNX:no exudate, no erythema and lips,  buccal mucosa, and tongue normal  NECK: supple, thyroid normal size, non-tender, without nodularity LYMPH:  no palpable lymphadenopathy in the cervical, axillary or inguinal LUNGS: clear to auscultation and percussion with normal breathing effort. She has very minimum reduce pressures on the right lung, improved compared to prior visit HEART: regular rate & rhythm and no murmurs and no lower extremity edema ABDOMEN:abdomen soft, non-tender and normal bowel sounds Musculoskeletal:no cyanosis of digits and no clubbing  NEURO: alert & oriented x 3 with fluent speech, no focal motor/sensory deficits  LABORATORY DATA:  I have reviewed the  data as listed    Component Value Date/Time   NA 139 05/22/2015 0754   NA 140 06/07/2012 1241   K 4.1 05/22/2015 0754   K 3.9 06/07/2012 1241   CL 103 04/17/2013 0803   CL 103 06/07/2012 1241   CO2 25 05/22/2015 0754   CO2 28 06/07/2012 1241   GLUCOSE 100 05/22/2015 0754   GLUCOSE 78 04/17/2013 0803   GLUCOSE 91 06/07/2012 1241   BUN 12.4 05/22/2015 0754   BUN 16 06/07/2012 1241   CREATININE 0.7 05/22/2015 0754   CREATININE 1.05 06/07/2012 1241   CALCIUM 10.1 05/22/2015 0754   CALCIUM 9.3 06/07/2012 1241   PROT 6.8 05/22/2015 0754   PROT 6.3 03/17/2012 0824   ALBUMIN 3.1* 05/22/2015 0754   ALBUMIN 4.1 03/17/2012 0824   AST 18 05/22/2015 0754   AST 16 03/17/2012 0824   ALT 9 05/22/2015 0754   ALT <8 03/17/2012 0824   ALKPHOS 83 05/22/2015 0754   ALKPHOS 61 03/17/2012 0824   BILITOT 0.30 05/22/2015 0754   BILITOT 0.2* 03/17/2012 0824   GFRNONAA 55* 06/07/2012 1241   GFRAA 64* 06/07/2012 1241    No results found for: SPEP, UPEP  Lab Results  Component Value Date   WBC 5.5 05/22/2015   NEUTROABS 3.9 05/22/2015   HGB 9.5* 05/22/2015   HCT 29.3* 05/22/2015   MCV 87.5 05/22/2015   PLT 387 05/22/2015      Chemistry      Component Value Date/Time   NA 139 05/22/2015 0754   NA 140 06/07/2012 1241   K 4.1 05/22/2015 0754   K 3.9 06/07/2012 1241   CL 103 04/17/2013 0803   CL 103 06/07/2012 1241   CO2 25 05/22/2015 0754   CO2 28 06/07/2012 1241   BUN 12.4 05/22/2015 0754   BUN 16 06/07/2012 1241   CREATININE 0.7 05/22/2015 0754   CREATININE 1.05 06/07/2012 1241      Component Value Date/Time   CALCIUM 10.1 05/22/2015 0754   CALCIUM 9.3 06/07/2012 1241   ALKPHOS 83 05/22/2015 0754   ALKPHOS 61 03/17/2012 0824   AST 18 05/22/2015 0754   AST 16 03/17/2012 0824   ALT 9 05/22/2015 0754   ALT <8 03/17/2012 0824   BILITOT 0.30 05/22/2015 0754   BILITOT 0.2* 03/17/2012 0824       ASSESSMENT & PLAN:  Cancer of hypopharynx She tolerated treatment well without  any side effects. I plan to give her 3 weeks on, one-week off treatment. I plan to see her back prior to cycle 2 day 1 treatment  Anemia in neoplastic disease This is likely anemia of chronic disease or related to bone mets. The patient denies recent history of bleeding such as epistaxis, hematuria or hematochezia. She is asymptomatic from the anemia. We will observe for now.  She does not require transfusion now.        Metastasis to bone She  has minor bone pain, controlled with taking oxycodone. This is stable.  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 I will consider dose adjustment in the future if she had worsening neuropathy.    Recurrent pleural effusion on right Clinical examination show good lung expansion. She is not symptomatic. We will observe for now without the need for thoracentesis.   No orders of the defined types were placed in this encounter.   All questions were answered. The patient knows to call the clinic with any problems, questions or concerns. No barriers to learning was detected. I spent 25 minutes counseling the patient face to face. The total time spent in the appointment was 30 minutes and more than 50% was on counseling and review of test results     [facility], [place], MD 05/22/2015 1:02 PM

## 2015-05-22 NOTE — Telephone Encounter (Signed)
Gave and printed appt sched and avs for pt for July and Aug °

## 2015-05-22 NOTE — Assessment & Plan Note (Signed)
This is minor, grade 1 from prior treatment. 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 I will consider dose adjustment in the future if she had worsening neuropathy.

## 2015-05-29 ENCOUNTER — Other Ambulatory Visit: Payer: Self-pay | Admitting: *Deleted

## 2015-05-29 ENCOUNTER — Other Ambulatory Visit (HOSPITAL_BASED_OUTPATIENT_CLINIC_OR_DEPARTMENT_OTHER): Payer: Medicare Other

## 2015-05-29 ENCOUNTER — Ambulatory Visit: Payer: Medicare Other | Admitting: Nutrition

## 2015-05-29 ENCOUNTER — Ambulatory Visit: Payer: Medicare Other

## 2015-05-29 ENCOUNTER — Ambulatory Visit (HOSPITAL_BASED_OUTPATIENT_CLINIC_OR_DEPARTMENT_OTHER): Payer: Medicare Other

## 2015-05-29 VITALS — BP 131/61 | HR 91 | Temp 98.7°F | Resp 18

## 2015-05-29 DIAGNOSIS — Z5111 Encounter for antineoplastic chemotherapy: Secondary | ICD-10-CM

## 2015-05-29 DIAGNOSIS — C139 Malignant neoplasm of hypopharynx, unspecified: Secondary | ICD-10-CM

## 2015-05-29 DIAGNOSIS — C7951 Secondary malignant neoplasm of bone: Secondary | ICD-10-CM | POA: Diagnosis not present

## 2015-05-29 DIAGNOSIS — Z95828 Presence of other vascular implants and grafts: Secondary | ICD-10-CM

## 2015-05-29 DIAGNOSIS — J90 Pleural effusion, not elsewhere classified: Secondary | ICD-10-CM

## 2015-05-29 DIAGNOSIS — K1231 Oral mucositis (ulcerative) due to antineoplastic therapy: Secondary | ICD-10-CM

## 2015-05-29 LAB — CBC WITH DIFFERENTIAL/PLATELET
BASO%: 1.1 % (ref 0.0–2.0)
BASOS ABS: 0 10*3/uL (ref 0.0–0.1)
EOS%: 3.7 % (ref 0.0–7.0)
Eosinophils Absolute: 0.1 10*3/uL (ref 0.0–0.5)
HEMATOCRIT: 28.6 % — AB (ref 34.8–46.6)
HEMOGLOBIN: 9.3 g/dL — AB (ref 11.6–15.9)
LYMPH%: 17.2 % (ref 14.0–49.7)
MCH: 28.6 pg (ref 25.1–34.0)
MCHC: 32.5 g/dL (ref 31.5–36.0)
MCV: 87.9 fL (ref 79.5–101.0)
MONO#: 0.5 10*3/uL (ref 0.1–0.9)
MONO%: 13.3 % (ref 0.0–14.0)
NEUT#: 2.6 10*3/uL (ref 1.5–6.5)
NEUT%: 64.7 % (ref 38.4–76.8)
Platelets: 338 10*3/uL (ref 145–400)
RBC: 3.25 10*6/uL — ABNORMAL LOW (ref 3.70–5.45)
RDW: 16.3 % — AB (ref 11.2–14.5)
WBC: 4 10*3/uL (ref 3.9–10.3)
lymph#: 0.7 10*3/uL — ABNORMAL LOW (ref 0.9–3.3)

## 2015-05-29 LAB — COMPREHENSIVE METABOLIC PANEL (CC13)
ALT: 7 U/L (ref 0–55)
ANION GAP: 7 meq/L (ref 3–11)
AST: 13 U/L (ref 5–34)
Albumin: 3.2 g/dL — ABNORMAL LOW (ref 3.5–5.0)
Alkaline Phosphatase: 90 U/L (ref 40–150)
BUN: 11.5 mg/dL (ref 7.0–26.0)
CO2: 26 mEq/L (ref 22–29)
CREATININE: 0.7 mg/dL (ref 0.6–1.1)
Calcium: 9.7 mg/dL (ref 8.4–10.4)
Chloride: 105 mEq/L (ref 98–109)
Glucose: 107 mg/dl (ref 70–140)
Potassium: 4.5 mEq/L (ref 3.5–5.1)
Sodium: 138 mEq/L (ref 136–145)
Total Bilirubin: 0.29 mg/dL (ref 0.20–1.20)
Total Protein: 6.8 g/dL (ref 6.4–8.3)

## 2015-05-29 MED ORDER — SODIUM CHLORIDE 0.9 % IJ SOLN
10.0000 mL | INTRAMUSCULAR | Status: DC | PRN
  Administered 2015-05-29: 10 mL via INTRAVENOUS
  Filled 2015-05-29: qty 10

## 2015-05-29 MED ORDER — CARBOPLATIN CHEMO INTRADERMAL TEST DOSE 100MCG/0.02ML
100.0000 ug | Freq: Once | INTRADERMAL | Status: AC
  Administered 2015-05-29: 100 ug via INTRADERMAL
  Filled 2015-05-29: qty 0.01

## 2015-05-29 MED ORDER — FAMOTIDINE IN NACL 20-0.9 MG/50ML-% IV SOLN
INTRAVENOUS | Status: AC
  Filled 2015-05-29: qty 50

## 2015-05-29 MED ORDER — DIPHENHYDRAMINE HCL 50 MG/ML IJ SOLN
INTRAMUSCULAR | Status: AC
  Filled 2015-05-29: qty 1

## 2015-05-29 MED ORDER — OXYCODONE HCL 5 MG PO TABS: 5.0000 mg | ORAL_TABLET | ORAL | Status: DC | PRN

## 2015-05-29 MED ORDER — HEPARIN SOD (PORK) LOCK FLUSH 100 UNIT/ML IV SOLN
500.0000 [IU] | Freq: Once | INTRAVENOUS | Status: AC | PRN
  Administered 2015-05-29: 500 [IU]
  Filled 2015-05-29: qty 5

## 2015-05-29 MED ORDER — SODIUM CHLORIDE 0.9 % IV SOLN
Freq: Once | INTRAVENOUS | Status: AC
  Administered 2015-05-29: 09:00:00 via INTRAVENOUS

## 2015-05-29 MED ORDER — PACLITAXEL CHEMO INJECTION 300 MG/50ML
80.0000 mg/m2 | Freq: Once | INTRAVENOUS | Status: AC
  Administered 2015-05-29: 120 mg via INTRAVENOUS
  Filled 2015-05-29: qty 20

## 2015-05-29 MED ORDER — SODIUM CHLORIDE 0.9 % IV SOLN
Freq: Once | INTRAVENOUS | Status: AC
  Administered 2015-05-29: 10:00:00 via INTRAVENOUS
  Filled 2015-05-29: qty 8

## 2015-05-29 MED ORDER — SODIUM CHLORIDE 0.9 % IV SOLN
145.4000 mg | Freq: Once | INTRAVENOUS | Status: AC
  Administered 2015-05-29: 150 mg via INTRAVENOUS
  Filled 2015-05-29: qty 15

## 2015-05-29 MED ORDER — DIPHENHYDRAMINE HCL 50 MG/ML IJ SOLN
50.0000 mg | Freq: Once | INTRAMUSCULAR | Status: AC
  Administered 2015-05-29: 50 mg via INTRAVENOUS

## 2015-05-29 MED ORDER — SODIUM CHLORIDE 0.9 % IJ SOLN
10.0000 mL | INTRAMUSCULAR | Status: DC | PRN
  Administered 2015-05-29: 10 mL
  Filled 2015-05-29: qty 10

## 2015-05-29 MED ORDER — FAMOTIDINE IN NACL 20-0.9 MG/50ML-% IV SOLN
20.0000 mg | Freq: Once | INTRAVENOUS | Status: AC
  Administered 2015-05-29: 20 mg via INTRAVENOUS

## 2015-05-29 NOTE — Patient Instructions (Signed)
[facility] Discharge Instructions for Patients Receiving Chemotherapy  Today you received the following chemotherapy agents: Taxol and Carboplatin.  To help prevent nausea and vomiting after your treatment, we encourage you to take your nausea medication: compazine 110 mg every 6 hours as needed and Zofran 8 mg every 8  Hours as needed.   If you develop nausea and vomiting that is not controlled by your nausea medication, call the clinic.   BELOW ARE SYMPTOMS THAT SHOULD BE REPORTED IMMEDIATELY:  *FEVER GREATER THAN 100.5 F  *CHILLS WITH OR WITHOUT FEVER  NAUSEA AND VOMITING THAT IS NOT CONTROLLED WITH YOUR NAUSEA MEDICATION  *UNUSUAL SHORTNESS OF BREATH  *UNUSUAL BRUISING OR BLEEDING  TENDERNESS IN MOUTH AND THROAT WITH OR WITHOUT PRESENCE OF ULCERS  *URINARY PROBLEMS  *BOWEL PROBLEMS  UNUSUAL RASH Items with * indicate a potential emergency and should be followed up as soon as possible.  Feel free to call the clinic you have any questions or concerns. The clinic phone number is [phone].  Please show the [facility] at check-in to the Emergency Department and triage nurse.

## 2015-05-29 NOTE — Patient Instructions (Signed)

## 2015-05-29 NOTE — Progress Notes (Signed)
Nutrition follow-up completed with patient in chemotherapy for metastatic hypopharyngeal cancer. Weight decreased and documented as 116.4 pounds July 7. Patient reportedly drinking Ensure Plus once daily. States she eats 3 meals a day but doesn't snack much.  Nutrition diagnosis: Unintended weight loss related to metastatic cancer as evidenced by 5 pound weight loss over one month.  Intervention: Educated patient to increase Ensure Plus twice a day between meals.  Midafternoon and bedtime. Encouraged high-calorie, high-protein foods. Teach back method was used.  Monitoring, evaluation, goals: Patient will work to increase oral intake to minimize weight loss.  Next visit: Thursday, August 4, during infusion.  **Disclaimer: This note was dictated with voice recognition software. Similar sounding words can inadvertently be transcribed and this note may contain transcription errors which may not have been corrected upon publication of note.**

## 2015-06-05 ENCOUNTER — Ambulatory Visit: Payer: Self-pay

## 2015-06-05 ENCOUNTER — Encounter: Payer: Self-pay | Admitting: Nutrition

## 2015-06-12 ENCOUNTER — Encounter: Payer: Self-pay | Admitting: Hematology and Oncology

## 2015-06-12 ENCOUNTER — Other Ambulatory Visit (HOSPITAL_BASED_OUTPATIENT_CLINIC_OR_DEPARTMENT_OTHER): Payer: Medicare Other

## 2015-06-12 ENCOUNTER — Ambulatory Visit (HOSPITAL_BASED_OUTPATIENT_CLINIC_OR_DEPARTMENT_OTHER): Payer: Medicare Other

## 2015-06-12 ENCOUNTER — Ambulatory Visit: Payer: Medicare Other

## 2015-06-12 ENCOUNTER — Ambulatory Visit (HOSPITAL_BASED_OUTPATIENT_CLINIC_OR_DEPARTMENT_OTHER): Payer: Medicare Other | Admitting: Hematology and Oncology

## 2015-06-12 VITALS — BP 127/60 | HR 103 | Temp 98.2°F | Resp 18 | Ht 59.5 in | Wt 114.6 lb

## 2015-06-12 DIAGNOSIS — C139 Malignant neoplasm of hypopharynx, unspecified: Secondary | ICD-10-CM

## 2015-06-12 DIAGNOSIS — C7951 Secondary malignant neoplasm of bone: Secondary | ICD-10-CM | POA: Diagnosis not present

## 2015-06-12 DIAGNOSIS — E46 Unspecified protein-calorie malnutrition: Secondary | ICD-10-CM

## 2015-06-12 DIAGNOSIS — G622 Polyneuropathy due to other toxic agents: Secondary | ICD-10-CM | POA: Diagnosis not present

## 2015-06-12 DIAGNOSIS — J90 Pleural effusion, not elsewhere classified: Secondary | ICD-10-CM

## 2015-06-12 DIAGNOSIS — G620 Drug-induced polyneuropathy: Secondary | ICD-10-CM

## 2015-06-12 DIAGNOSIS — Z95828 Presence of other vascular implants and grafts: Secondary | ICD-10-CM

## 2015-06-12 DIAGNOSIS — K1231 Oral mucositis (ulcerative) due to antineoplastic therapy: Secondary | ICD-10-CM

## 2015-06-12 DIAGNOSIS — Z5111 Encounter for antineoplastic chemotherapy: Secondary | ICD-10-CM

## 2015-06-12 DIAGNOSIS — T451X5A Adverse effect of antineoplastic and immunosuppressive drugs, initial encounter: Secondary | ICD-10-CM

## 2015-06-12 DIAGNOSIS — D6481 Anemia due to antineoplastic chemotherapy: Secondary | ICD-10-CM

## 2015-06-12 LAB — CBC WITH DIFFERENTIAL/PLATELET
BASO%: 1.1 % (ref 0.0–2.0)
Basophils Absolute: 0.1 10*3/uL (ref 0.0–0.1)
EOS%: 2 % (ref 0.0–7.0)
Eosinophils Absolute: 0.1 10*3/uL (ref 0.0–0.5)
HEMATOCRIT: 29.2 % — AB (ref 34.8–46.6)
HGB: 9.5 g/dL — ABNORMAL LOW (ref 11.6–15.9)
LYMPH#: 0.7 10*3/uL — AB (ref 0.9–3.3)
LYMPH%: 15.3 % (ref 14.0–49.7)
MCH: 28.5 pg (ref 25.1–34.0)
MCHC: 32.4 g/dL (ref 31.5–36.0)
MCV: 87.9 fL (ref 79.5–101.0)
MONO#: 0.6 10*3/uL (ref 0.1–0.9)
MONO%: 12.5 % (ref 0.0–14.0)
NEUT#: 3.2 10*3/uL (ref 1.5–6.5)
NEUT%: 69.1 % (ref 38.4–76.8)
Platelets: 308 10*3/uL (ref 145–400)
RBC: 3.32 10*6/uL — AB (ref 3.70–5.45)
RDW: 18.1 % — ABNORMAL HIGH (ref 11.2–14.5)
WBC: 4.7 10*3/uL (ref 3.9–10.3)

## 2015-06-12 LAB — COMPREHENSIVE METABOLIC PANEL (CC13)
ALT: 6 U/L (ref 0–55)
ANION GAP: 10 meq/L (ref 3–11)
AST: 15 U/L (ref 5–34)
Albumin: 3.2 g/dL — ABNORMAL LOW (ref 3.5–5.0)
Alkaline Phosphatase: 94 U/L (ref 40–150)
BUN: 12.3 mg/dL (ref 7.0–26.0)
CO2: 27 meq/L (ref 22–29)
Calcium: 10.1 mg/dL (ref 8.4–10.4)
Chloride: 103 mEq/L (ref 98–109)
Creatinine: 0.8 mg/dL (ref 0.6–1.1)
EGFR: >90 mL/min/{1.73_m2} (ref >90)
Glucose: 85 mg/dl (ref 70–140)
Potassium: 4.1 mEq/L (ref 3.5–5.1)
Sodium: 140 mEq/L (ref 136–145)
Total Bilirubin: 0.25 mg/dL (ref 0.20–1.20)
Total Protein: 7.2 g/dL (ref 6.4–8.3)

## 2015-06-12 MED ORDER — DIPHENHYDRAMINE HCL 50 MG/ML IJ SOLN
INTRAMUSCULAR | Status: AC
  Filled 2015-06-12: qty 1

## 2015-06-12 MED ORDER — CARBOPLATIN CHEMO INTRADERMAL TEST DOSE 100MCG/0.02ML
100.0000 ug | Freq: Once | INTRADERMAL | Status: AC
  Administered 2015-06-12: 100 ug via INTRADERMAL
  Filled 2015-06-12: qty 0.01

## 2015-06-12 MED ORDER — HEPARIN SOD (PORK) LOCK FLUSH 100 UNIT/ML IV SOLN
500.0000 [IU] | Freq: Once | INTRAVENOUS | Status: AC | PRN
  Administered 2015-06-12: 500 [IU]
  Filled 2015-06-12: qty 5

## 2015-06-12 MED ORDER — PACLITAXEL CHEMO INJECTION 300 MG/50ML
80.0000 mg/m2 | Freq: Once | INTRAVENOUS | Status: AC
  Administered 2015-06-12: 120 mg via INTRAVENOUS
  Filled 2015-06-12: qty 20

## 2015-06-12 MED ORDER — SODIUM CHLORIDE 0.9 % IV SOLN
Freq: Once | INTRAVENOUS | Status: AC
  Administered 2015-06-12: 09:00:00 via INTRAVENOUS

## 2015-06-12 MED ORDER — FAMOTIDINE IN NACL 20-0.9 MG/50ML-% IV SOLN
INTRAVENOUS | Status: AC
  Filled 2015-06-12: qty 50

## 2015-06-12 MED ORDER — OXYCODONE HCL 5 MG PO TABS: 10.0000 mg | ORAL_TABLET | Freq: Four times a day (QID) | ORAL | Status: AC | PRN

## 2015-06-12 MED ORDER — DIPHENHYDRAMINE HCL 50 MG/ML IJ SOLN
50.0000 mg | Freq: Once | INTRAMUSCULAR | Status: AC
  Administered 2015-06-12: 50 mg via INTRAVENOUS

## 2015-06-12 MED ORDER — SODIUM CHLORIDE 0.9 % IV SOLN
145.4000 mg | Freq: Once | INTRAVENOUS | Status: AC
  Administered 2015-06-12: 150 mg via INTRAVENOUS
  Filled 2015-06-12: qty 15

## 2015-06-12 MED ORDER — SODIUM CHLORIDE 0.9 % IJ SOLN
10.0000 mL | INTRAMUSCULAR | Status: DC | PRN
  Administered 2015-06-12: 10 mL
  Filled 2015-06-12: qty 10

## 2015-06-12 MED ORDER — SODIUM CHLORIDE 0.9 % IJ SOLN
10.0000 mL | INTRAMUSCULAR | Status: DC | PRN
  Administered 2015-06-12: 10 mL via INTRAVENOUS
  Filled 2015-06-12: qty 10

## 2015-06-12 MED ORDER — SODIUM CHLORIDE 0.9 % IV SOLN
Freq: Once | INTRAVENOUS | Status: AC
  Administered 2015-06-12: 11:00:00 via INTRAVENOUS
  Filled 2015-06-12: qty 8

## 2015-06-12 MED ORDER — FAMOTIDINE IN NACL 20-0.9 MG/50ML-% IV SOLN
20.0000 mg | Freq: Once | INTRAVENOUS | Status: AC
  Administered 2015-06-12: 20 mg via INTRAVENOUS

## 2015-06-12 NOTE — Assessment & Plan Note (Signed)

## 2015-06-12 NOTE — Patient Instructions (Signed)

## 2015-06-12 NOTE — Patient Instructions (Signed)
[facility] Discharge Instructions for Patients Receiving Chemotherapy  Today you received the following chemotherapy agents:  Taxol and Carboplatin  To help prevent nausea and vomiting after your treatment, we encourage you to take your nausea medication as ordered per MD.   If you develop nausea and vomiting that is not controlled by your nausea medication, call the clinic.   BELOW ARE SYMPTOMS THAT SHOULD BE REPORTED IMMEDIATELY:  *FEVER GREATER THAN 100.5 F  *CHILLS WITH OR WITHOUT FEVER  NAUSEA AND VOMITING THAT IS NOT CONTROLLED WITH YOUR NAUSEA MEDICATION  *UNUSUAL SHORTNESS OF BREATH  *UNUSUAL BRUISING OR BLEEDING  TENDERNESS IN MOUTH AND THROAT WITH OR WITHOUT PRESENCE OF ULCERS  *URINARY PROBLEMS  *BOWEL PROBLEMS  UNUSUAL RASH Items with * indicate a potential emergency and should be followed up as soon as possible.  Feel free to call the clinic you have any questions or concerns. The clinic phone number is [phone].  Please show the CHEMO ALERT CARD at check-in to the Emergency Department and triage nurse.   

## 2015-06-12 NOTE — Progress Notes (Signed)
[place] OFFICE PROGRESS NOTE  Patient Care Team: Lester Kinsman, PA-C as PCP - General (Physician Assistant) Thea Silversmith, MD (Radiation Oncology) Jerrell Belfast, MD (Otolaryngology) Heath Lark, MD as Consulting Physician (Hematology and Oncology) Leota Sauers, RN as Oncology Nurse Navigator (Oncology) Karie Mainland, RD as Dietitian (Nutrition)  SUMMARY OF ONCOLOGIC HISTORY: Oncology History   Cancer of hypopharynx, base of tongue, squamous cell carcinoma, HPV focally positive   Primary site: Pharynx - Hypopharynx   Staging method: AJCC 7th Edition   Clinical: Stage III (T2, N1, M0) signed by Thea Silversmith, MD on 10/07/2011 11:50 AM   Summary: Stage III (T2, N1, M0)       Cancer of hypopharynx   07/21/2011 Pathology Results Neck fine needle aspirate was negative for malignancy.   09/03/2011 Pathology Results Right neck fine needle aspirate and biopsy confirmed malignancy, squamous cell carcinoma, HPV marginally positive.   09/30/2011 Imaging PET CT scan confirmed right hypopharynx mass with regional lymphadenopathy.   10/28/2011 - 12/17/2011 Radiation Therapy The patient completed radiation therapy.   10/28/2011 - 12/23/2011 Chemotherapy The patient received 3 doses of high dose cisplatin. Her last dose of chemotherapy have 25% dose adjustment due to side effects.   03/16/2012 Imaging Repeat PET scan confirmed near complete response to treatment.   05/23/2012 Imaging Repeat PET/CT scan confirmed recurrence of disease in the right neck lymph node.   06/14/2012 Pathology Results Pathology confirmed only one lymph node involvement.   06/14/2012 Surgery The patient underwent right neck dissection for residual and recurrence of disease.   04/17/2014 Imaging Chest x-ray show multiple pulmonary nodules. CT scan of the neck showed no evidence of recurrence of disease.   04/18/2014 Imaging CT chest confirmed multiple pulmonary nodules highly suspicious for metastatic disease.    05/21/2014 Imaging PET CT scan showed multiple pulmonary nodules which are hypermetabolic.   05/27/2014 Procedure [id number] CT-guided biopsy confirms malignant cells, compatible with squamous cell carcinoma.   06/13/2014 - 10/31/2014 Chemotherapy She completed 6 cycles of palliative treatment with carboplatin, 5-FU and Erbitux   07/25/2014 Adverse Reaction Cycle 3 chemo is held and delayed due to leukopenia   08/20/2014 Imaging CT scan showed partial response to treatment.   11/13/2014 Imaging PET CT scan showed stable disease   11/14/2014 - 01/23/2015 Chemotherapy She received maintenance Erbitux every 2 weeks   02/05/2015 Imaging PET CT scan showed disease progression with new lymphadenopathy and new bone lesions.   04/10/2015 - 04/11/2015 Hospital Admission She was admited to an outside hospital for thoracentesis for large pleural effusion. 1700 ml was removed from the right side with malignant cytology   04/23/2015 Imaging CXR showed moderate right lung effusion   05/02/2015 Imaging PET scan showed widespread lymphagitic spread, moderate effusion and adrenal metastasis    Metastasis to bone    INTERVAL HISTORY: Please see below for problem oriented charting. She returns to be seen prior to cycle 2 of treatment. She has mild progressive weight loss and shortness of breath on minimal exertion. Her pain in the right chest wall appear to be well-controlled with current prescription pain medicine. She denies constipation. She denies side effects from treatment. No worsening peripheral neuropathy. She denies cough.  REVIEW OF SYSTEMS:   Constitutional: Denies fevers, chills  Eyes: Denies blurriness of vision Ears, nose, mouth, throat, and face: Denies mucositis or sore throat Respiratory: Denies cough, dyspnea or wheezes Cardiovascular: Denies palpitation, chest discomfort or lower extremity swelling Gastrointestinal:  Denies nausea, heartburn or change in bowel  habits Skin: Denies abnormal skin  rashes Lymphatics: Denies new lymphadenopathy or easy bruising Neurological:Denies numbness, tingling or new weaknesses Behavioral/Psych: Mood is stable, no new changes  All other systems were reviewed with the patient and are negative.  I have reviewed the past medical history, past surgical history, social history and family history with the patient and they are unchanged from previous note.  ALLERGIES:  has No Known Allergies.  MEDICATIONS:  Current Outpatient Prescriptions  Medication Sig Dispense Refill  . Alum & Mag Hydroxide-Simeth (MAGIC MOUTHWASH) SOLN Take 5 mLs by mouth 4 (four) times daily as needed for mouth pain. 240 mL 0  . calcium carbonate (OS-CAL) 1250 MG chewable tablet Chew 1 tablet by mouth daily.     . Cholecalciferol (VITAMIN D) 2000 UNITS CAPS Take 2,000 Units by mouth daily.     Marland Kitchen docusate sodium (COLACE) 100 MG capsule Take 1 capsule (100 mg total) by mouth 2 (two) times daily as needed for mild constipation (keep stool soft). 60 capsule 3  . fexofenadine-pseudoephedrine (ALLEGRA-D 24) 180-240 MG per 24 hr tablet Take 1 tablet by mouth daily as needed (for allergies).     . hydrocortisone (ANUSOL-HC) 2.5 % rectal cream Place 1 application rectally 2 (two) times daily. Use on hemorrhoids as needed for one week.  Call MD if not better in one week. 30 g 0  . hydrocortisone 1 % ointment Apply 1 application topically 2 (two) times daily. 30 g 0  . lidocaine-prilocaine (EMLA) cream Apply 1 application topically as needed. 30 g 6  . Omega-3 Fatty Acids (OMEGA-3 FISH OIL PO) Take 1,280 mg by mouth daily.    Marland Kitchen omeprazole (PRILOSEC) 40 MG capsule Take 40 mg by mouth daily as needed (Acid reflux).     . ondansetron (ZOFRAN) 8 MG tablet Take 1 tablet (8 mg total) by mouth every 8 (eight) hours as needed for nausea or vomiting. 60 tablet 3  . OVER THE COUNTER MEDICATION Take 1 tablet by mouth daily. MED NAME: Vitamin B-17    . oxyCODONE (OXY IR/ROXICODONE) 5 MG immediate release  tablet Take 2 tablets (10 mg total) by mouth every 6 (six) hours as needed for severe pain. 90 tablet 0  . Polyethyl Glycol-Propyl Glycol (SYSTANE OP) Place 1 drop into both eyes 3 (three) times daily as needed (for allergies).     . pravastatin (PRAVACHOL) 40 MG tablet Take 40 mg by mouth daily.    . prochlorperazine (COMPAZINE) 10 MG tablet Take 1 tablet (10 mg total) by mouth every 6 (six) hours as needed for nausea or vomiting. 60 tablet 3  . Vitamin D, Ergocalciferol, (DRISDOL) 50000 UNITS CAPS capsule Take 50,000 Units by mouth once a week.  0   No current facility-administered medications for this visit.   Facility-Administered Medications Ordered in Other Visits  Medication Dose Route Frequency Provider Last Rate Last Dose  . CARBOplatin (PARAPLATIN) 150 mg in sodium chloride 0.9 % 100 mL chemo infusion  150 mg Intravenous Once Heath Lark, MD      . heparin lock flush 100 unit/mL  500 Units Intracatheter Once PRN Heath Lark, MD      . ondansetron (ZOFRAN) 16 mg, dexamethasone (DECADRON) 20 mg in sodium chloride 0.9 % 50 mL IVPB   Intravenous Once Heath Lark, MD      . PACLitaxel (TAXOL) 120 mg in dextrose 5 % 250 mL chemo infusion (</= '80mg'$ /m2)  80 mg/m2 (Treatment Plan Actual) Intravenous Once Heath Lark, MD      .  sodium chloride 0.9 % injection 10 mL  10 mL Intracatheter PRN Heath Lark, MD        PHYSICAL EXAMINATION: ECOG PERFORMANCE STATUS: 1 - Symptomatic but completely ambulatory  Filed Vitals:   06/12/15 0842  BP: 127/60  Pulse: 103  Temp: 98.2 F (36.8 C)  Resp: 18   Filed Weights   06/12/15 0842  Weight: 114 lb 9.6 oz (51.982 kg)    GENERAL:alert, no distress and comfortable. She looks thin SKIN: skin color, texture, turgor are normal, no rashes or significant lesions EYES: normal, Conjunctiva are pink and non-injected, sclera clear OROPHARYNX:no exudate, no erythema and lips, buccal mucosa, and tongue normal  NECK: Well-healed surgical scar around the  neck. LYMPH:  no palpable lymphadenopathy in the cervical, axillary or inguinal LUNGS: She had mild increased breathing effort with persistent dullness on percussion and reduced breast on the right lung base HEART: regular rate & rhythm and no murmurs and no lower extremity edema ABDOMEN:abdomen soft, non-tender and normal bowel sounds Musculoskeletal:no cyanosis of digits and no clubbing  NEURO: alert & oriented x 3 with fluent speech, no focal motor/sensory deficits  LABORATORY DATA:  I have reviewed the data as listed    Component Value Date/Time   NA 140 06/12/2015 0817   NA 140 06/07/2012 1241   K 4.1 06/12/2015 0817   K 3.9 06/07/2012 1241   CL 103 04/17/2013 0803   CL 103 06/07/2012 1241   CO2 27 06/12/2015 0817   CO2 28 06/07/2012 1241   GLUCOSE 85 06/12/2015 0817   GLUCOSE 78 04/17/2013 0803   GLUCOSE 91 06/07/2012 1241   BUN 12.3 06/12/2015 0817   BUN 16 06/07/2012 1241   CREATININE 0.8 06/12/2015 0817   CREATININE 1.05 06/07/2012 1241   CALCIUM 10.1 06/12/2015 0817   CALCIUM 9.3 06/07/2012 1241   PROT 7.2 06/12/2015 0817   PROT 6.3 03/17/2012 0824   ALBUMIN 3.2* 06/12/2015 0817   ALBUMIN 4.1 03/17/2012 0824   AST 15 06/12/2015 0817   AST 16 03/17/2012 0824   ALT 6 06/12/2015 0817   ALT <8 03/17/2012 0824   ALKPHOS 94 06/12/2015 0817   ALKPHOS 61 03/17/2012 0824   BILITOT 0.25 06/12/2015 0817   BILITOT 0.2* 03/17/2012 0824   GFRNONAA 55* 06/07/2012 1241   GFRAA 64* 06/07/2012 1241    No results found for: SPEP, UPEP  Lab Results  Component Value Date   WBC 4.7 06/12/2015   NEUTROABS 3.2 06/12/2015   HGB 9.5* 06/12/2015   HCT 29.2* 06/12/2015   MCV 87.9 06/12/2015   PLT 308 06/12/2015      Chemistry      Component Value Date/Time   NA 140 06/12/2015 0817   NA 140 06/07/2012 1241   K 4.1 06/12/2015 0817   K 3.9 06/07/2012 1241   CL 103 04/17/2013 0803   CL 103 06/07/2012 1241   CO2 27 06/12/2015 0817   CO2 28 06/07/2012 1241   BUN 12.3  06/12/2015 0817   BUN 16 06/07/2012 1241   CREATININE 0.8 06/12/2015 0817   CREATININE 1.05 06/07/2012 1241      Component Value Date/Time   CALCIUM 10.1 06/12/2015 0817   CALCIUM 9.3 06/07/2012 1241   ALKPHOS 94 06/12/2015 0817   ALKPHOS 61 03/17/2012 0824   AST 15 06/12/2015 0817   AST 16 03/17/2012 0824   ALT 6 06/12/2015 0817   ALT <8 03/17/2012 0824   BILITOT 0.25 06/12/2015 0817   BILITOT 0.2* 03/17/2012 4098  ASSESSMENT & PLAN:  Cancer of hypopharynx She tolerated treatment well without any side effects. I plan to give her 3 weeks on, one-week off treatment. I plan to see her back prior to cycle 3 day 1 treatment with repeat PET scan to assess response to treatment.   Anemia due to antineoplastic chemotherapy This is likely due to recent treatment. The patient denies recent history of bleeding such as epistaxis, hematuria or hematochezia. She is asymptomatic from the anemia. I will observe for now.  She does not require transfusion now. I will continue the chemotherapy at current dose without dosage adjustment.  If the anemia gets progressive worse in the future, I might have to delay her treatment or adjust the chemotherapy dose.   Metastasis to bone She has minor bone pain, controlled with taking oxycodone. This is stable. I refill her medications today.   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 I will consider dose adjustment in the future if she had worsening neuropathy.     Protein calorie malnutrition This is moderate to severe. Albumin level is low and she has lost 12 pounds in 3 months Her appetite has improved since recent thoracentesis. I recommend increase oral intake as tolerated I will also consult our dietitian to follow    Orders Placed This Encounter  Procedures  . NM PET Image Restag (PS) Skull Base To Thigh    Standing Status: Future     Number of  Occurrences:      Standing Expiration Date: 08/11/2016    Order Specific Question:  Reason for Exam (SYMPTOM  OR DIAGNOSIS REQUIRED)    Answer:  staging metastatic oropharyngeal ca s/p chemo,a ssess response to Rx    Order Specific Question:  Preferred imaging location?    Answer:  [facility]   All questions were answered. The patient knows to call the clinic with any problems, questions or concerns. No barriers to learning was detected. I spent 30 minutes counseling the patient face to face. The total time spent in the appointment was 40 minutes and more than 50% was on counseling and review of test results     [facility], [place], MD 06/12/2015 10:36 AM

## 2015-06-12 NOTE — Assessment & Plan Note (Signed)
She has minor bone pain, controlled with taking oxycodone. This is stable. I refill her medications today.

## 2015-06-12 NOTE — Assessment & Plan Note (Signed)
This is moderate to severe. Albumin level is low and she has lost 12 pounds in 3 months Her appetite has improved since recent thoracentesis. I recommend increase oral intake as tolerated I will also consult our dietitian to follow

## 2015-06-12 NOTE — Assessment & Plan Note (Signed)
This is minor, grade 1 from prior treatment. 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 I will consider dose adjustment in the future if she had worsening neuropathy.

## 2015-06-12 NOTE — Assessment & Plan Note (Signed)
She tolerated treatment well without any side effects. I plan to give her 3 weeks on, one-week off treatment. I plan to see her back prior to cycle 3 day 1 treatment with repeat PET scan to assess response to treatment.

## 2015-06-12 NOTE — Progress Notes (Signed)
Patient in for [facility] access and labs today. Patient also has an appointment in Infusion Room. PAC accessed and flushed without any difficulty. No blood return noted with flush. Patient denies any pain or discomfort. PAC covered with a Tegaderm dressing. Patient agreed to have all labs drawn by Phlebotomist today. Hilario Quarry, Licensed conveyancer, Infusion Room Charge Nurse made aware of no blood return from Patient's PAC. Patient discharged from Flush Room without any complaints.

## 2015-06-16 ENCOUNTER — Emergency Department (HOSPITAL_COMMUNITY): Payer: Medicare Other

## 2015-06-16 ENCOUNTER — Inpatient Hospital Stay (HOSPITAL_COMMUNITY)
Admission: EM | Admit: 2015-06-16 | Discharge: 2015-07-17 | DRG: 207 | Disposition: E | Payer: Medicare Other | Attending: Pulmonary Disease | Admitting: Pulmonary Disease

## 2015-06-16 ENCOUNTER — Encounter (HOSPITAL_COMMUNITY): Payer: Self-pay | Admitting: *Deleted

## 2015-06-16 DIAGNOSIS — Z6823 Body mass index (BMI) 23.0-23.9, adult: Secondary | ICD-10-CM | POA: Diagnosis not present

## 2015-06-16 DIAGNOSIS — M19041 Primary osteoarthritis, right hand: Secondary | ICD-10-CM | POA: Diagnosis present

## 2015-06-16 DIAGNOSIS — Z8052 Family history of malignant neoplasm of bladder: Secondary | ICD-10-CM | POA: Diagnosis not present

## 2015-06-16 DIAGNOSIS — G9340 Encephalopathy, unspecified: Secondary | ICD-10-CM | POA: Diagnosis present

## 2015-06-16 DIAGNOSIS — R001 Bradycardia, unspecified: Secondary | ICD-10-CM | POA: Diagnosis present

## 2015-06-16 DIAGNOSIS — Z923 Personal history of irradiation: Secondary | ICD-10-CM

## 2015-06-16 DIAGNOSIS — I519 Heart disease, unspecified: Secondary | ICD-10-CM | POA: Diagnosis not present

## 2015-06-16 DIAGNOSIS — Z978 Presence of other specified devices: Secondary | ICD-10-CM

## 2015-06-16 DIAGNOSIS — C7951 Secondary malignant neoplasm of bone: Secondary | ICD-10-CM | POA: Diagnosis present

## 2015-06-16 DIAGNOSIS — E785 Hyperlipidemia, unspecified: Secondary | ICD-10-CM | POA: Diagnosis present

## 2015-06-16 DIAGNOSIS — K219 Gastro-esophageal reflux disease without esophagitis: Secondary | ICD-10-CM | POA: Diagnosis present

## 2015-06-16 DIAGNOSIS — E871 Hypo-osmolality and hyponatremia: Secondary | ICD-10-CM | POA: Diagnosis present

## 2015-06-16 DIAGNOSIS — Z515 Encounter for palliative care: Secondary | ICD-10-CM | POA: Diagnosis not present

## 2015-06-16 DIAGNOSIS — J910 Malignant pleural effusion: Secondary | ICD-10-CM | POA: Diagnosis present

## 2015-06-16 DIAGNOSIS — T451X5A Adverse effect of antineoplastic and immunosuppressive drugs, initial encounter: Secondary | ICD-10-CM | POA: Diagnosis not present

## 2015-06-16 DIAGNOSIS — G620 Drug-induced polyneuropathy: Secondary | ICD-10-CM | POA: Diagnosis present

## 2015-06-16 DIAGNOSIS — J189 Pneumonia, unspecified organism: Secondary | ICD-10-CM | POA: Insufficient documentation

## 2015-06-16 DIAGNOSIS — D696 Thrombocytopenia, unspecified: Secondary | ICD-10-CM | POA: Diagnosis present

## 2015-06-16 DIAGNOSIS — M19042 Primary osteoarthritis, left hand: Secondary | ICD-10-CM | POA: Diagnosis present

## 2015-06-16 DIAGNOSIS — D6481 Anemia due to antineoplastic chemotherapy: Secondary | ICD-10-CM | POA: Diagnosis present

## 2015-06-16 DIAGNOSIS — J80 Acute respiratory distress syndrome: Secondary | ICD-10-CM

## 2015-06-16 DIAGNOSIS — D630 Anemia in neoplastic disease: Secondary | ICD-10-CM | POA: Diagnosis present

## 2015-06-16 DIAGNOSIS — Z4659 Encounter for fitting and adjustment of other gastrointestinal appliance and device: Secondary | ICD-10-CM

## 2015-06-16 DIAGNOSIS — R0902 Hypoxemia: Secondary | ICD-10-CM

## 2015-06-16 DIAGNOSIS — J9811 Atelectasis: Secondary | ICD-10-CM | POA: Diagnosis present

## 2015-06-16 DIAGNOSIS — E878 Other disorders of electrolyte and fluid balance, not elsewhere classified: Secondary | ICD-10-CM | POA: Diagnosis present

## 2015-06-16 DIAGNOSIS — Z87891 Personal history of nicotine dependence: Secondary | ICD-10-CM

## 2015-06-16 DIAGNOSIS — R739 Hyperglycemia, unspecified: Secondary | ICD-10-CM | POA: Diagnosis present

## 2015-06-16 DIAGNOSIS — J90 Pleural effusion, not elsewhere classified: Secondary | ICD-10-CM | POA: Diagnosis not present

## 2015-06-16 DIAGNOSIS — Z66 Do not resuscitate: Secondary | ICD-10-CM | POA: Diagnosis present

## 2015-06-16 DIAGNOSIS — C139 Malignant neoplasm of hypopharynx, unspecified: Secondary | ICD-10-CM | POA: Diagnosis present

## 2015-06-16 DIAGNOSIS — R0602 Shortness of breath: Secondary | ICD-10-CM

## 2015-06-16 DIAGNOSIS — Y95 Nosocomial condition: Secondary | ICD-10-CM | POA: Diagnosis present

## 2015-06-16 DIAGNOSIS — E440 Moderate protein-calorie malnutrition: Secondary | ICD-10-CM | POA: Diagnosis present

## 2015-06-16 DIAGNOSIS — J9690 Respiratory failure, unspecified, unspecified whether with hypoxia or hypercapnia: Secondary | ICD-10-CM

## 2015-06-16 DIAGNOSIS — J9602 Acute respiratory failure with hypercapnia: Secondary | ICD-10-CM | POA: Diagnosis present

## 2015-06-16 DIAGNOSIS — R0600 Dyspnea, unspecified: Secondary | ICD-10-CM | POA: Diagnosis not present

## 2015-06-16 DIAGNOSIS — I959 Hypotension, unspecified: Secondary | ICD-10-CM | POA: Diagnosis present

## 2015-06-16 DIAGNOSIS — J9600 Acute respiratory failure, unspecified whether with hypoxia or hypercapnia: Secondary | ICD-10-CM

## 2015-06-16 DIAGNOSIS — C7801 Secondary malignant neoplasm of right lung: Secondary | ICD-10-CM | POA: Diagnosis not present

## 2015-06-16 DIAGNOSIS — C7802 Secondary malignant neoplasm of left lung: Secondary | ICD-10-CM | POA: Diagnosis present

## 2015-06-16 DIAGNOSIS — Z9221 Personal history of antineoplastic chemotherapy: Secondary | ICD-10-CM | POA: Diagnosis not present

## 2015-06-16 DIAGNOSIS — Z8249 Family history of ischemic heart disease and other diseases of the circulatory system: Secondary | ICD-10-CM

## 2015-06-16 DIAGNOSIS — J9601 Acute respiratory failure with hypoxia: Secondary | ICD-10-CM | POA: Diagnosis present

## 2015-06-16 HISTORY — DX: Malignant neoplasm of unspecified part of unspecified bronchus or lung: C34.90

## 2015-06-16 LAB — COMPREHENSIVE METABOLIC PANEL
ALK PHOS: 76 U/L (ref 38–126)
ALT: 12 U/L — ABNORMAL LOW (ref 14–54)
ANION GAP: 9 (ref 5–15)
AST: 22 U/L (ref 15–41)
Albumin: 3.4 g/dL — ABNORMAL LOW (ref 3.5–5.0)
BILIRUBIN TOTAL: 0.6 mg/dL (ref 0.3–1.2)
BUN: 10 mg/dL (ref 6–20)
CO2: 24 mmol/L (ref 22–32)
Calcium: 9.1 mg/dL (ref 8.9–10.3)
Chloride: 100 mmol/L — ABNORMAL LOW (ref 101–111)
Creatinine, Ser: 0.6 mg/dL (ref 0.44–1.00)
GFR calc Af Amer: >60 mL/min (ref >60)
Glucose, Bld: 110 mg/dL — ABNORMAL HIGH (ref 65–99)
POTASSIUM: 4.1 mmol/L (ref 3.5–5.1)
Sodium: 133 mmol/L — ABNORMAL LOW (ref 135–145)
Total Protein: 7.4 g/dL (ref 6.5–8.1)

## 2015-06-16 LAB — URINALYSIS, ROUTINE W REFLEX MICROSCOPIC
Bilirubin Urine: NEGATIVE
Glucose, UA: NEGATIVE mg/dL
Ketones, ur: NEGATIVE mg/dL
Leukocytes, UA: NEGATIVE
Nitrite: NEGATIVE
PROTEIN: NEGATIVE mg/dL
Specific Gravity, Urine: 1.006 (ref 1.005–1.030)
Urobilinogen, UA: 0.2 mg/dL (ref 0.0–1.0)
pH: 6 (ref 5.0–8.0)

## 2015-06-16 LAB — CBC WITH DIFFERENTIAL/PLATELET
BASOS ABS: 0 10*3/uL (ref 0.0–0.1)
Basophils Relative: 0 % (ref 0–1)
EOS ABS: 0.1 10*3/uL (ref 0.0–0.7)
Eosinophils Relative: 1 % (ref 0–5)
HEMATOCRIT: 27.2 % — AB (ref 36.0–46.0)
Hemoglobin: 8.7 g/dL — ABNORMAL LOW (ref 12.0–15.0)
LYMPHS ABS: 0.9 10*3/uL (ref 0.7–4.0)
Lymphocytes Relative: 12 % (ref 12–46)
MCH: 27.8 pg (ref 26.0–34.0)
MCHC: 32 g/dL (ref 30.0–36.0)
MCV: 86.9 fL (ref 78.0–100.0)
MONO ABS: 0.3 10*3/uL (ref 0.1–1.0)
MONOS PCT: 4 % (ref 3–12)
NEUTROS PCT: 83 % — AB (ref 43–77)
Neutro Abs: 6 10*3/uL (ref 1.7–7.7)
Platelets: 254 10*3/uL (ref 150–400)
RBC: 3.13 MIL/uL — ABNORMAL LOW (ref 3.87–5.11)
RDW: 18.3 % — ABNORMAL HIGH (ref 11.5–15.5)
WBC: 7.2 10*3/uL (ref 4.0–10.5)

## 2015-06-16 LAB — URINE MICROSCOPIC-ADD ON

## 2015-06-16 LAB — I-STAT CG4 LACTIC ACID, ED
LACTIC ACID, VENOUS: 0.46 mmol/L — AB (ref 0.5–2.0)
LACTIC ACID, VENOUS: 0.47 mmol/L — AB (ref 0.5–2.0)

## 2015-06-16 MED ORDER — VANCOMYCIN HCL IN DEXTROSE 1-5 GM/200ML-% IV SOLN
1000.0000 mg | Freq: Once | INTRAVENOUS | Status: AC
  Administered 2015-06-16: 1000 mg via INTRAVENOUS
  Filled 2015-06-16: qty 200

## 2015-06-16 MED ORDER — PIPERACILLIN-TAZOBACTAM 3.375 G IVPB
3.3750 g | Freq: Once | INTRAVENOUS | Status: AC
Start: 2015-06-16 — End: 2015-06-17
  Administered 2015-06-16: 3.375 g via INTRAVENOUS
  Filled 2015-06-16: qty 50

## 2015-06-16 MED ORDER — IOHEXOL 350 MG/ML SOLN
100.0000 mL | Freq: Once | INTRAVENOUS | Status: AC | PRN
Start: 2015-06-16 — End: 2015-06-16
  Administered 2015-06-16: 100 mL via INTRAVENOUS

## 2015-06-16 MED ORDER — SODIUM CHLORIDE 0.9 % IV SOLN
INTRAVENOUS | Status: DC
  Administered 2015-06-16: via INTRAVENOUS

## 2015-06-16 MED ORDER — SODIUM CHLORIDE 0.9 % IV BOLUS (SEPSIS)
1000.0000 mL | Freq: Once | INTRAVENOUS | Status: AC
  Administered 2015-06-16: 1000 mL via INTRAVENOUS

## 2015-06-16 NOTE — ED Notes (Signed)
Pt states that she has been feeling short of breath since yesterday; pt states that the shortness of breath got worse today; pt [place] c/o gen weakness; pt O2 sats initially on arrival to triage were 86%; pt placed on 2lpm via [place] and sats up to 96%; pt only able to speak in small phrases due to shortness of breath; pt c/o prod cough with light yellow phlegm

## 2015-06-16 NOTE — ED Provider Notes (Signed)
CSN: [id number]     Arrival date & time 07/11/2015  2010 History   First MD Initiated Contact with Patient 07/15/2015 2059     Chief Complaint  Patient presents with  . Shortness of Breath     (Consider location/radiation/quality/duration/timing/severity/associated sxs/prior Treatment) Patient is a 69 y.o. female presenting with general illness. The history is provided by the patient.  Illness Severity:  Severe Onset quality:  Gradual Duration:  3 days Timing:  Constant Progression:  Worsening Chronicity:  New Associated symptoms: congestion, cough and shortness of breath   Associated symptoms: no chest pain, no fever, no headaches, no myalgias, no nausea, no rhinorrhea, no vomiting and no wheezing    69 yo F with a chief complaint of cough shortness of breath and subjective chills. Is going on for the past 4 days. Slowly worsening. Patient came in found to be 86% on room air placed on 2 L nasal cannula. Patient denies chest pain denies abdominal pain. 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 Patient has had difficulties with right-sided pleural effusions. Denies history of PE denies recent surgery. Currently undergoing chemotherapy last treated last Thursday.  Past Medical History  Diagnosis Date  . Hyperlipemia   . Allergy   . Recurrent sinus infections   . GERD (gastroesophageal reflux disease)   . Osteopenia   . Sinus infection     History of  . S/P radiation therapy 10/28/2011 - 12/17/2011    70 Gy to right Hypopharynx, involved lymph nodes, uninvolved lymph nodes  . Status post chemotherapy Started 10/28/2011    Q3Wk Cisplatin concurrent with Radiation Therapy  . H/O exercise stress test     done  >15 yrs. ago due to abnormal EKG.  Stress test was wnl, no need for f/u   . Arthritis     hands & feet   . Mucositis (ulcerative) due to antineoplastic therapy 06/20/2014  . Acne 07/04/2014  . Rash of face 07/04/2014  . 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 Tongue cancer    . 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 Lung cancer    Past Surgical History  Procedure Laterality Date  . Tubal ligation    . Fine needle aspiration  09/03/11    FNA to right Neck Mass - Squamous Cell Carcinoma of the Right Hypopharynx  . Refractive surgery      for retinal tears- seen by Dr. Baird Cancer   . Diagnostic laryngoscopy      done by Dr. Wilburn Cornelia- 07/2011- to  be done for diagnosis of CA of hypopharynyx   . Radical neck dissection  06/13/2012    Procedure: RADICAL NECK DISSECTION;  Surgeon: Jerrell Belfast, MD;  Location: [facility] OR;  Service: ENT;  Laterality: Right;  Right Neck Dissection    Family History  Problem Relation Age of Onset  . Coronary artery disease Mother   . Heart disease Mother   . Hypertension Mother   . Heart attack Mother   . Pulmonary embolism Sister   . Cancer Maternal Aunt     bladder ca   History  Substance Use Topics  . Smoking status: Former Smoker -- 1.00 packs/day for 30 years    Types: Cigarettes    Quit date: 11/15/2009  . Smokeless tobacco: Never Used  . Alcohol Use: 1.2 oz/week    2 Glasses of wine per week     Comment: none since late 2012   OB History  No data available     Review of Systems  Constitutional: Negative for fever and chills.  HENT: Positive for congestion. Negative for rhinorrhea.   Eyes: Negative for redness and visual disturbance.  Respiratory: Positive for cough and shortness of breath. Negative for wheezing.   Cardiovascular: Negative for chest pain and palpitations.  Gastrointestinal: Negative for nausea and vomiting.  Genitourinary: Negative for dysuria and urgency.  Musculoskeletal: Negative for myalgias and arthralgias.  Skin: Negative for pallor and wound.  Neurological: Negative for dizziness and headaches.      Allergies  Review of patient's allergies indicates no known allergies.  Home Medications   Prior to Admission medications   Medication Sig Start  Date End Date Taking? Authorizing Provider  Cholecalciferol (VITAMIN D) 2000 UNITS CAPS Take 2,000 Units by mouth daily.    Yes Historical Provider, MD  docusate sodium (COLACE) 100 MG capsule Take 1 capsule (100 mg total) by mouth 2 (two) times daily as needed for mild constipation (keep stool soft). 09/20/14  Yes Heath Lark, MD  fexofenadine-pseudoephedrine (ALLEGRA-D 24) 180-240 MG per 24 hr tablet Take 1 tablet by mouth daily as needed (for allergies).    Yes Historical Provider, MD  Omega-3 Fatty Acids (OMEGA-3 FISH OIL PO) Take 1,280 mg by mouth daily.   Yes Historical Provider, MD  ondansetron (ZOFRAN) 8 MG tablet Take 1 tablet (8 mg total) by mouth every 8 (eight) hours as needed for nausea or vomiting. 05/14/15  Yes Heath Lark, MD  OVER THE COUNTER MEDICATION Take 1 tablet by mouth daily. MED NAME: Vitamin B-17   Yes Historical Provider, MD  oxyCODONE (OXY IR/ROXICODONE) 5 MG immediate release tablet Take 2 tablets (10 mg total) by mouth every 6 (six) hours as needed for severe pain. 06/12/15  Yes Heath Lark, MD  pravastatin (PRAVACHOL) 40 MG tablet Take 40 mg by mouth daily.   Yes Historical Provider, MD  Tetrahydrozoline HCl (EYE DROPS OP) Place 1 drop into both eyes daily as needed (dry eyes).   Yes Historical Provider, MD  Alum & Mag Hydroxide-Simeth (MAGIC MOUTHWASH) SOLN Take 5 mLs by mouth 4 (four) times daily as needed for mouth pain. 11/01/14   Heath Lark, MD  hydrocortisone (ANUSOL-HC) 2.5 % rectal cream Place 1 application rectally 2 (two) times daily. Use on hemorrhoids as needed for one week.  Call MD if not better in one week. Patient not taking: Reported on 06/22/2015 09/20/14   Heath Lark, MD  hydrocortisone 1 % ointment Apply 1 application topically 2 (two) times daily. Patient not taking: Reported on 07/05/2015 07/04/14   Heath Lark, MD  lidocaine-prilocaine (EMLA) cream Apply 1 application topically as needed. 06/11/14   Heath Lark, MD  prochlorperazine (COMPAZINE) 10 MG tablet Take  1 tablet (10 mg total) by mouth every 6 (six) hours as needed for nausea or vomiting. Patient not taking: Reported on 06/27/2015 05/14/15   Heath Lark, MD   BP 139/80 mmHg  Pulse 117  Temp(Src) 98.5 F (36.9 C) (Oral)  Resp 39  SpO2 96% Physical Exam  Constitutional: She is oriented to person, place, and time. She appears well-developed and well-nourished. No distress.  HENT:  Head: Normocephalic and atraumatic.  Eyes: EOM are normal. Pupils are equal, round, and reactive to light.  Neck: Normal range of motion. Neck supple.  Cardiovascular: Normal rate and regular rhythm.  Exam reveals no gallop and no friction rub.   No murmur heard. Pulmonary/Chest: She is in respiratory distress. She has no wheezes. She  has no rales.  Tachypnea  Decreased breath sounds in the right lung base.  Abdominal: Soft. She exhibits no distension. There is no tenderness.  Musculoskeletal: She exhibits no edema or tenderness.  Neurological: She is alert and oriented to person, place, and time.  Skin: Skin is warm and dry. She is not diaphoretic.  Psychiatric: She has a normal mood and affect. Her behavior is normal.    ED Course  Procedures (including critical care time) Labs Review Labs Reviewed  COMPREHENSIVE METABOLIC PANEL - Abnormal; Notable for the following:    Sodium 133 (*)    Chloride 100 (*)    Glucose, Bld 110 (*)    Albumin 3.4 (*)    ALT 12 (*)    All other components within normal limits  URINALYSIS, ROUTINE W REFLEX MICROSCOPIC (NOT AT [facility]) - Abnormal; Notable for the following:    Hgb urine dipstick SMALL (*)    All other components within normal limits  CBC WITH DIFFERENTIAL/PLATELET - Abnormal; Notable for the following:    RBC 3.13 (*)    Hemoglobin 8.7 (*)    HCT 27.2 (*)    RDW 18.3 (*)    Neutrophils Relative % 83 (*)    All other components within normal limits  I-STAT CG4 LACTIC ACID, ED - Abnormal; Notable for the following:    Lactic Acid, Venous 0.46 (*)    All  other components within normal limits  I-STAT CG4 LACTIC ACID, ED - Abnormal; Notable for the following:    Lactic Acid, Venous 0.47 (*)    All other components within normal limits  CULTURE, BLOOD (ROUTINE X 2)  CULTURE, BLOOD (ROUTINE X 2)  URINE CULTURE  URINE MICROSCOPIC-ADD ON    Imaging Review Dg Chest 2 View  07/05/2015   CLINICAL DATA:  Initial encounter for shortness of breath with productive cough and right rib pain  EXAM: CHEST  2 VIEW  COMPARISON:  07/09/2015.  FINDINGS: Moderate right pleural effusion again noted with right base collapse/ consolidation persisting. There is pulmonary vascular congestion without overt pulmonary edema. Now with some parahilar airspace disease on the left. Cardiopericardial silhouette is at upper limits of normal for size. Left Port-A-Cath again noted with tip overlying the mid SVC. Telemetry leads overlie the chest. Imaged bony structures of the thorax are intact.  IMPRESSION: Stable small to moderate right pleural effusion with right base collapse/ consolidation.  Interval development of vascular congestion left parahilar airspace disease on the current study. Underlying component of pulmonary edema could have this appearance.   Electronically Signed   By: Misty Stanley M.D.   On: 06/21/2015 20:57   Ct Angio Chest Pe W/cm &/or Wo Cm  07/09/2015   CLINICAL DATA:  Subsequent encounter for feeling shortness of breath since yesterday with weakness. 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  EXAM: CT ANGIOGRAPHY CHEST WITH CONTRAST  TECHNIQUE: Multidetector CT imaging of the chest was performed using the standard protocol during bolus administration of intravenous contrast. Multiplanar CT image reconstructions and MIPs were obtained to evaluate the vascular anatomy.  CONTRAST:  180m OMNIPAQUE IOHEXOL 350 MG/ML SOLN  COMPARISON:  PET-CT from 05/02/2015.  FINDINGS: Mediastinum / Lymph Nodes: There is no filling defect within the opacified  pulmonary arteries to suggest the presence of an acute pulmonary embolus. No evidence for dissection of the thoracic aorta.  No axillary lymphadenopathy. 17 mm short axis precarinal lymph node is not substantially changed in the interval. 16 mm  short axis subcarinal lymph node is again noted. There is a 16 mm short axis lymph node in the right hilum. Heart is mildly enlarged. No pericardial effusion.  Lungs / Pleura: Lung windows show areas of interstitial lung ground-glass attenuation in the lungs bilaterally, left much greater than right. Volume loss in the right hemi thorax is stable. The fluid seen previously in the right major fissure has decreased. Left infrahilar nodule measures 2.1 cm today compared to 2.2 cm previously. There is right lower lobe collapse/consolidation. Right pleural effusion is not substantially changed in the interval.  MSK / Soft Tissues: Bone windows reveal no worrisome lytic or sclerotic osseous lesions.  Upper Abdomen: Heterogeneous perfusion noted in the liver, nonspecific. Small cyst in the posterior left kidney measures 11 mm.  Review of the MIP images confirms the above findings.  IMPRESSION: 1. No CT evidence for acute pulmonary embolus. 2. Right lower lobe collapse/consolidation with right pleural thickening and right pleural effusion. Overall imaging features are not substantially changed since the prior PET-CT. 3. Mediastinal and right hilar lymphadenopathy with left lower lobe pulmonary nodule, consistent with metastatic disease. 4. Interval development of interstitial and alveolar ground-glass attenuation, mainly in the left lung. Imaging features are nonspecific and differential considerations include pulmonary hemorrhage, bacterial or atypical infection, radiation pneumonitis, or lymphangitic tumor spread.   Electronically Signed   By: Misty Stanley M.D.   On: 06/23/2015 23:01     EKG Interpretation   Date/Time:  Monday June 16 2015 20:31:42 EDT Ventricular Rate:   122 PR Interval:  150 QRS Duration: 70 QT Interval:  346 QTC Calculation: 493 R Axis:   -27 Text Interpretation:  Normal sinus rhythm Baseline wander No significant  change since last tracing Confirmed by Ming Mcmannis MD, Quillian Quince [phone]) on  07/07/2015 11:30:01 PM      MDM   Final diagnoses:  Shortness of breath    69 yo F with a chief complaint of shortness breath. Hypoxic in triage special 2 L nasal cannula. Tachycardic into the 120s. Concern for possible pneumonia chest x-ray without obvious pneumonia CT PE scan ordered. Hemoglobin 8.7 just below baseline. Mild hyponatremia and hypochloremia. UA negative for infection lactic acid normal.  PE study read as possible infection as well as multiple other etiologies. Will treat with vanc and Zosyn. Admit to stepdown.  The patients results and plan were reviewed and discussed.   Any x-rays performed were independently reviewed by myself.   Differential diagnosis were considered with the presenting HPI.  Medications  vancomycin (VANCOCIN) IVPB 1000 mg/200 mL premix (1,000 mg Intravenous New Bag/Given 07/03/2015 2359)  piperacillin-tazobactam (ZOSYN) IVPB 3.375 g (3.375 g Intravenous New Bag/Given 06/28/2015 2358)  0.9 %  sodium chloride infusion ( Intravenous New Bag/Given 06/18/2015 2358)  sodium chloride 0.9 % bolus 1,000 mL (1,000 mLs Intravenous New Bag/Given 06/28/2015 2150)  iohexol (OMNIPAQUE) 350 MG/ML injection 100 mL (100 mLs Intravenous Contrast Given 07/10/2015 2235)    Filed Vitals:   06/27/2015 2021 07/13/2015 2034 06/21/2015 2344  BP: 132/62  139/80  Pulse: 122  117  Temp: 98.5 F (36.9 C)    TempSrc: Oral    Resp: 20  39  SpO2: 86% 97% 96%    Final diagnoses:  Shortness of breath    Admission/ observation were discussed with the admitting physician, patient and/or family and they are comfortable with the plan.    Deno Etienne, DO 06/17/15 0002

## 2015-06-17 ENCOUNTER — Encounter (HOSPITAL_COMMUNITY): Payer: Self-pay | Admitting: Internal Medicine

## 2015-06-17 ENCOUNTER — Inpatient Hospital Stay (HOSPITAL_COMMUNITY): Payer: Medicare Other

## 2015-06-17 DIAGNOSIS — E43 Unspecified severe protein-calorie malnutrition: Secondary | ICD-10-CM

## 2015-06-17 DIAGNOSIS — C7951 Secondary malignant neoplasm of bone: Secondary | ICD-10-CM

## 2015-06-17 DIAGNOSIS — D6481 Anemia due to antineoplastic chemotherapy: Secondary | ICD-10-CM

## 2015-06-17 DIAGNOSIS — G622 Polyneuropathy due to other toxic agents: Secondary | ICD-10-CM

## 2015-06-17 DIAGNOSIS — R0600 Dyspnea, unspecified: Secondary | ICD-10-CM

## 2015-06-17 DIAGNOSIS — C139 Malignant neoplasm of hypopharynx, unspecified: Secondary | ICD-10-CM

## 2015-06-17 DIAGNOSIS — J9601 Acute respiratory failure with hypoxia: Secondary | ICD-10-CM | POA: Diagnosis present

## 2015-06-17 LAB — CBC WITH DIFFERENTIAL/PLATELET
BASOS PCT: 0 % (ref 0–1)
Basophils Absolute: 0 10*3/uL (ref 0.0–0.1)
EOS PCT: 1 % (ref 0–5)
Eosinophils Absolute: 0 10*3/uL (ref 0.0–0.7)
HCT: 27.2 % — ABNORMAL LOW (ref 36.0–46.0)
Hemoglobin: 8.8 g/dL — ABNORMAL LOW (ref 12.0–15.0)
LYMPHS ABS: 0.8 10*3/uL (ref 0.7–4.0)
Lymphocytes Relative: 12 % (ref 12–46)
MCH: 28 pg (ref 26.0–34.0)
MCHC: 32.4 g/dL (ref 30.0–36.0)
MCV: 86.6 fL (ref 78.0–100.0)
MONO ABS: 0.3 10*3/uL (ref 0.1–1.0)
Monocytes Relative: 4 % (ref 3–12)
Neutro Abs: 5.7 10*3/uL (ref 1.7–7.7)
Neutrophils Relative %: 83 % — ABNORMAL HIGH (ref 43–77)
PLATELETS: 216 10*3/uL (ref 150–400)
RBC: 3.14 MIL/uL — AB (ref 3.87–5.11)
RDW: 18.2 % — AB (ref 11.5–15.5)
WBC: 6.8 10*3/uL (ref 4.0–10.5)

## 2015-06-17 LAB — COMPREHENSIVE METABOLIC PANEL
ALT: 11 U/L — AB (ref 14–54)
AST: 19 U/L (ref 15–41)
Albumin: 3 g/dL — ABNORMAL LOW (ref 3.5–5.0)
Alkaline Phosphatase: 75 U/L (ref 38–126)
Anion gap: 8 (ref 5–15)
BILIRUBIN TOTAL: 0.6 mg/dL (ref 0.3–1.2)
BUN: 8 mg/dL (ref 6–20)
CALCIUM: 8.6 mg/dL — AB (ref 8.9–10.3)
CO2: 24 mmol/L (ref 22–32)
Chloride: 105 mmol/L (ref 101–111)
Creatinine, Ser: 0.61 mg/dL (ref 0.44–1.00)
GFR calc non Af Amer: >60 mL/min (ref >60)
GLUCOSE: 111 mg/dL — AB (ref 65–99)
Potassium: 3.9 mmol/L (ref 3.5–5.1)
SODIUM: 137 mmol/L (ref 135–145)
Total Protein: 6.6 g/dL (ref 6.5–8.1)

## 2015-06-17 LAB — STREP PNEUMONIAE URINARY ANTIGEN: Strep Pneumo Urinary Antigen: NEGATIVE

## 2015-06-17 LAB — BRAIN NATRIURETIC PEPTIDE: B Natriuretic Peptide: 52.7 pg/mL (ref 0.0–100.0)

## 2015-06-17 LAB — MRSA PCR SCREENING: MRSA by PCR: NEGATIVE

## 2015-06-17 LAB — TSH: TSH: 1.743 u[IU]/mL (ref 0.350–4.500)

## 2015-06-17 LAB — TROPONIN I: Troponin I: <0.03 ng/mL (ref <0.031)

## 2015-06-17 LAB — PROCALCITONIN: PROCALCITONIN: 0.19 ng/mL

## 2015-06-17 MED ORDER — PRAVASTATIN SODIUM 20 MG PO TABS
40.0000 mg | ORAL_TABLET | Freq: Every day | ORAL | Status: DC
  Administered 2015-06-17: 40 mg via ORAL
  Filled 2015-06-17: qty 2

## 2015-06-17 MED ORDER — ONDANSETRON HCL 4 MG PO TABS: 4.0000 mg | ORAL_TABLET | Freq: Four times a day (QID) | ORAL | Status: DC | PRN

## 2015-06-17 MED ORDER — VITAMIN D 1000 UNITS PO TABS
2000.0000 [IU] | ORAL_TABLET | Freq: Every day | ORAL | Status: DC
  Administered 2015-06-17: 2000 [IU] via ORAL
  Filled 2015-06-17 (×2): qty 2

## 2015-06-17 MED ORDER — DOCUSATE SODIUM 100 MG PO CAPS
100.0000 mg | ORAL_CAPSULE | Freq: Two times a day (BID) | ORAL | Status: DC | PRN
  Filled 2015-06-17: qty 1

## 2015-06-17 MED ORDER — OXYCODONE HCL 5 MG PO TABS
10.0000 mg | ORAL_TABLET | Freq: Four times a day (QID) | ORAL | Status: DC | PRN
  Administered 2015-06-17 (×4): 10 mg via ORAL
  Filled 2015-06-17 (×5): qty 2

## 2015-06-17 MED ORDER — SODIUM CHLORIDE 0.9 % IV SOLN
INTRAVENOUS | Status: AC
  Administered 2015-06-17: 100 mL/h via INTRAVENOUS

## 2015-06-17 MED ORDER — PIPERACILLIN-TAZOBACTAM 3.375 G IVPB
3.3750 g | Freq: Three times a day (TID) | INTRAVENOUS | Status: DC
  Administered 2015-06-17 – 2015-06-25 (×25): 3.375 g via INTRAVENOUS
  Filled 2015-06-17 (×20): qty 50

## 2015-06-17 MED ORDER — SODIUM CHLORIDE 0.9 % IV SOLN
500.0000 mg | Freq: Two times a day (BID) | INTRAVENOUS | Status: DC
  Administered 2015-06-17 – 2015-06-19 (×4): 500 mg via INTRAVENOUS
  Filled 2015-06-17 (×6): qty 500

## 2015-06-17 MED ORDER — METHYLPREDNISOLONE SODIUM SUCC 125 MG IJ SOLR
60.0000 mg | Freq: Three times a day (TID) | INTRAMUSCULAR | Status: DC
  Administered 2015-06-17 – 2015-06-25 (×24): 60 mg via INTRAVENOUS
  Filled 2015-06-17 (×25): qty 2

## 2015-06-17 MED ORDER — ONDANSETRON HCL 4 MG PO TABS: 8.0000 mg | ORAL_TABLET | Freq: Three times a day (TID) | ORAL | Status: DC | PRN

## 2015-06-17 MED ORDER — ENOXAPARIN SODIUM 40 MG/0.4ML ~~LOC~~ SOLN
40.0000 mg | SUBCUTANEOUS | Status: DC
  Administered 2015-06-17 – 2015-06-24 (×8): 40 mg via SUBCUTANEOUS
  Filled 2015-06-17 (×8): qty 0.4

## 2015-06-17 MED ORDER — CETYLPYRIDINIUM CHLORIDE 0.05 % MT LIQD
7.0000 mL | Freq: Two times a day (BID) | OROMUCOSAL | Status: DC
  Administered 2015-06-17 (×3): 7 mL via OROMUCOSAL

## 2015-06-17 MED ORDER — ACETAMINOPHEN 650 MG RE SUPP: 650.0000 mg | Freq: Four times a day (QID) | RECTAL | Status: DC | PRN

## 2015-06-17 MED ORDER — ACETAMINOPHEN 325 MG PO TABS: 650.0000 mg | ORAL_TABLET | Freq: Four times a day (QID) | ORAL | Status: DC | PRN

## 2015-06-17 MED ORDER — PANTOPRAZOLE SODIUM 40 MG PO TBEC
40.0000 mg | DELAYED_RELEASE_TABLET | Freq: Every day | ORAL | Status: DC
  Administered 2015-06-17: 40 mg via ORAL
  Filled 2015-06-17: qty 1

## 2015-06-17 MED ORDER — ENSURE ENLIVE PO LIQD
237.0000 mL | Freq: Two times a day (BID) | ORAL | Status: DC
Start: 2015-06-17 — End: 2015-06-18
  Administered 2015-06-17: 237 mL via ORAL

## 2015-06-17 MED ORDER — ONDANSETRON HCL 4 MG/2ML IJ SOLN: 4.0000 mg | Freq: Four times a day (QID) | INTRAMUSCULAR | Status: DC | PRN

## 2015-06-17 NOTE — Progress Notes (Signed)
ANTIBIOTIC CONSULT NOTE - INITIAL  Pharmacy Consult for Vancomycin/Zosyn Indication: HCAP  No Known Allergies  Patient Measurements: Height: 4' 11.5" (151.1 cm) Weight: 115 lb 11.9 oz (52.5 kg) IBW/kg (Calculated) : 44.35   Vital Signs: Temp: 98.2 F (36.8 C) (08/02 0036) Temp Source: Oral (08/01 2021) BP: 131/81 mmHg (08/02 0036) Pulse Rate: 118 (08/02 0036) Intake/Output from previous day: 08/01 0701 - 08/02 0700 In: 253.3 [I.V.:3.3; IV Piggyback:250] Out: 400 [Urine:400] Intake/Output from this shift: Total I/O In: 253.3 [I.V.:3.3; IV Piggyback:250] Out: 400 [Urine:400]  Labs:  Recent Labs  07/11/2015 2056  WBC 7.2  HGB 8.7*  PLT 254  CREATININE 0.60   Estimated Creatinine Clearance: 48.5 mL/min (by C-G formula based on Cr of 0.6). No results for input(s): VANCOTROUGH, VANCOPEAK, VANCORANDOM, GENTTROUGH, GENTPEAK, GENTRANDOM, TOBRATROUGH, TOBRAPEAK, TOBRARND, AMIKACINPEAK, AMIKACINTROU, AMIKACIN in the last 72 hours.   Microbiology: No results found for this or any previous visit (from the past 720 hour(s)).  Medical History: Past Medical History  Diagnosis Date  . Hyperlipemia   . Allergy   . Recurrent sinus infections   . GERD (gastroesophageal reflux disease)   . Osteopenia   . Sinus infection     History of  . S/P radiation therapy 10/28/2011 - 12/17/2011    70 Gy to right Hypopharynx, involved lymph nodes, uninvolved lymph nodes  . Status post chemotherapy Started 10/28/2011    Q3Wk Cisplatin concurrent with Radiation Therapy  . H/O exercise stress test     done  >15 yrs. ago due to abnormal EKG.  Stress test was wnl, no need for f/u   . Arthritis     hands & feet   . Mucositis (ulcerative) due to antineoplastic therapy 06/20/2014  . Acne 07/04/2014  . Rash of face 07/04/2014  . 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 Tongue cancer   . 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 Lung cancer     Medications:   Prescriptions prior to admission  Medication Sig Dispense Refill Last Dose  . Cholecalciferol (VITAMIN D) 2000 UNITS CAPS Take 2,000 Units by mouth daily.    Past Month at Unknown time  . docusate sodium (COLACE) 100 MG capsule Take 1 capsule (100 mg total) by mouth 2 (two) times daily as needed for mild constipation (keep stool soft). 60 capsule 3 06/17/2015 at Unknown time  . fexofenadine-pseudoephedrine (ALLEGRA-D 24) 180-240 MG per 24 hr tablet Take 1 tablet by mouth daily as needed (for allergies).    Past Month at Unknown time  . Omega-3 Fatty Acids (OMEGA-3 FISH OIL PO) Take 1,280 mg by mouth daily.   06/15/2015 at Unknown time  . ondansetron (ZOFRAN) 8 MG tablet Take 1 tablet (8 mg total) by mouth every 8 (eight) hours as needed for nausea or vomiting. 60 tablet 3 06/15/2015 at Unknown time  . OVER THE COUNTER MEDICATION Take 1 tablet by mouth daily. MED NAME: Vitamin B-17   06/15/2015 at Unknown time  . oxyCODONE (OXY IR/ROXICODONE) 5 MG immediate release tablet Take 2 tablets (10 mg total) by mouth every 6 (six) hours as needed for severe pain. 90 tablet 0 07/08/2015 at Unknown time  . pravastatin (PRAVACHOL) 40 MG tablet Take 40 mg by mouth daily.   Past Week at Unknown time  . Tetrahydrozoline HCl (EYE DROPS OP) Place 1 drop into both eyes daily as needed (dry eyes).   Past Week at Unknown time  . Alum & Mag Hydroxide-Simeth (  MAGIC MOUTHWASH) SOLN Take 5 mLs by mouth 4 (four) times daily as needed for mouth pain. 240 mL 0 several months at unknown time  . hydrocortisone (ANUSOL-HC) 2.5 % rectal cream Place 1 application rectally 2 (two) times daily. Use on hemorrhoids as needed for one week.  Call MD if not better in one week. (Patient not taking: Reported on 07/08/2015) 30 g 0 Completed Course at Unknown time  . hydrocortisone 1 % ointment Apply 1 application topically 2 (two) times daily. (Patient not taking: Reported on 07/12/2015) 30 g 0 Completed Course at Unknown time  . lidocaine-prilocaine  (EMLA) cream Apply 1 application topically as needed. 30 g 6 unknown at unknown time  . prochlorperazine (COMPAZINE) 10 MG tablet Take 1 tablet (10 mg total) by mouth every 6 (six) hours as needed for nausea or vomiting. (Patient not taking: Reported on 07/12/2015) 60 tablet 3 Completed Course at Unknown time   Scheduled:  . antiseptic oral rinse  7 mL Mouth Rinse BID  . cholecalciferol  2,000 Units Oral Daily  . enoxaparin (LOVENOX) injection  40 mg Subcutaneous Q24H  . feeding supplement (ENSURE ENLIVE)  237 mL Oral BID BM  . piperacillin-tazobactam (ZOSYN)  IV  3.375 g Intravenous Once  . pravastatin  40 mg Oral Daily   Infusions:  . sodium chloride 100 mL/hr at 06/17/15 0224   Assessment: 64 yoF c/o SOB.  Zosyn and Vancomycin per Rx for HCAP.   Goal of Therapy:  Vancomycin trough level 15-20 mcg/ml  Plan:   Zosyn 3.375 Gm IV q8h EI  Vancomycin 1Gm x1 then '500mg'$  IV q12h  F/u Scr/cultures/levels as needed  Lawana Pai R 06/17/2015,2:37 AM

## 2015-06-17 NOTE — Progress Notes (Signed)
[facility] Progress Note Patient Name: Sherry Craig DOB: 08-04-1946 MRN: [id number]   Date of Service  06/17/2015  HPI/Events of Note  Received call from Heme\Onc stated that from their standpoint, resp status is tenuous but patient is very functional up until a week ago, and this is a case that they would recommend intubation if needed.  Currently patient is on 5L with sats >90%, but very tenuous with desats with talking or going to the restroom.   eICU Interventions  Patient is currently FULL CODE     Intervention Category Intermediate Interventions: Other:  Sherry Craig 06/17/2015, 3:23 PM

## 2015-06-17 NOTE — Care Management Note (Signed)
Case Management Note  Patient Details  Name: Sherry Craig MRN: [id number] Date of Birth: 07-May-1946  Subjective/Objective:               PNA and collapsed lung     Action/Plan:Date:  June 17, 2015 U.R. performed for needs and level of care. Will continue to follow for Case Management needs.  Velva Harman, RN, BSN, [place]   [phone]   Expected Discharge Date:                  Expected Discharge Plan:  Home/Self Care  In-House Referral:  NA  Discharge planning Services  CM Consult  Post Acute Care Choice:  NA Choice offered to:  NA  DME Arranged:  N/A DME Agency:  NA  HH Arranged:  NA HH Agency:  NA  Status of Service:  In process, will continue to follow  Medicare Important Message Given:    Date Medicare IM Given:    Medicare IM give by:    Date Additional Medicare IM Given:    Additional Medicare Important Message give by:     If discussed at [place] of Stay Meetings, dates discussed:    Additional Comments:  Leeroy Cha, RN 06/17/2015, 10:39 AM

## 2015-06-17 NOTE — Discharge Instructions (Signed)

## 2015-06-17 NOTE — Progress Notes (Addendum)
Addendum  Discussed with Dr. Alvy Bimler. Reviewed case in detail. She recommends continued aggressive care including intubation if needed.  Vernell Leep, MD, FACP, FHM. [facility] Pager [phone]  If 7PM-7AM, please contact night-coverage [email or web address] Password [email or web address] 06/17/2015, 3:15 PM

## 2015-06-17 NOTE — Progress Notes (Signed)
Echocardiogram 2D Echocardiogram has been performed.  Sherry Craig 06/17/2015, 2:46 PM

## 2015-06-17 NOTE — Consult Note (Signed)
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 Currently s/p s/p cycle 4 of carboplatin and taxol (last on 7/28). Admitted on 8/2 w 3-4d h/o progressive dyspnea. CT on admit showed stable right loculated effusion and new ground glass pulmonary infiltrates. PCCM was asked to see later that day to further evaluate her dyspnea as well as comment on the right pleural effusion.   SIGNIFICANT EVENTS    STUDIES:  CT chest 8/1: 1. No CT evidence for acute pulmonary embolus.2. Right lower lobe collapse/consolidation with right pleuralthickening and right pleural effusion. Overall imaging features arenot substantially changed since the prior PET-CT.3. Mediastinal and right hilar lymphadenopathy with left lower lobe pulmonary nodule, consistent with metastatic disease.4. Interval development of interstitial and alveolar ground-glass attenuation, mainly in the left lung. Imaging features are nonspecific and differential considerations include pulmonary hemorrhage, bacterial or atypical infection, radiation pneumonitis, or lymphangitic tumor spread. 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 She is currently s/p cycle 4 of carboplatin and taxol on 7/28. She presented to the ER on 8/2 w/ 3-4 d h/o progressive dyspnea. She reports that these symptoms were first noted on 7/29, but really worsened on 7/30 and continued to progress to the point that when she arrived at the ER she was only able to complete 3-5 word phrases d/t her dyspnea. She had a CT of chest obtained that showed diffuse new ground glass changes,  really left >right, and stable loculated right pleural effusion which looked very similar in appearance when c/w recent PET scan in June 2016. She denies significant cough, denies fever, chills, or new chest pain. She did have remote sick exposure about 2 weeks prior. She denies swelling, does have fatigue. Has had occasional chest palpitations. She was admitted to the SDU setting w/ a working dx of possible PNA. PCCM was asked to see on 8/2 for the worsening of these symptoms.    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  has past surgical history that includes Tubal ligation; Fine needle aspiration (09/03/11); Refractive surgery; Diagnostic laryngoscopy; and Radical neck dissection (06/13/2012). Prior to Admission medications   Medication Sig Start Date End Date Taking? Authorizing Provider  Cholecalciferol (VITAMIN D) 2000 UNITS CAPS Take 2,000 Units by mouth daily.    Yes Historical Provider, MD  docusate sodium (COLACE) 100 MG capsule Take 1 capsule (100 mg total) by mouth 2 (two) times daily as needed for mild constipation (keep stool soft). 09/20/14  Yes Heath Lark, MD  fexofenadine-pseudoephedrine (ALLEGRA-D 24) 180-240 MG per 24 hr tablet Take 1 tablet by mouth daily as needed (for allergies).    Yes Historical Provider, MD  Omega-3 Fatty Acids (OMEGA-3 FISH OIL PO) Take 1,280  mg by mouth daily.   Yes Historical Provider, MD  ondansetron (ZOFRAN) 8 MG tablet Take 1 tablet (8 mg total) by mouth every 8 (eight)  hours as needed for nausea or vomiting. 05/14/15  Yes Heath Lark, MD  OVER THE COUNTER MEDICATION Take 1 tablet by mouth daily. MED NAME: Vitamin B-17   Yes Historical Provider, MD  oxyCODONE (OXY IR/ROXICODONE) 5 MG immediate release tablet Take 2 tablets (10 mg total) by mouth every 6 (six) hours as needed for severe pain. 06/12/15  Yes Heath Lark, MD  pravastatin (PRAVACHOL) 40 MG tablet Take 40 mg by mouth daily.   Yes Historical Provider, MD  Tetrahydrozoline HCl (EYE DROPS OP) Place 1 drop into both eyes daily as needed (dry eyes).   Yes Historical Provider, MD  Alum & Mag Hydroxide-Simeth (MAGIC MOUTHWASH) SOLN Take 5 mLs by mouth 4 (four) times daily as needed for mouth pain. 11/01/14   Heath Lark, MD  hydrocortisone (ANUSOL-HC) 2.5 % rectal cream Place 1 application rectally 2 (two) times daily. Use on hemorrhoids as needed for one week.  Call MD if not better in one week. Patient not taking: Reported on 07/09/2015 09/20/14   Heath Lark, MD  hydrocortisone 1 % ointment Apply 1 application topically 2 (two) times daily. Patient not taking: Reported on 07/13/2015 07/04/14   Heath Lark, MD  lidocaine-prilocaine (EMLA) cream Apply 1 application topically as needed. 06/11/14   Heath Lark, MD  prochlorperazine (COMPAZINE) 10 MG tablet Take 1 tablet (10 mg total) by mouth every 6 (six) hours as needed for nausea or vomiting. Patient not taking: Reported on 06/28/2015 05/14/15   Heath Lark, MD   No Known Allergies  FAMILY HISTORY:  family history includes Cancer in her maternal aunt; Coronary artery disease in her mother; Heart attack in her mother; Heart disease in her mother; Hypertension in her mother; Pulmonary embolism in her sister. SOCIAL HISTORY:  reports that she quit smoking about 5 years ago. Her smoking use included Cigarettes. She has a 30 pack-year smoking history. She has never used smokeless tobacco. She reports that she drinks about 1.2 oz of alcohol per week. She reports that she does not  use illicit drugs.  REVIEW OF SYSTEMS:   Constitutional: Negative for fever, chills, weight loss, malaise + fatigue and diaphoresis.  HENT: Negative for hearing loss, ear pain, nosebleeds, congestion, sore throat, neck pain, tinnitus and ear discharge.   Eyes: Negative for blurred vision, double vision, photophobia, pain, discharge and redness.  Respiratory: Negative for cough, hemoptysis, sputum production, shortness of breath speaking 3-4 word phrases, wheezing and stridor.   Cardiovascular: Negative for chest pain, palpitations, orthopnea, claudication, leg swelling and PND.  Gastrointestinal: Negative for heartburn, nausea, vomiting, abdominal pain, diarrhea, constipation, blood in stool and melena.  Genitourinary: Negative for dysuria, urgency, frequency, hematuria and flank pain.  Musculoskeletal: Negative for myalgias, back pain, joint pain and falls.  Skin: Negative for itching and rash.  Neurological: Negative for dizziness, tingling, tremors, sensory change, speech change, focal weakness, seizures, loss of consciousness, weakness and headaches.  Endo/Heme/Allergies: Negative for environmental allergies and polydipsia. Does not bruise/bleed easily.  SUBJECTIVE:  No distress but very SOB VITAL SIGNS: Temp:  [97.9 F (36.6 C)-98.5 F (36.9 C)] 98.2 F (36.8 C) (08/02 0745) Pulse Rate:  [88-122] 109 (08/02 1054) Resp:  [20-40] 31 (08/02 1054) BP: (109-140)/(47-81) 140/65 mmHg (08/02 1051) SpO2:  [72 %-100 %] 95 % (08/02 1054) Weight:  [52.5 kg (115 lb 11.9 oz)] 52.5 kg (  115 lb 11.9 oz) (08/02 0036) 5 liters  PHYSICAL EXAMINATION: General:  Frail 69 year old female, currently sitting up in bed. Very short of breath. + accessory muscle use.  Neuro:  Awake, alert, no focal def  HEENT:  NCAT, mild jugular distention  Cardiovascular:  Tachy rrr Lungs:  Crackles bilateral bases. Decreased right base + accessory muscle use  Abdomen:  Soft, non-tender, + bowel sounds    Musculoskeletal:  Intact  Skin:  Intact    Recent Labs Lab 06/12/15 0817 07/06/2015 2056 06/17/15 0235  NA 140 133* 137  K 4.1 4.1 3.9  CL  --  100* 105  CO2 '27 24 24  '$ BUN 12.'3 10 8  '$ CREATININE 0.8 0.60 0.61  GLUCOSE 85 110* 111*    Recent Labs Lab 06/12/15 0817 07/08/2015 2056 06/17/15 0235  HGB 9.5* 8.7* 8.8*  HCT 29.2* 27.2* 27.2*  WBC 4.7 7.2 6.8  PLT 308 254 216   Dg Chest 2 View  06/22/2015   CLINICAL DATA:  Initial encounter for shortness of breath with productive cough and right rib pain  EXAM: CHEST  2 VIEW  COMPARISON:  07/09/2015.  FINDINGS: Moderate right pleural effusion again noted with right base collapse/ consolidation persisting. There is pulmonary vascular congestion without overt pulmonary edema. Now with some parahilar airspace disease on the left. Cardiopericardial silhouette is at upper limits of normal for size. Left Port-A-Cath again noted with tip overlying the mid SVC. Telemetry leads overlie the chest. Imaged bony structures of the thorax are intact.  IMPRESSION: Stable small to moderate right pleural effusion with right base collapse/ consolidation.  Interval development of vascular congestion left parahilar airspace disease on the current study. Underlying component of pulmonary edema could have this appearance.   Electronically Signed   By: Misty Stanley M.D.   On: 06/23/2015 20:57   Ct Angio Chest Pe W/cm &/or Wo Cm  07/15/2015   CLINICAL DATA:  Subsequent encounter for feeling shortness of breath since yesterday with weakness. 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  EXAM: CT ANGIOGRAPHY CHEST WITH CONTRAST  TECHNIQUE: Multidetector CT imaging of the chest was performed using the standard protocol during bolus administration of intravenous contrast. Multiplanar CT image reconstructions and MIPs were obtained to evaluate the vascular anatomy.  CONTRAST:  139m OMNIPAQUE IOHEXOL 350 MG/ML SOLN  COMPARISON:  PET-CT from 05/02/2015.   FINDINGS: Mediastinum / Lymph Nodes: There is no filling defect within the opacified pulmonary arteries to suggest the presence of an acute pulmonary embolus. No evidence for dissection of the thoracic aorta.  No axillary lymphadenopathy. 17 mm short axis precarinal lymph node is not substantially changed in the interval. 16 mm short axis subcarinal lymph node is again noted. There is a 16 mm short axis lymph node in the right hilum. Heart is mildly enlarged. No pericardial effusion.  Lungs / Pleura: Lung windows show areas of interstitial lung ground-glass attenuation in the lungs bilaterally, left much greater than right. Volume loss in the right hemi thorax is stable. The fluid seen previously in the right major fissure has decreased. Left infrahilar nodule measures 2.1 cm today compared to 2.2 cm previously. There is right lower lobe collapse/consolidation. Right pleural effusion is not substantially changed in the interval.  MSK / Soft Tissues: Bone windows reveal no worrisome lytic or sclerotic osseous lesions.  Upper Abdomen: Heterogeneous perfusion noted in the liver, nonspecific. Small cyst in the posterior left kidney measures 11 mm.  Review of  the MIP images confirms the above findings.  IMPRESSION: 1. No CT evidence for acute pulmonary embolus. 2. Right lower lobe collapse/consolidation with right pleural thickening and right pleural effusion. Overall imaging features are not substantially changed since the prior PET-CT. 3. Mediastinal and right hilar lymphadenopathy with left lower lobe pulmonary nodule, consistent with metastatic disease. 4. Interval development of interstitial and alveolar ground-glass attenuation, mainly in the left lung. Imaging features are nonspecific and differential considerations include pulmonary hemorrhage, bacterial or atypical infection, radiation pneumonitis, or lymphangitic tumor spread.   Electronically Signed   By: Misty Stanley M.D.   On: 06/27/2015 23:01     ASSESSMENT / PLAN:  Acute hypoxic respiratory failure (room air sats 86% on admit) in the setting of diffuse ground glass pulmonary infiltrates L>R: d-dx of concern primarily chemo-induced pneumonitis vs infectious (either bacterial or viral) vs lymphangitic vs edema (the last two seem less likely both clinically and radiographically)   Plan Cont current abx Send resp viral panel Will start on steroid trial for pneumonitis  F/u CXR in am 8/3  Right malignant pleural effusion: this has not changed significantly radiographically and is would not explain the level of symptom burden she is experiencing.  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  Plan Per heme/onc; although if Resp viral panel is negative and pt improves w/ steroids may need to reconsider chemo-therapy regimen.   Pulmonary and [place] Pager: [phone]  06/17/2015, 11:53 AM   She has hx of hypopharyngeal cancer.  She has hx of radiation tx several yrs ago.  She was recently restarted on chemotherapy.  She has new GGO Lt > Rt on CT chest.  Pleural effusion on Rt seems stable, and not large enough on u/s to warrant thoracentesis.  She is dyspneic while speaking.  Denies chest pain.  Has cough productive of yellow sputum.  Thin, using some accessory muscles, b/l crackles, decreased BS at Rt base, heart rate regular, abd soft, decreased muscle bulk.  Labs show anemia.  Procalcitonin 0.19.  Mostly likely infectious or inflammatory process causing GGO, and could also have bacterial pneumonia in RLL.  Agree with current abx.  Will add solumedrol.  Will check respiratory viral panel.  If not infectious, then concern is for chemo  induced pneumonitis.  If no improvement, then she might need bronchoscopy.  Chesley Mires, MD [facility] Pulmonary/Critical Care 06/17/2015, 1:26 PM Pager:  [phone] After 3pm call: [phone]

## 2015-06-17 NOTE — Consult Note (Signed)
[place]  [phone]   Requesting Provider: [facility]  Consulting Provider: Jana Hakim  Primary Oncologist: Jana Hakim  Patient Care Team: Lester Kinsman, PA-C as PCP - General (Physician Assistant) Thea Silversmith, MD (Radiation Oncology) Jerrell Belfast, MD (Otolaryngology) Heath Lark, MD as Consulting Physician (Hematology and Oncology) Leota Sauers, RN as Oncology Nurse Navigator (Oncology) Karie Mainland, RD as Dietitian (Nutrition)  HOSPITAL CONSULTATION  NOTE I have seen the patient, examined her and edited the notes as follows  HPI: [name] is a 69 year old woman with a 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 She reported increasing shortness of breath with minimal exertion and non productive cough since her last chemo. Denies fevers, chills, night sweats, vision changes, or mucositis. Denies any chest pain or palpitations, but she was tachycardic. Denies lower extremity swelling. Denies nausea, heartburn. Had some loose bowels this morning without recurrence. Appetite is decreased. Denies any dysuria. Denies abnormal skin rashes; she has minor, chemo induced neuropathy. Denies any bleeding issues such as epistaxis, hematemesis, hematuria or hematochezia.  Chest X ray on 8/2 showed Stable small to moderate right pleural effusion with right base collapse/ consolidation, with possible pulmonary edema. CT angio chest on 8/1 was negative for pulmonary embolism, similar chest x ray findings, suspicious for pneumonia. She was transferred to to the SDU, and began management by CCM/ Pulmonary, with IV antibiotics and supportive therapy. Cultures were obtained, results pending. Her Hb is stable at 8.8 without any abnormalities on her WBC or Platelets.While her respiratory issues are being  managed, we were kindly informed of the patient's admission The patient had difficulties finishing a sentence.  Oncology History   Cancer of hypopharynx, base of tongue, squamous cell carcinoma, HPV focally positive   Primary site: Pharynx - Hypopharynx   Staging method: AJCC 7th Edition   Clinical: Stage III (T2, N1, M0) signed by Thea Silversmith, MD on 10/07/2011 11:50 AM   Summary: Stage III (T2, N1, M0)       Cancer of hypopharynx   07/21/2011 Pathology Results Neck fine needle aspirate was negative for malignancy.   09/03/2011 Pathology Results Right neck fine needle aspirate and biopsy confirmed malignancy, squamous cell carcinoma, HPV marginally positive.   09/30/2011 Imaging PET CT scan confirmed right hypopharynx mass with regional lymphadenopathy.   10/28/2011 - 12/17/2011 Radiation Therapy The patient completed radiation therapy.   10/28/2011 - 12/23/2011 Chemotherapy The patient received 3 doses of high dose cisplatin. Her last dose of chemotherapy have 25% dose adjustment due to side effects.   03/16/2012 Imaging Repeat PET scan confirmed near complete response to treatment.   05/23/2012 Imaging Repeat PET/CT scan confirmed recurrence of disease in the right neck lymph node.   06/14/2012 Pathology Results Pathology confirmed only one lymph node involvement.   06/14/2012 Surgery The patient underwent right neck dissection for residual and recurrence of disease.   04/17/2014 Imaging Chest x-ray show multiple pulmonary nodules. CT scan of the neck showed no evidence of recurrence of disease.   04/18/2014 Imaging CT chest confirmed multiple pulmonary nodules highly suspicious for metastatic disease.   05/21/2014 Imaging PET CT scan showed multiple pulmonary nodules which are hypermetabolic.   05/27/2014 Procedure [id number] CT-guided biopsy confirms malignant cells, compatible with squamous cell carcinoma.   06/13/2014 - 10/31/2014 Chemotherapy She completed 6 cycles of  palliative treatment with  carboplatin, 5-FU and Erbitux   07/25/2014 Adverse Reaction Cycle 3 chemo is held and delayed due to leukopenia   08/20/2014 Imaging CT scan showed partial response to treatment.   11/13/2014 Imaging PET CT scan showed stable disease   11/14/2014 - 01/23/2015 Chemotherapy She received maintenance Erbitux every 2 weeks   02/05/2015 Imaging PET CT scan showed disease progression with new lymphadenopathy and new bone lesions.   04/10/2015 - 04/11/2015 Hospital Admission She was admited to an outside hospital for thoracentesis for large pleural effusion. 1700 ml was removed from the right side with malignant cytology   04/23/2015 Imaging CXR showed moderate right lung effusion   05/02/2015 Imaging PET scan showed widespread lymphagitic spread, moderate effusion and adrenal metastasis  Metastasis to bone  05/15/15                            Chemotherapy           D1C1 with Carboplatin and Taxol  05/22/15                              Chemotherapy           D1C2 with Carboplatin and Taxol  05/29/15                            Chemotherapy           D1C3  with Carboplatin and Taxol  06/12/15                            Chemotherapy           D1C4  with Carboplatin and Taxol   06/17/15                           Hospital Admission    Patient admitted for Health Care Acquired Pneumonia    Past Medical History  Diagnosis Date  . Hyperlipemia   . Allergy   . Recurrent sinus infections   . GERD (gastroesophageal reflux disease)   . Osteopenia   . Sinus infection     History of  . S/P radiation therapy 10/28/2011 - 12/17/2011    70 Gy to right Hypopharynx, involved lymph nodes, uninvolved lymph nodes  . Status post chemotherapy Started 10/28/2011    Q3Wk Cisplatin concurrent with Radiation Therapy  . H/O exercise stress test     done  >15 yrs. ago due to abnormal EKG.  Stress test was wnl, no need for f/u   . Arthritis     hands & feet   . Mucositis (ulcerative) due to antineoplastic therapy 06/20/2014  . Acne  07/04/2014  . Rash of face 07/04/2014  . 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 Tongue cancer   . 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 Lung cancer    REVIEW OF SYSTEMS:  Constitutional: Denies fevers, chills  Eyes: Denies blurriness of vision Ears, nose, mouth, throat, and face: Denies mucositis or sore throat Respiratory: She reports non productive cough, with profound dyspnea, denies wheezes Cardiovascular: Denies palpitation, chest discomfort or lower extremity swelling Gastrointestinal: Denies nausea, heartburn , she reports 1 episode of loose stools without recurrence Skin: Denies abnormal skin rashes Lymphatics: Denies  new lymphadenopathy or easy bruising Neurological: She has minor neuropathy, deniesnew weaknesses Behavioral/Psych: Mood is stable, no new changes  All other systems were reviewed with the patient and are negative. ECOG PS: 2 MEDICATIONS:  Scheduled Meds: . antiseptic oral rinse  7 mL Mouth Rinse BID  . cholecalciferol  2,000 Units Oral Daily  . enoxaparin (LOVENOX) injection  40 mg Subcutaneous Q24H  . feeding supplement (ENSURE ENLIVE)  237 mL Oral BID BM  . piperacillin-tazobactam (ZOSYN)  IV  3.375 g Intravenous Q8H  . pravastatin  40 mg Oral Daily  . vancomycin  500 mg Intravenous Q12H   Continuous Infusions: . sodium chloride 100 mL/hr at 06/17/15 0224   PRN Meds:.acetaminophen **OR** acetaminophen, docusate sodium, ondansetron **OR** ondansetron (ZOFRAN) IV, ondansetron, oxyCODONE  ALLERGIES: No Known Allergies  Family History  Problem Relation Age of Onset  . Coronary artery disease Mother   . Heart disease Mother   . Hypertension Mother   . Heart attack Mother   . Pulmonary embolism Sister   . Cancer Maternal Aunt     bladder ca     Past Surgical History  Procedure Laterality Date  . Tubal ligation    . Fine needle aspiration  09/03/11    FNA to right Neck Mass - Squamous Cell Carcinoma of the  Right Hypopharynx  . Refractive surgery      for retinal tears- seen by Dr. Baird Cancer   . Diagnostic laryngoscopy      done by Dr. Wilburn Cornelia- 07/2011- to  be done for diagnosis of CA of hypopharynyx   . Radical neck dissection  06/13/2012    Procedure: RADICAL NECK DISSECTION;  Surgeon: Jerrell Belfast, MD;  Location: [place];  Service: ENT;  Laterality: Right;  Right Neck Dissection     History   Social History  . Marital Status: Married    Spouse Name: N/A  . Number of Children: N/A  . Years of Education: N/A   Occupational History  . Retired Education officer, museum    Social History Main Topics  . Smoking status: Former Smoker -- 1.00 packs/day for 30 years    Types: Cigarettes    Quit date: 11/15/2009  . Smokeless tobacco: Never Used  . Alcohol Use: 1.2 oz/week    2 Glasses of wine per week     Comment: none since late 2012  . Drug Use: No  . Sexual Activity: Not on file   Other Topics Concern  . Not on file   Social History Narrative     PHYSICAL EXAMINATION:  Filed Vitals:   06/17/15 0800  BP: 133/60  Pulse: 98  Temp:   Resp: 24      Intake/Output Summary (Last 24 hours) at 06/17/15 1012 Last data filed at 06/17/15 1000  Gross per 24 hour  Intake 1458.33 ml  Output    625 ml  Net 833.33 ml   GENERAL:alert, in moderate respiratory distress and comfortable. She looks thin SKIN: skin color, texture, turgor are normal, no rashes or significant lesions EYES: normal, Conjunctiva are pink and non-injected, sclera clear OROPHARYNX:no exudate, no erythema and lips, buccal mucosa, and tongue normal  NECK: Well-healed surgical scar around the neck. LYMPH: no palpable lymphadenopathy in the cervical, axillary or inguinal LUNGS: She had significant increased breathing effort with persistent dullness on percussion and reduced breath sounds on the right lung base. She can barely finish a sentence HEART: regular rate & rhythm and no murmurs and no lower extremity  edema ABDOMEN:abdomen soft,  non-tender and normal bowel sounds Musculoskeletal:no cyanosis of digits and no clubbing  NEURO: alert & oriented x 3 with fluent speech, no focal motor/sensory deficits  LABORATORY/RADIOLOGY DATA:   Recent Labs Lab 06/12/15 0817 07/06/2015 2056 06/17/15 0235  WBC 4.7 7.2 6.8  HGB 9.5* 8.7* 8.8*  HCT 29.2* 27.2* 27.2*  PLT 308 254 216  MCV 87.9 86.9 86.6  MCH 28.5 27.8 28.0  MCHC 32.4 32.0 32.4  RDW 18.1* 18.3* 18.2*  LYMPHSABS 0.7* 0.9 0.8  MONOABS 0.6 0.3 0.3  EOSABS 0.1 0.1 0.0  BASOSABS 0.1 0.0 0.0    CMP    Recent Labs Lab 06/12/15 0817 07/02/2015 2056 06/17/15 0235  NA 140 133* 137  K 4.1 4.1 3.9  CL  --  100* 105  CO2 '27 24 24  '$ GLUCOSE 85 110* 111*  BUN 12.'3 10 8  '$ CREATININE 0.8 0.60 0.61  CALCIUM 10.1 9.1 8.6*  AST '15 22 19  '$ ALT 6 12* 11*  ALKPHOS 94 76 75  BILITOT 0.25 0.6 0.6        Component Value Date/Time   BILITOT 0.6 06/17/2015 0235   BILITOT 0.25 06/12/2015 0817      Recent Labs  06/17/15 0237  TSH 1.743       Urinalysis    Component Value Date/Time   COLORURINE YELLOW 06/24/2015 2151   APPEARANCEUR CLEAR 07/03/2015 2151   LABSPEC 1.006 07/14/2015 2151   PHURINE 6.0 07/03/2015 2151   GLUCOSEU NEGATIVE 07/02/2015 2151   HGBUR SMALL* 06/19/2015 2151   BILIRUBINUR NEGATIVE 06/28/2015 2151   KETONESUR NEGATIVE 06/21/2015 2151   PROTEINUR NEGATIVE 07/11/2015 2151   UROBILINOGEN 0.2 07/15/2015 2151   NITRITE NEGATIVE 07/08/2015 2151   LEUKOCYTESUR NEGATIVE 07/16/2015 2151     Liver Function Tests:  Recent Labs Lab 06/12/15 0817 06/26/2015 2056 06/17/15 0235  AST '15 22 19  '$ ALT 6 12* 11*  ALKPHOS 94 76 75  BILITOT 0.25 0.6 0.6  PROT 7.2 7.4 6.6  ALBUMIN 3.2* 3.4* 3.0*   Cardiac Enzymes:  Recent Labs Lab 06/17/15 0655  TROPONINI <0.03    Thyroid function studies  Recent Labs  06/17/15 0237  TSH 1.743    Radiology Studies: I reviewed the CT scan myself  Dg Chest 2  View  07/15/2015   CLINICAL DATA:  Initial encounter for shortness of breath with productive cough and right rib pain  EXAM: CHEST  2 VIEW  COMPARISON:  07/09/2015.  FINDINGS: Moderate right pleural effusion again noted with right base collapse/ consolidation persisting. There is pulmonary vascular congestion without overt pulmonary edema. Now with some parahilar airspace disease on the left. Cardiopericardial silhouette is at upper limits of normal for size. Left Port-A-Cath again noted with tip overlying the mid SVC. Telemetry leads overlie the chest. Imaged bony structures of the thorax are intact.  IMPRESSION: Stable small to moderate right pleural effusion with right base collapse/ consolidation.  Interval development of vascular congestion left parahilar airspace disease on the current study. Underlying component of pulmonary edema could have this appearance.   Electronically Signed   By: Misty Stanley M.D.   On: 06/23/2015 20:57   Ct Angio Chest Pe W/cm &/or Wo Cm  06/29/2015   CLINICAL DATA:  Subsequent encounter for feeling shortness of breath since yesterday with weakness. 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  EXAM: CT ANGIOGRAPHY CHEST WITH CONTRAST  TECHNIQUE: Multidetector CT imaging of the chest was performed using the standard protocol during bolus  administration of intravenous contrast. Multiplanar CT image reconstructions and MIPs were obtained to evaluate the vascular anatomy.  CONTRAST:  160m OMNIPAQUE IOHEXOL 350 MG/ML SOLN  COMPARISON:  PET-CT from 05/02/2015.  FINDINGS: Mediastinum / Lymph Nodes: There is no filling defect within the opacified pulmonary arteries to suggest the presence of an acute pulmonary embolus. No evidence for dissection of the thoracic aorta.  No axillary lymphadenopathy. 17 mm short axis precarinal lymph node is not substantially changed in the interval. 16 mm short axis subcarinal lymph node is again noted. There is a 16 mm short axis  lymph node in the right hilum. Heart is mildly enlarged. No pericardial effusion.  Lungs / Pleura: Lung windows show areas of interstitial lung ground-glass attenuation in the lungs bilaterally, left much greater than right. Volume loss in the right hemi thorax is stable. The fluid seen previously in the right major fissure has decreased. Left infrahilar nodule measures 2.1 cm today compared to 2.2 cm previously. There is right lower lobe collapse/consolidation. Right pleural effusion is not substantially changed in the interval.  MSK / Soft Tissues: Bone windows reveal no worrisome lytic or sclerotic osseous lesions.  Upper Abdomen: Heterogeneous perfusion noted in the liver, nonspecific. Small cyst in the posterior left kidney measures 11 mm.  Review of the MIP images confirms the above findings.  IMPRESSION: 1. No CT evidence for acute pulmonary embolus. 2. Right lower lobe collapse/consolidation with right pleural thickening and right pleural effusion. Overall imaging features are not substantially changed since the prior PET-CT. 3. Mediastinal and right hilar lymphadenopathy with left lower lobe pulmonary nodule, consistent with metastatic disease. 4. Interval development of interstitial and alveolar ground-glass attenuation, mainly in the left lung. Imaging features are nonspecific and differential considerations include pulmonary hemorrhage, bacterial or atypical infection, radiation pneumonitis, or lymphangitic tumor spread.   Electronically Signed   By: EMisty StanleyM.D.   On: 06/19/2015 23:01   ASSESSMENT AND PLAN:  Cancer of hypopharynx  She is s/p Cycle 4 chemotherapy with Carboplatin and Taxol  D1 C4 on 06/12/15 She tolerated treatment well without any side effects. I compared her imaging study between the PET CT scan prior to treatment and CT scan of the chest dated yesterday Overall, she is improving with treatment  Continue supportive care for now   Acute respiratory failure with hypoxia.   She reported increasing shortness of breath with minimal exertion and non productive cough since her last chemo. Chest X ray on 8/2 showed Stable small to moderate right pleural effusion with right base collapse/ consolidation, with possible pulmonary edema.  CT angio chest on 8/1 was negative for pulmonary embolism, similar chest x ray findings, suspicious for pneumonia.  She was transferred to to the SDU, and began management by CCM/ Pulmonary, with IV antibiotics and supportive therapy.  Cultures were obtained, results pending. Appreciate admitting team involvement.  I'm concerned that she may need intubation for respiratory failure Due to the fact that she appears to be responding to treatment on recent imaging study, I recommend aggressive care Infiltrate on the left lung is highly suspicious for infection. Less likely would be side effects related to chemotherapy. I recommend continue high-dose stable warts for now  Anemia due to antineoplastic chemotherapy This is likely due to recent treatment. The patient denies recent history of bleeding such as epistaxis, hematuria or hematochezia.  She is asymptomatic from the anemia.Will observe for now.  She does not require transfusion now.   If the anemia gets progressive  worse in the future, might have to delay her treatment or adjust the chemotherapy dose.  Metastasis to bone She has minor bone pain, controlled with taking oxycodone. This is stable. 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 Will observe for neuropathy closely. Will consider dose adjustment in the future if she had worsening neuropathy.  Protein calorie malnutrition This is moderate to severe. Albumin level is low and she has lost 12 pounds in 3 months, although better since her recent thoracentesis Recommend increase oral intake as tolerated Consider nutrition involvement  DVT prophylaxis On  Lovenox  Full Code Other medical issues as per admitting team   Will follow  Rondel Jumbo, PA-C 06/17/2015, 10:12 AM Faustino Luecke, MD 06/17/2015

## 2015-06-17 NOTE — Progress Notes (Signed)
Initial Nutrition Assessment  DOCUMENTATION CODES:   Non-severe (moderate) malnutrition in context of chronic illness  INTERVENTION:   Continue Ensure Enlive po BID, each supplement provides 350 kcal and 20 grams of protein Encouraged PO intake RD to continue to monitor  NUTRITION DIAGNOSIS:   Malnutrition related to chronic illness as evidenced by percent weight loss, moderate depletion of body fat, moderate depletions of muscle mass.  GOAL:   Patient will meet greater than or equal to 90% of their needs  MONITOR:   PO intake, Supplement acceptance, Labs, Weight trends, Skin, I & O's  REASON FOR ASSESSMENT:   Malnutrition Screening Tool    ASSESSMENT:   45 yof w/ h/o recurrent head/neck cancer w/ known mets to bone, lung and right pleural space. Currently s/p s/p cycle 4 of carboplatin and taxol (last on 7/28). Admitted on 8/2 w 3-4d h/o progressive dyspnea.   Pt followed by [facility], last seen 7/14.  Pt reports she ate well today. RD observed she had eaten 75% of lunch tray. Pt reports she drinks Ensure supplements at home and is drinking them here.   Per weight history documentation, pt has lost 19 lb since 1/28 (14% weight loss x 6 months, significant for time frame).  Nutrition-Focused physical exam completed. Findings are moderate fat depletion, moderate muscle depletion, and no edema.   Labs reviewed.   Diet Order:  Diet Heart Room service appropriate?: Yes; Fluid consistency:: Thin  Skin:  Reviewed, no issues  Last BM:  8/1  Height:   Ht Readings from Last 1 Encounters:  06/17/15 4' 11.5" (1.511 m)    Weight:   Wt Readings from Last 1 Encounters:  06/17/15 115 lb 11.9 oz (52.5 kg)    Ideal Body Weight:  45 kg  BMI:  Body mass index is 22.99 kg/(m^2).  Estimated Nutritional Needs:   Kcal:  1600-1800  Protein:  75-85g  Fluid:  1.7L/day  EDUCATION NEEDS:   No education needs identified at this time  Clayton Bibles, MS, RD,  LDN Pager: [phone] After Hours Pager: [phone]

## 2015-06-17 NOTE — H&P (Addendum)
[facility] History and Physical  Cortney Beissel IOX:[id number] DOB: Jun 16, 1946 DOA: 06/24/2015  Referring physician: Dr.Floyd. PCP: Lester Kinsman, PA-C  Specialists: Dr.Gorsuch.  Chief Complaint: Shortness of breath.  HPI: Sherry Craig is a 69 y.o. 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 Patient states that since her chemotherapy patient has been feeling short of breath with minimal exertion. Denies any chest pain. Has been having some nonproductive cough. In the ER patient had CT angiogram of the chest which shows negative for PE but -  Right lower lobe collapse/consolidation with right pleural thickening and right pleural effusion. Overall imaging features are not substantially changed since the prior PET-CT. Mediastinal and right hilar lymphadenopathy with left lower lobe pulmonary nodule, consistent with metastatic disease. Interval development of interstitial and alveolar ground-glass attenuation, mainly in the left lung. Imaging features are nonspecific and differential considerations include pulmonary hemorrhage, bacterial or atypical infection, radiation pneumonitis, or lymphangitic tumor spread.  Patient denies any fever or chills productive cough or hemoptysis. In the ER patient was empirically started on antibiotics for possible healthcare associated pneumonia and admitted for further management. Patient was tachycardic with a heart rate in the 130s initially which improved with fluid bolus. On exam patient is not in distress.    Review of Systems: As presented in the history of presenting illness, rest negative.  Past Medical History  Diagnosis Date  . Hyperlipemia   . Allergy   . Recurrent sinus infections   . GERD (gastroesophageal reflux disease)   . Osteopenia   . Sinus infection     History  of  . S/P radiation therapy 10/28/2011 - 12/17/2011    70 Gy to right Hypopharynx, involved lymph nodes, uninvolved lymph nodes  . Status post chemotherapy Started 10/28/2011    Q3Wk Cisplatin concurrent with Radiation Therapy  . H/O exercise stress test     done  >15 yrs. ago due to abnormal EKG.  Stress test was wnl, no need for f/u   . Arthritis     hands & feet   . Mucositis (ulcerative) due to antineoplastic therapy 06/20/2014  . Acne 07/04/2014  . Rash of face 07/04/2014  . 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 Tongue cancer   . 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 Lung cancer    Past Surgical History  Procedure Laterality Date  . Tubal ligation    . Fine needle aspiration  09/03/11    FNA to right Neck Mass - Squamous Cell Carcinoma of the Right Hypopharynx  . Refractive surgery      for retinal tears- seen by Dr. Baird Cancer   . Diagnostic laryngoscopy      done by Dr. Wilburn Cornelia- 07/2011- to  be done for diagnosis of CA of hypopharynyx   . Radical neck dissection  06/13/2012    Procedure: RADICAL NECK DISSECTION;  Surgeon: Jerrell Belfast, MD;  Location: [facility];  Service: ENT;  Laterality: Right;  Right Neck Dissection    Social History:  reports that she quit smoking about 5 years ago. Her smoking use included Cigarettes. She has a 30 pack-year smoking history. She has never used smokeless tobacco. She reports that she drinks about 1.2 oz of alcohol per week. She reports that she does not use illicit drugs. Where does patient live home. Can patient participate in  ADLs?Yes.   No Known Allergies  Family History:  Family History  Problem Relation Age of Onset  . Coronary artery disease Mother   . Heart disease Mother   . Hypertension Mother   . Heart attack Mother   . Pulmonary embolism Sister   . Cancer Maternal Aunt     bladder ca      Prior to Admission medications   Medication Sig Start Date End Date Taking? Authorizing Provider   Cholecalciferol (VITAMIN D) 2000 UNITS CAPS Take 2,000 Units by mouth daily.    Yes Historical Provider, MD  docusate sodium (COLACE) 100 MG capsule Take 1 capsule (100 mg total) by mouth 2 (two) times daily as needed for mild constipation (keep stool soft). 09/20/14  Yes Heath Lark, MD  fexofenadine-pseudoephedrine (ALLEGRA-D 24) 180-240 MG per 24 hr tablet Take 1 tablet by mouth daily as needed (for allergies).    Yes Historical Provider, MD  Omega-3 Fatty Acids (OMEGA-3 FISH OIL PO) Take 1,280 mg by mouth daily.   Yes Historical Provider, MD  ondansetron (ZOFRAN) 8 MG tablet Take 1 tablet (8 mg total) by mouth every 8 (eight) hours as needed for nausea or vomiting. 05/14/15  Yes Heath Lark, MD  OVER THE COUNTER MEDICATION Take 1 tablet by mouth daily. MED NAME: Vitamin B-17   Yes Historical Provider, MD  oxyCODONE (OXY IR/ROXICODONE) 5 MG immediate release tablet Take 2 tablets (10 mg total) by mouth every 6 (six) hours as needed for severe pain. 06/12/15  Yes Heath Lark, MD  pravastatin (PRAVACHOL) 40 MG tablet Take 40 mg by mouth daily.   Yes Historical Provider, MD  Tetrahydrozoline HCl (EYE DROPS OP) Place 1 drop into both eyes daily as needed (dry eyes).   Yes Historical Provider, MD  Alum & Mag Hydroxide-Simeth (MAGIC MOUTHWASH) SOLN Take 5 mLs by mouth 4 (four) times daily as needed for mouth pain. 11/01/14   Heath Lark, MD  hydrocortisone (ANUSOL-HC) 2.5 % rectal cream Place 1 application rectally 2 (two) times daily. Use on hemorrhoids as needed for one week.  Call MD if not better in one week. Patient not taking: Reported on 06/27/2015 09/20/14   Heath Lark, MD  hydrocortisone 1 % ointment Apply 1 application topically 2 (two) times daily. Patient not taking: Reported on 07/15/2015 07/04/14   Heath Lark, MD  lidocaine-prilocaine (EMLA) cream Apply 1 application topically as needed. 06/11/14   Heath Lark, MD  prochlorperazine (COMPAZINE) 10 MG tablet Take 1 tablet (10 mg total) by mouth every 6  (six) hours as needed for nausea or vomiting. Patient not taking: Reported on 07/02/2015 05/14/15   Heath Lark, MD    Physical Exam: Filed Vitals:   06/29/2015 2021 06/19/2015 2034 06/27/2015 2344 06/17/15 0036  BP: 132/62  139/80 131/81  Pulse: 122  117 118  Temp: 98.5 F (36.9 C)   98.2 F (36.8 C)  TempSrc: Oral     Resp: 20  39 25  Height:    4' 11.5" (1.511 m)  Weight:    52.5 kg (115 lb 11.9 oz)  SpO2: 86% 97% 96% 98%     General:  Moderately built and bony nourished.  Eyes: Anicteric no pallor.  ENT: No discharge from the ears eyes nose and mouth.  Neck: No mass felt.  Cardiovascular: S1-S2 heard. Tachycardic.  Respiratory: No rhonchi or crepitations.  Abdomen: Soft nontender bowel sounds present.  Skin: No rash.  Musculoskeletal: No edema.  Psychiatric: Appears normal.  Neurologic: Alert awake oriented  to time place and person. Moves all extremities.  Labs on Admission:  Basic Metabolic Panel:  Recent Labs Lab 06/12/15 0817 07/16/2015 2056  NA 140 133*  K 4.1 4.1  CL  --  100*  CO2 27 24  GLUCOSE 85 110*  BUN 12.3 10  CREATININE 0.8 0.60  CALCIUM 10.1 9.1   Liver Function Tests:  Recent Labs Lab 06/12/15 0817 07/11/2015 2056  AST 15 22  ALT 6 12*  ALKPHOS 94 76  BILITOT 0.25 0.6  PROT 7.2 7.4  ALBUMIN 3.2* 3.4*   No results for input(s): LIPASE, AMYLASE in the last 168 hours. No results for input(s): AMMONIA in the last 168 hours. CBC:  Recent Labs Lab 06/12/15 0817 07/16/2015 2056  WBC 4.7 7.2  NEUTROABS 3.2 6.0  HGB 9.5* 8.7*  HCT 29.2* 27.2*  MCV 87.9 86.9  PLT 308 254   Cardiac Enzymes: No results for input(s): CKTOTAL, CKMB, CKMBINDEX, TROPONINI in the last 168 hours.  BNP (last 3 results) No results for input(s): BNP in the last 8760 hours.  ProBNP (last 3 results) No results for input(s): PROBNP in the last 8760 hours.  CBG: No results for input(s): GLUCAP in the last 168 hours.  Radiological Exams on Admission: Dg  Chest 2 View  07/15/2015   CLINICAL DATA:  Initial encounter for shortness of breath with productive cough and right rib pain  EXAM: CHEST  2 VIEW  COMPARISON:  07/09/2015.  FINDINGS: Moderate right pleural effusion again noted with right base collapse/ consolidation persisting. There is pulmonary vascular congestion without overt pulmonary edema. Now with some parahilar airspace disease on the left. Cardiopericardial silhouette is at upper limits of normal for size. Left Port-A-Cath again noted with tip overlying the mid SVC. Telemetry leads overlie the chest. Imaged bony structures of the thorax are intact.  IMPRESSION: Stable small to moderate right pleural effusion with right base collapse/ consolidation.  Interval development of vascular congestion left parahilar airspace disease on the current study. Underlying component of pulmonary edema could have this appearance.   Electronically Signed   By: Misty Stanley M.D.   On: 06/28/2015 20:57   Ct Angio Chest Pe W/cm &/or Wo Cm  07/03/2015   CLINICAL DATA:  Subsequent encounter for feeling shortness of breath since yesterday with weakness. 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  EXAM: CT ANGIOGRAPHY CHEST WITH CONTRAST  TECHNIQUE: Multidetector CT imaging of the chest was performed using the standard protocol during bolus administration of intravenous contrast. Multiplanar CT image reconstructions and MIPs were obtained to evaluate the vascular anatomy.  CONTRAST:  189m OMNIPAQUE IOHEXOL 350 MG/ML SOLN  COMPARISON:  PET-CT from 05/02/2015.  FINDINGS: Mediastinum / Lymph Nodes: There is no filling defect within the opacified pulmonary arteries to suggest the presence of an acute pulmonary embolus. No evidence for dissection of the thoracic aorta.  No axillary lymphadenopathy. 17 mm short axis precarinal lymph node is not substantially changed in the interval. 16 mm short axis subcarinal lymph node is again noted. There is a 16 mm short  axis lymph node in the right hilum. Heart is mildly enlarged. No pericardial effusion.  Lungs / Pleura: Lung windows show areas of interstitial lung ground-glass attenuation in the lungs bilaterally, left much greater than right. Volume loss in the right hemi thorax is stable. The fluid seen previously in the right major fissure has decreased. Left infrahilar nodule measures 2.1 cm today compared to 2.2 cm previously. There is  right lower lobe collapse/consolidation. Right pleural effusion is not substantially changed in the interval.  MSK / Soft Tissues: Bone windows reveal no worrisome lytic or sclerotic osseous lesions.  Upper Abdomen: Heterogeneous perfusion noted in the liver, nonspecific. Small cyst in the posterior left kidney measures 11 mm.  Review of the MIP images confirms the above findings.  IMPRESSION: 1. No CT evidence for acute pulmonary embolus. 2. Right lower lobe collapse/consolidation with right pleural thickening and right pleural effusion. Overall imaging features are not substantially changed since the prior PET-CT. 3. Mediastinal and right hilar lymphadenopathy with left lower lobe pulmonary nodule, consistent with metastatic disease. 4. Interval development of interstitial and alveolar ground-glass attenuation, mainly in the left lung. Imaging features are nonspecific and differential considerations include pulmonary hemorrhage, bacterial or atypical infection, radiation pneumonitis, or lymphangitic tumor spread.   Electronically Signed   By: Misty Stanley M.D.   On: 07/08/2015 23:01    EKG: Independently reviewed. Sinus tachycardia.  Assessment/Plan Principal Problem:   Acute respiratory failure with hypoxia Active Problems:   Cancer of hypopharynx   Anemia due to antineoplastic chemotherapy   1. Acute respiratory failure with hypoxia - cause not clear. At this time patient has been empirically started on antibiotics for healthcare associated pneumonia. Follow blood cultures  and procalcitonin levels. I have also ordered 2-D echo. I have discussed with on-call pulmonary critical care Dr. Elsworth Soho who at this time has advised to observe and continue present medications. Consult pulmonary for any further worsening. 2. Cancer of hypopharynx status post recent chemotherapy - per oncology. 3. Anemia secondary to chemotherapy and cancer - follow CBC.  I have discussed with on-call pulmonary critical care and reviewed patient's old chart and labs. Personally reviewed patient's chest x-ray and EKG.   DVT Prophylaxis Lovenox.  Code Status: Full code.  Family Communication: Discussed with patient.  Disposition Plan: Admit to inpatient.    Clee Pandit N. [facility] Pager [phone].  If 7PM-7AM, please contact night-coverage [email or web address] Password [email or web address] 06/17/2015, 2:14 AM

## 2015-06-17 NOTE — Progress Notes (Addendum)
PROGRESS NOTE    Sherry Craig GUR:[id number] DOB: 1946/08/11 DOA: 06/18/2015 PCP: Lester Kinsman, PA-C  Primary Oncologist: Dr. 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 In the ED, patient was initially tachycardic in the 130s and oxygen saturation of 86% on room air. CTA chest: Negative for PE, showed RLL collapse/consolidation with pleural thickening and right pleural effusion, interval development of interstitial and alveolar groundglass attenuation mainly in the left lung with broad differential diagnosis including pulmonary hemorrhage, infection, radiation pneumonitis or lymphangitic spread of tumor. Currently treating for healthcare associated pneumonia. CCM consulted for right thoracentesis.   Assessment/Plan:  Possible healthcare associated pneumonia - Based on symptoms and CTA chest findings. Other DD-all her findings may be related to lymphangitic spread of cancer. Less likely to be pulmonary hemorrhage. - Treating empirically with IV vancomycin and Zosyn-continue same. - Urine streptococcal antigen: Negative. - Blood cultures 2, urine Legionella antigen: Pending  Right pleural effusion - Per CT report, not significantly changed compared to PET scan on 05/02/15 - Patient significantly dyspneic even on talking and is requiring oxygen 5 L/m. 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  HLD - Continue statins  GERD - PPI   DVT prophylaxis: Lovenox Code Status: Full Family Communication: Discussed with spouse at bedside on 8/2 Disposition Plan: Continue treatment in stepdown unit for additional 24 hours. DC home when medically stable.   Consultants:  PCCM  Procedures:  None  Antibiotics:  IV Zosyn 8/1 >  IV vancomycin 8/1 >  Subjective: Dyspnea on minimal exertion. Cough intermittently productive of yellow sputum.  Objective: Filed Vitals:   06/17/15 0700 06/17/15 0745 06/17/15 0800 06/17/15 0959  BP:   133/60   Pulse:   98   Temp:  98.2 F (36.8 C)    TempSrc:  Oral    Resp: 29  24   Height:      Weight:      SpO2: 97%  94% 93%    Intake/Output Summary (Last 24 hours) at 06/17/15 1054 Last data filed at 06/17/15 1036  Gross per 24 hour  Intake 1458.33 ml  Output    627 ml  Net 831.33 ml   Filed Weights   06/17/15 0036  Weight: 52.5 kg (115 lb 11.9 oz)     Exam:  General exam: Small built and frail middle-aged female, chronically ill-looking lying comfortably propped up in bed. Respiratory system: decreased breath sounds bilaterally, R>L, with scattered few basal crackles. No increased work of breathing. Cardiovascular system: S1 & S2 heard, RRR. No JVD, murmurs, gallops, clicks or pedal edema. Telemetry: Admitted with sinus tachycardia in the 110s which has now improved to sinus rhythm. Gastrointestinal system: Abdomen is nondistended, soft and nontender. Normal bowel sounds heard. Central nervous system: Alert and oriented. No focal neurological deficits. Extremities: Symmetric 5 x 5 power.   Data Reviewed: Basic Metabolic Panel:  Recent Labs Lab 06/12/15 0817 07/10/2015 2056 06/17/15 0235  NA 140 133* 137  K 4.1 4.1  3.9  CL  --  100* 105  CO2 '27 24 24  '$ GLUCOSE 85 110* 111*  BUN 12.'3 10 8  '$ CREATININE 0.8 0.60 0.61  CALCIUM 10.1 9.1 8.6*   Liver Function Tests:  Recent  Labs Lab 06/12/15 0817 07/16/2015 2056 06/17/15 0235  AST '15 22 19  '$ ALT 6 12* 11*  ALKPHOS 94 76 75  BILITOT 0.25 0.6 0.6  PROT 7.2 7.4 6.6  ALBUMIN 3.2* 3.4* 3.0*   No results for input(s): LIPASE, AMYLASE in the last 168 hours. No results for input(s): AMMONIA in the last 168 hours. CBC:  Recent Labs Lab 06/12/15 0817 06/19/2015 2056 06/17/15 0235  WBC 4.7 7.2 6.8  NEUTROABS 3.2 6.0 5.7  HGB 9.5* 8.7* 8.8*  HCT 29.2* 27.2* 27.2*  MCV 87.9 86.9 86.6  PLT 308 254 216   Cardiac Enzymes:  Recent Labs Lab 06/17/15 0655  TROPONINI <0.03   BNP (last 3 results) No results for input(s): PROBNP in the last 8760 hours. CBG: No results for input(s): GLUCAP in the last 168 hours.  Recent Results (from the past 240 hour(s))  MRSA PCR Screening     Status: None   Collection Time: 06/17/15 12:31 AM  Result Value Ref Range Status   MRSA by PCR NEGATIVE NEGATIVE Final    Comment:        The GeneXpert MRSA Assay (FDA approved for NASAL specimens only), is one component of a comprehensive MRSA colonization surveillance program. It is not intended to diagnose MRSA infection nor to guide or monitor treatment for MRSA infections.           Studies: Dg Chest 2 View  07/07/2015   CLINICAL DATA:  Initial encounter for shortness of breath with productive cough and right rib pain  EXAM: CHEST  2 VIEW  COMPARISON:  07/09/2015.  FINDINGS: Moderate right pleural effusion again noted with right base collapse/ consolidation persisting. There is pulmonary vascular congestion without overt pulmonary edema. Now with some parahilar airspace disease on the left. Cardiopericardial silhouette is at upper limits of normal for size. Left Port-A-Cath again noted with tip overlying the mid SVC. Telemetry leads overlie the chest. Imaged bony structures of the thorax are intact.  IMPRESSION: Stable small to moderate right pleural effusion with right base collapse/ consolidation.  Interval development  of vascular congestion left parahilar airspace disease on the current study. Underlying component of pulmonary edema could have this appearance.   Electronically Signed   By: Misty Stanley M.D.   On: 07/15/2015 20:57   Ct Angio Chest Pe W/cm &/or Wo Cm  06/26/2015   CLINICAL DATA:  Subsequent encounter for feeling shortness of breath since yesterday with weakness. 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  EXAM: CT ANGIOGRAPHY CHEST WITH CONTRAST  TECHNIQUE: Multidetector CT imaging of the chest was performed using the standard protocol during bolus administration of intravenous contrast. Multiplanar CT image reconstructions and MIPs were obtained to evaluate the vascular anatomy.  CONTRAST:  119m OMNIPAQUE IOHEXOL 350 MG/ML SOLN  COMPARISON:  PET-CT from 05/02/2015.  FINDINGS: Mediastinum / Lymph Nodes: There is no filling defect within the opacified pulmonary arteries to suggest the presence of an acute pulmonary embolus. No evidence for dissection of the thoracic aorta.  No axillary lymphadenopathy. 17 mm short axis precarinal lymph node is not substantially changed in the interval. 16 mm short axis subcarinal lymph node is again noted. There is a 16 mm short axis lymph node in the  right hilum. Heart is mildly enlarged. No pericardial effusion.  Lungs / Pleura: Lung windows show areas of interstitial lung ground-glass attenuation in the lungs bilaterally, left much greater than right. Volume loss in the right hemi thorax is stable. The fluid seen previously in the right major fissure has decreased. Left infrahilar nodule measures 2.1 cm today compared to 2.2 cm previously. There is right lower lobe collapse/consolidation. Right pleural effusion is not substantially changed in the interval.  MSK / Soft Tissues: Bone windows reveal no worrisome lytic or sclerotic osseous lesions.  Upper Abdomen: Heterogeneous perfusion noted in the liver, nonspecific. Small cyst in the posterior left  kidney measures 11 mm.  Review of the MIP images confirms the above findings.  IMPRESSION: 1. No CT evidence for acute pulmonary embolus. 2. Right lower lobe collapse/consolidation with right pleural thickening and right pleural effusion. Overall imaging features are not substantially changed since the prior PET-CT. 3. Mediastinal and right hilar lymphadenopathy with left lower lobe pulmonary nodule, consistent with metastatic disease. 4. Interval development of interstitial and alveolar ground-glass attenuation, mainly in the left lung. Imaging features are nonspecific and differential considerations include pulmonary hemorrhage, bacterial or atypical infection, radiation pneumonitis, or lymphangitic tumor spread.   Electronically Signed   By: Misty Stanley M.D.   On: 07/13/2015 23:01        Scheduled Meds: . antiseptic oral rinse  7 mL Mouth Rinse BID  . cholecalciferol  2,000 Units Oral Daily  . enoxaparin (LOVENOX) injection  40 mg Subcutaneous Q24H  . feeding supplement (ENSURE ENLIVE)  237 mL Oral BID BM  . piperacillin-tazobactam (ZOSYN)  IV  3.375 g Intravenous Q8H  . pravastatin  40 mg Oral Daily  . vancomycin  500 mg Intravenous Q12H   Continuous Infusions: . sodium chloride 100 mL/hr (06/17/15 1021)    Principal Problem:   Acute respiratory failure with hypoxia Active Problems:   Cancer of hypopharynx   Anemia due to antineoplastic chemotherapy    Time spent: 40 minutes.    Vernell Leep, MD, FACP, FHM. [facility] Pager [phone]  If 7PM-7AM, please contact night-coverage [email or web address] Password [email or web address] 06/17/2015, 10:54 AM    LOS: 1 day

## 2015-06-18 ENCOUNTER — Inpatient Hospital Stay (HOSPITAL_COMMUNITY): Payer: Medicare Other

## 2015-06-18 DIAGNOSIS — T451X5A Adverse effect of antineoplastic and immunosuppressive drugs, initial encounter: Secondary | ICD-10-CM

## 2015-06-18 DIAGNOSIS — C7801 Secondary malignant neoplasm of right lung: Secondary | ICD-10-CM | POA: Insufficient documentation

## 2015-06-18 DIAGNOSIS — E46 Unspecified protein-calorie malnutrition: Secondary | ICD-10-CM

## 2015-06-18 DIAGNOSIS — J189 Pneumonia, unspecified organism: Principal | ICD-10-CM

## 2015-06-18 LAB — BLOOD GAS, ARTERIAL
ACID-BASE DEFICIT: 0.7 mmol/L (ref 0.0–2.0)
Acid-base deficit: 2.9 mmol/L — ABNORMAL HIGH (ref 0.0–2.0)
Acid-base deficit: 1.8 mmol/L (ref 0.0–2.0)
Acid-base deficit: 1.5 mmol/L (ref 0.0–2.0)
Acid-base deficit: 0.8 mmol/L (ref 0.0–2.0)
BICARBONATE: 24.7 meq/L — AB (ref 20.0–24.0)
Bicarbonate: 24.3 mEq/L — ABNORMAL HIGH (ref 20.0–24.0)
Bicarbonate: 23.8 mEq/L (ref 20.0–24.0)
Bicarbonate: 24.6 mEq/L — ABNORMAL HIGH (ref 20.0–24.0)
Bicarbonate: 24.4 mEq/L — ABNORMAL HIGH (ref 20.0–24.0)
DELIVERY SYSTEMS: POSITIVE
DRAWN BY: 257701
DRAWN BY: 257701
Drawn by: 257701
Drawn by: 257701
Drawn by: 257701
Expiratory PAP: 5
FIO2: 1
FIO2: 1
FIO2: 0.5
FIO2: 0.4
FIO2: 0.5
Inspiratory PAP: 10
LHR: 30 {breaths}/min
LHR: 30 {breaths}/min
MECHVT: 380 mL
MECHVT: 340 mL
O2 SAT: 79 %
O2 Saturation: 96.5 %
O2 Saturation: 99.4 %
O2 Saturation: 93.8 %
O2 Saturation: 96.2 %
PATIENT TEMPERATURE: 98.6
PATIENT TEMPERATURE: 98.6
PATIENT TEMPERATURE: 98.6
PEEP/CPAP: 5 cmH2O
PEEP: 5 cmH2O
PEEP: 5 cmH2O
PEEP: 10 cmH2O
PH ART: 7.317 — AB (ref 7.350–7.450)
PH ART: 7.339 — AB (ref 7.350–7.450)
PH ART: 7.307 — AB (ref 7.350–7.450)
PH ART: 7.334 — AB (ref 7.350–7.450)
PO2 ART: 99.2 mmHg (ref 80.0–100.0)
Patient temperature: 98.6
Patient temperature: 98.6
RATE: 8 resp/min
RATE: 26 resp/min
RATE: 34 resp/min
TCO2: 23.4 mmol/L (ref 0–100)
TCO2: 22.8 mmol/L (ref 0–100)
TCO2: 23.7 mmol/L (ref 0–100)
TCO2: 23.5 mmol/L (ref 0–100)
TCO2: 23.3 mmol/L (ref 0–100)
VT: 340 mL
VT: 290 mL
pCO2 arterial: 58.2 mmHg (ref 35.0–45.0)
pCO2 arterial: 47.9 mmHg — ABNORMAL HIGH (ref 35.0–45.0)
pCO2 arterial: 46.9 mmHg — ABNORMAL HIGH (ref 35.0–45.0)
pCO2 arterial: 50.2 mmHg — ABNORMAL HIGH (ref 35.0–45.0)
pCO2 arterial: 47.8 mmHg — ABNORMAL HIGH (ref 35.0–45.0)
pH, Arterial: 7.245 — ABNORMAL LOW (ref 7.350–7.450)
pO2, Arterial: 120 mmHg — ABNORMAL HIGH (ref 80.0–100.0)
pO2, Arterial: 279 mmHg — ABNORMAL HIGH (ref 80.0–100.0)
pO2, Arterial: 80 mmHg (ref 80.0–100.0)
pO2, Arterial: 53.2 mmHg — ABNORMAL LOW (ref 80.0–100.0)

## 2015-06-18 LAB — RESPIRATORY VIRUS PANEL
Adenovirus: NEGATIVE
Influenza A: NEGATIVE
Influenza B: NEGATIVE
METAPNEUMOVIRUS: NEGATIVE
PARAINFLUENZA 2 A: NEGATIVE
Parainfluenza 1: NEGATIVE
Parainfluenza 3: NEGATIVE
RESPIRATORY SYNCYTIAL VIRUS B: NEGATIVE
Respiratory Syncytial Virus A: NEGATIVE
Rhinovirus: NEGATIVE

## 2015-06-18 LAB — GLUCOSE, CAPILLARY
GLUCOSE-CAPILLARY: 237 mg/dL — AB (ref 65–99)
Glucose-Capillary: 194 mg/dL — ABNORMAL HIGH (ref 65–99)
Glucose-Capillary: 269 mg/dL — ABNORMAL HIGH (ref 65–99)

## 2015-06-18 LAB — CBC WITH DIFFERENTIAL/PLATELET
BASOS ABS: 0 10*3/uL (ref 0.0–0.1)
BASOS PCT: 0 % (ref 0–1)
Eosinophils Absolute: 0 10*3/uL (ref 0.0–0.7)
Eosinophils Relative: 0 % (ref 0–5)
HCT: 29.8 % — ABNORMAL LOW (ref 36.0–46.0)
HEMOGLOBIN: 9.4 g/dL — AB (ref 12.0–15.0)
Lymphocytes Relative: 4 % — ABNORMAL LOW (ref 12–46)
Lymphs Abs: 0.5 10*3/uL — ABNORMAL LOW (ref 0.7–4.0)
MCH: 27.6 pg (ref 26.0–34.0)
MCHC: 31.5 g/dL (ref 30.0–36.0)
MCV: 87.6 fL (ref 78.0–100.0)
Monocytes Absolute: 0.3 10*3/uL (ref 0.1–1.0)
Monocytes Relative: 2 % — ABNORMAL LOW (ref 3–12)
Neutro Abs: 10.5 10*3/uL — ABNORMAL HIGH (ref 1.7–7.7)
Neutrophils Relative %: 94 % — ABNORMAL HIGH (ref 43–77)
Platelets: 231 10*3/uL (ref 150–400)
RBC: 3.4 MIL/uL — ABNORMAL LOW (ref 3.87–5.11)
RDW: 18.1 % — ABNORMAL HIGH (ref 11.5–15.5)
WBC: 11.2 10*3/uL — AB (ref 4.0–10.5)

## 2015-06-18 LAB — BASIC METABOLIC PANEL
Anion gap: 12 (ref 5–15)
BUN: 9 mg/dL (ref 6–20)
CHLORIDE: 103 mmol/L (ref 101–111)
CO2: 24 mmol/L (ref 22–32)
CREATININE: 0.5 mg/dL (ref 0.44–1.00)
Calcium: 9.3 mg/dL (ref 8.9–10.3)
GFR calc Af Amer: >60 mL/min (ref >60)
GFR calc non Af Amer: >60 mL/min (ref >60)
Glucose, Bld: 174 mg/dL — ABNORMAL HIGH (ref 65–99)
POTASSIUM: 4.5 mmol/L (ref 3.5–5.1)
Sodium: 139 mmol/L (ref 135–145)

## 2015-06-18 LAB — URINE CULTURE: Culture: NO GROWTH

## 2015-06-18 LAB — LEGIONELLA ANTIGEN, URINE

## 2015-06-18 LAB — TRIGLYCERIDES: TRIGLYCERIDES: 66 mg/dL (ref <150)

## 2015-06-18 MED ORDER — LORAZEPAM 2 MG/ML IJ SOLN
0.5000 mg | Freq: Once | INTRAMUSCULAR | Status: AC
  Administered 2015-06-18: 0.5 mg via INTRAVENOUS
  Filled 2015-06-18: qty 1

## 2015-06-18 MED ORDER — PANTOPRAZOLE SODIUM 40 MG PO PACK
40.0000 mg | PACK | Freq: Every day | ORAL | Status: DC
  Administered 2015-06-18 – 2015-06-24 (×7): 40 mg
  Filled 2015-06-18 (×11): qty 20

## 2015-06-18 MED ORDER — FENTANYL BOLUS VIA INFUSION
25.0000 ug | INTRAVENOUS | Status: DC | PRN
  Administered 2015-06-18 – 2015-06-24 (×3): 25 ug via INTRAVENOUS
  Filled 2015-06-18: qty 25

## 2015-06-18 MED ORDER — PROPOFOL 1000 MG/100ML IV EMUL
0.0000 ug/kg/min | INTRAVENOUS | Status: DC
  Administered 2015-06-18: 5 ug/kg/min via INTRAVENOUS
  Administered 2015-06-19: 30 ug/kg/min via INTRAVENOUS
  Administered 2015-06-19: 20 ug/kg/min via INTRAVENOUS
  Administered 2015-06-20: 35 ug/kg/min via INTRAVENOUS
  Administered 2015-06-20: 20 ug/kg/min via INTRAVENOUS
  Administered 2015-06-20: 35 ug/kg/min via INTRAVENOUS
  Administered 2015-06-21: 25 ug/kg/min via INTRAVENOUS
  Administered 2015-06-21: 30 ug/kg/min via INTRAVENOUS
  Administered 2015-06-21: 22 ug/kg/min via INTRAVENOUS
  Administered 2015-06-21: 25 ug/kg/min via INTRAVENOUS
  Administered 2015-06-22: 10 ug/kg/min via INTRAVENOUS
  Administered 2015-06-22: 25 ug/kg/min via INTRAVENOUS
  Filled 2015-06-18 (×9): qty 100

## 2015-06-18 MED ORDER — INSULIN ASPART 100 UNIT/ML ~~LOC~~ SOLN
0.0000 [IU] | SUBCUTANEOUS | Status: DC
  Administered 2015-06-18 – 2015-06-20 (×8): 2 [IU] via SUBCUTANEOUS
  Administered 2015-06-20 (×2): 1 [IU] via SUBCUTANEOUS
  Administered 2015-06-21: 2 [IU] via SUBCUTANEOUS
  Administered 2015-06-21 – 2015-06-22 (×4): 1 [IU] via SUBCUTANEOUS
  Administered 2015-06-22: 2 [IU] via SUBCUTANEOUS
  Administered 2015-06-22 – 2015-06-23 (×3): 1 [IU] via SUBCUTANEOUS
  Administered 2015-06-23: 2 [IU] via SUBCUTANEOUS
  Administered 2015-06-23: 1 [IU] via SUBCUTANEOUS
  Administered 2015-06-23 – 2015-06-25 (×8): 2 [IU] via SUBCUTANEOUS
  Administered 2015-06-25: 3 [IU] via SUBCUTANEOUS

## 2015-06-18 MED ORDER — DEXTROSE 5 % IV SOLN
500.0000 mg | INTRAVENOUS | Status: DC
  Administered 2015-06-18 – 2015-06-23 (×6): 500 mg via INTRAVENOUS
  Filled 2015-06-18 (×7): qty 500

## 2015-06-18 MED ORDER — PHENYLEPHRINE HCL 10 MG/ML IJ SOLN
0.0000 ug/min | INTRAMUSCULAR | Status: DC
  Administered 2015-06-18: 70 ug/min via INTRAVENOUS
  Administered 2015-06-19: 65 ug/min via INTRAVENOUS
  Administered 2015-06-19: 90 ug/min via INTRAVENOUS
  Administered 2015-06-20: 35 ug/min via INTRAVENOUS
  Administered 2015-06-20: 175 ug/min via INTRAVENOUS
  Administered 2015-06-21: 30 ug/min via INTRAVENOUS
  Administered 2015-06-22: 20 ug/min via INTRAVENOUS
  Administered 2015-06-23: 40 ug/min via INTRAVENOUS
  Administered 2015-06-24: 25 ug/min via INTRAVENOUS
  Filled 2015-06-18 (×9): qty 4

## 2015-06-18 MED ORDER — PRAVASTATIN SODIUM 20 MG PO TABS
40.0000 mg | ORAL_TABLET | Freq: Every day | ORAL | Status: DC
  Administered 2015-06-18 – 2015-06-24 (×7): 40 mg
  Filled 2015-06-18 (×6): qty 2

## 2015-06-18 MED ORDER — MORPHINE SULFATE 2 MG/ML IJ SOLN
2.0000 mg | INTRAMUSCULAR | Status: DC | PRN
Start: 2015-06-18 — End: 2015-06-18
  Administered 2015-06-18 (×2): 2 mg via INTRAVENOUS
  Filled 2015-06-18: qty 1

## 2015-06-18 MED ORDER — ETOMIDATE 2 MG/ML IV SOLN
INTRAVENOUS | Status: AC
  Administered 2015-06-18: 20 mg
  Filled 2015-06-18: qty 20

## 2015-06-18 MED ORDER — VITAL AF 1.2 CAL PO LIQD
1000.0000 mL | ORAL | Status: DC
  Administered 2015-06-18 – 2015-06-19 (×2): 1000 mL
  Filled 2015-06-18 (×3): qty 1000

## 2015-06-18 MED ORDER — CETYLPYRIDINIUM CHLORIDE 0.05 % MT LIQD
7.0000 mL | Freq: Four times a day (QID) | OROMUCOSAL | Status: DC
  Administered 2015-06-18 – 2015-06-25 (×29): 7 mL via OROMUCOSAL

## 2015-06-18 MED ORDER — ROCURONIUM BROMIDE 50 MG/5ML IV SOLN
INTRAVENOUS | Status: AC
  Filled 2015-06-18: qty 2

## 2015-06-18 MED ORDER — CHLORHEXIDINE GLUCONATE 0.12 % MT SOLN
15.0000 mL | Freq: Two times a day (BID) | OROMUCOSAL | Status: DC
  Administered 2015-06-18 – 2015-06-25 (×15): 15 mL via OROMUCOSAL
  Filled 2015-06-18 (×12): qty 15

## 2015-06-18 MED ORDER — SUCCINYLCHOLINE CHLORIDE 20 MG/ML IJ SOLN
INTRAMUSCULAR | Status: AC
  Filled 2015-06-18: qty 1

## 2015-06-18 MED ORDER — LIDOCAINE HCL (CARDIAC) 20 MG/ML IV SOLN
INTRAVENOUS | Status: AC
Start: 2015-06-18 — End: 2015-06-18
  Filled 2015-06-18: qty 5

## 2015-06-18 MED ORDER — MORPHINE SULFATE 2 MG/ML IJ SOLN
INTRAMUSCULAR | Status: AC
  Filled 2015-06-18: qty 1

## 2015-06-18 MED ORDER — FENTANYL CITRATE (PF) 100 MCG/2ML IJ SOLN
INTRAMUSCULAR | Status: AC
  Administered 2015-06-18: 200 ug
  Filled 2015-06-18: qty 8

## 2015-06-18 MED ORDER — SODIUM CHLORIDE 0.9 % IV SOLN
25.0000 ug/h | INTRAVENOUS | Status: DC
  Administered 2015-06-18: 75 ug/h via INTRAVENOUS
  Administered 2015-06-19 (×3): 50 ug/h via INTRAVENOUS
  Administered 2015-06-19: 75 ug/h via INTRAVENOUS
  Administered 2015-06-19 (×3): 50 ug/h via INTRAVENOUS
  Administered 2015-06-20: 400 ug/h via INTRAVENOUS
  Administered 2015-06-20: 300 ug/h via INTRAVENOUS
  Administered 2015-06-20: 175 ug/h via INTRAVENOUS
  Administered 2015-06-21 (×2): 400 ug/h via INTRAVENOUS
  Administered 2015-06-21: 275 ug/h via INTRAVENOUS
  Administered 2015-06-22 (×3): 400 ug/h via INTRAVENOUS
  Administered 2015-06-22: 300 ug/h via INTRAVENOUS
  Administered 2015-06-22: 375 ug/h via INTRAVENOUS
  Administered 2015-06-23 – 2015-06-25 (×6): 400 ug/h via INTRAVENOUS
  Administered 2015-06-25: 25 ug/h via INTRAVENOUS
  Administered 2015-06-25: 400 ug/h via INTRAVENOUS
  Filled 2015-06-18 (×21): qty 50

## 2015-06-18 MED ORDER — MIDAZOLAM HCL 2 MG/2ML IJ SOLN
INTRAMUSCULAR | Status: AC
  Administered 2015-06-18: 2 mg
  Filled 2015-06-18: qty 4

## 2015-06-18 MED ORDER — FENTANYL CITRATE (PF) 100 MCG/2ML IJ SOLN
INTRAMUSCULAR | Status: AC
  Filled 2015-06-18: qty 2

## 2015-06-18 MED ORDER — BISACODYL 10 MG RE SUPP
10.0000 mg | Freq: Once | RECTAL | Status: DC
  Filled 2015-06-18: qty 1

## 2015-06-18 MED ORDER — FENTANYL CITRATE (PF) 100 MCG/2ML IJ SOLN
50.0000 ug | Freq: Once | INTRAMUSCULAR | Status: AC
  Administered 2015-06-18: 50 ug via INTRAVENOUS

## 2015-06-18 MED ORDER — PHENYLEPHRINE HCL 10 MG/ML IJ SOLN
0.0000 ug/min | INTRAMUSCULAR | Status: DC
  Administered 2015-06-18: 20 ug/min via INTRAVENOUS
  Administered 2015-06-18 (×2): 100 ug/min via INTRAVENOUS
  Administered 2015-06-18: 110 ug/min via INTRAVENOUS
  Administered 2015-06-18: 120 ug/min via INTRAVENOUS
  Filled 2015-06-18 (×4): qty 1

## 2015-06-18 MED ORDER — VITAL HIGH PROTEIN PO LIQD
1000.0000 mL | ORAL | Status: DC
  Filled 2015-06-18: qty 1000

## 2015-06-18 NOTE — Progress Notes (Signed)
CXR and ABG followed up, findings worrisome for ARDS. I discussed case with Dr Elsworth Soho of PCCM who will likely transfer her to ICU and undergo intubation. Her care will be transferred to the [facility] service.

## 2015-06-18 NOTE — Procedures (Signed)
Intubation Procedure Note Sherry Craig [id number] 1946/07/21  Procedure: Intubation Indications: Respiratory insufficiency  Procedure Details Consent: Risks of procedure as well as the alternatives and risks of each were explained to the (patient/caregiver).  Consent for procedure obtained. Time Out: Verified patient identification, verified procedure, site/side was marked, verified correct patient position, special equipment/implants available, medications/allergies/relevent history reviewed, required imaging and test results available.  Performed  Maximum sterile technique was used including antiseptics, cap, gloves, gown, hand hygiene and mask.  MAC  3 glide scope # 7.5 ett  Evaluation Hemodynamic Status: BP stable throughout; O2 sats: stable throughout Patient's Current Condition: stable Complications: No apparent complications Patient did tolerate procedure well. Chest X-ray ordered to verify placement.  CXR: pending.   Sherry Craig,PETE 06/18/2015

## 2015-06-18 NOTE — Progress Notes (Addendum)
Spiritual care consulted by nursing for family support.   Chaplain provided support with pt's spouse, son and daughter during pt's intubation.  Mrs. Baltazar Najjar and spouse, Orpah Greek, have been together for 13 years.  He has appropriate feeling of shock - asking "how is this happening" "It is not supposed to be like this" "How do people get through this."  He is anticipating life without her and is fearful / grieving in anticipation of this.  Chaplain provided support around anticipatory grief and engaged in life review to provide Orpah Greek context for caring for Mrs Baltazar Najjar in this place.    Extended family gathering in ICU conference room.  Chaplain introduced spiritual care as resources and provided brief support with extended family.      Jerene Pitch [place]

## 2015-06-18 NOTE — Procedures (Signed)
Bronchoscopy Procedure Note Sherry Craig [id number] 06-10-1946  Procedure: Bronchoscopy Indications: Diagnostic evaluation of the airways and Obtain specimens for culture and/or other diagnostic studies  Procedure Details Consent: Risks of procedure as well as the alternatives and risks of each were explained to the (patient/caregiver).  Consent for procedure obtained. Time Out: Verified patient identification, verified procedure, site/side was marked, verified correct patient position, special equipment/implants available, medications/allergies/relevent history reviewed, required imaging and test results available.  Performed  In preparation for procedure, patient was given 100% FiO2 and bronchoscope lubricated. Sedation: fentanyl gtt  Airway entered and the following bronchi were examined: Bronchi.  Mild secretions noted in proximal airways Procedures performed: BAL performed on left Bronchoscope removed.  , Patient placed back on 100% FiO2 at conclusion of procedure.    Evaluation Hemodynamic Status: BP stable throughout; O2 sats: stable throughout Patient's Current Condition: stable Specimens:  Sent serosanguinous fluid for cytology, culture Complications: No apparent complications Patient did tolerate procedure well.   Rigoberto Noel MD 06/18/2015

## 2015-06-18 NOTE — Progress Notes (Signed)
Sherry Craig   DOB:06-02-1946   ST#:[id number]   LGX#:[id number]  Patient Care Team: Lester Kinsman, PA-C as PCP - General (Physician Assistant) Thea Silversmith, MD (Radiation Oncology) Jerrell Belfast, MD (Otolaryngology) Heath Lark, MD as Consulting Physician (Hematology and Oncology) Leota Sauers, RN as Oncology Nurse Navigator (Oncology) Karie Mainland, RD as Dietitian (Nutrition)  Subjective: patient seen and examined. She has developed acute, worsening dyspnea requiring subsequent intubation. She is currently intubated and sedated. Other history cannot be obtained at this time due to current status.  Scheduled Meds: . antiseptic oral rinse  7 mL Mouth Rinse BID  . bisacodyl  10 mg Rectal Once  . cholecalciferol  2,000 Units Oral Daily  . enoxaparin (LOVENOX) injection  40 mg Subcutaneous Q24H  . feeding supplement (ENSURE ENLIVE)  237 mL Oral BID BM  . LORazepam  0.5 mg Intravenous Once  . methylPREDNISolone (SOLU-MEDROL) injection  60 mg Intravenous 3 times per day  . pantoprazole  40 mg Oral Daily  . piperacillin-tazobactam (ZOSYN)  IV  3.375 g Intravenous Q8H  . pravastatin  40 mg Oral Daily  . vancomycin  500 mg Intravenous Q12H   Continuous Infusions:  PRN Meds:.acetaminophen **OR** acetaminophen, docusate sodium, morphine injection, ondansetron **OR** ondansetron (ZOFRAN) IV, ondansetron, oxyCODONE  Objective:  Filed Vitals:   06/18/15 0600  BP: 137/90  Pulse: 105  Temp:   Resp: 36      Intake/Output Summary (Last 24 hours) at 06/18/15 0736 Last data filed at 06/18/15 0600  Gross per 24 hour  Intake   1017 ml  Output   1452 ml  Net   -435 ml     GENERAL: Intubated and sedated  SKIN: skin color, texture, turgor are normal, no rashes or significant lesions EYES: Closed OROPHARYNX ET tube in situ. NECK: supple, thyroid normal size, non-tender, without nodularity LYMPH:  no palpable lymphadenopathy in the cervical, axillary or  inguinal LUNGS:bilateral rales, rhonchi. Decreased breath sounds on the right. HEART: regular rate & rhythm and no murmurs and no lower extremity edema ABDOMEN: soft, non-tender and normal bowel sounds   CBG (last 3)  No results for input(s): GLUCAP in the last 72 hours.   Labs:   Recent Labs Lab 06/12/15 0817 07/01/2015 2056 06/17/15 0235 06/18/15 0336  WBC 4.7 7.2 6.8 11.2*  HGB 9.5* 8.7* 8.8* 9.4*  HCT 29.2* 27.2* 27.2* 29.8*  PLT 308 254 216 231  MCV 87.9 86.9 86.6 87.6  MCH 28.5 27.8 28.0 27.6  MCHC 32.4 32.0 32.4 31.5  RDW 18.1* 18.3* 18.2* 18.1*  LYMPHSABS 0.7* 0.9 0.8 0.5*  MONOABS 0.6 0.3 0.3 0.3  EOSABS 0.1 0.1 0.0 0.0  BASOSABS 0.1 0.0 0.0 0.0     Chemistries:    Recent Labs Lab 06/12/15 0817 06/17/2015 2056 06/17/15 0235 06/18/15 0336  NA 140 133* 137 139  K 4.1 4.1 3.9 4.5  CL  --  100* 105 103  CO2 '27 24 24 24  '$ GLUCOSE 85 110* 111* 174*  BUN 12.'3 10 8 9  '$ CREATININE 0.8 0.60 0.61 0.50  CALCIUM 10.1 9.1 8.6* 9.3  AST '15 22 19  '$ --   ALT 6 12* 11*  --   ALKPHOS 94 76 75  --   BILITOT 0.25 0.6 0.6  --     GFR Estimated Creatinine Clearance: 48.5 mL/min (by C-G formula based on Cr of 0.5).  Liver Function Tests:  Recent Labs Lab 06/12/15 0817 07/11/2015 2056 06/17/15 0235  AST 15 22  19  ALT 6 12* 11*  ALKPHOS 94 76 75  BILITOT 0.25 0.6 0.6  PROT 7.2 7.4 6.6  ALBUMIN 3.2* 3.4* 3.0*     Cardiac Enzymes:  Recent Labs Lab 06/17/15 0655  TROPONINI <0.03    Thyroid function studies  Recent Labs  06/17/15 0237  TSH 1.743   Microbiology Negative to date   Imaging Studies:  Dg Chest 2 View  07/15/2015   CLINICAL DATA:  Initial encounter for shortness of breath with productive cough and right rib pain  EXAM: CHEST  2 VIEW  COMPARISON:  07/09/2015.  FINDINGS: Moderate right pleural effusion again noted with right base collapse/ consolidation persisting. There is pulmonary vascular congestion without overt pulmonary edema. Now with  some parahilar airspace disease on the left. Cardiopericardial silhouette is at upper limits of normal for size. Left Port-A-Cath again noted with tip overlying the mid SVC. Telemetry leads overlie the chest. Imaged bony structures of the thorax are intact.  IMPRESSION: Stable small to moderate right pleural effusion with right base collapse/ consolidation.  Interval development of vascular congestion left parahilar airspace disease on the current study. Underlying component of pulmonary edema could have this appearance.   Electronically Signed   By: Misty Stanley M.D.   On: 07/14/2015 20:57   Ct Angio Chest Pe W/cm &/or Wo Cm  07/11/2015   CLINICAL DATA:  Subsequent encounter for feeling shortness of breath since yesterday with weakness. 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  EXAM: CT ANGIOGRAPHY CHEST WITH CONTRAST  TECHNIQUE: Multidetector CT imaging of the chest was performed using the standard protocol during bolus administration of intravenous contrast. Multiplanar CT image reconstructions and MIPs were obtained to evaluate the vascular anatomy.  CONTRAST:  158m OMNIPAQUE IOHEXOL 350 MG/ML SOLN  COMPARISON:  PET-CT from 05/02/2015.  FINDINGS: Mediastinum / Lymph Nodes: There is no filling defect within the opacified pulmonary arteries to suggest the presence of an acute pulmonary embolus. No evidence for dissection of the thoracic aorta.  No axillary lymphadenopathy. 17 mm short axis precarinal lymph node is not substantially changed in the interval. 16 mm short axis subcarinal lymph node is again noted. There is a 16 mm short axis lymph node in the right hilum. Heart is mildly enlarged. No pericardial effusion.  Lungs / Pleura: Lung windows show areas of interstitial lung ground-glass attenuation in the lungs bilaterally, left much greater than right. Volume loss in the right hemi thorax is stable. The fluid seen previously in the right major fissure has decreased. Left  infrahilar nodule measures 2.1 cm today compared to 2.2 cm previously. There is right lower lobe collapse/consolidation. Right pleural effusion is not substantially changed in the interval.  MSK / Soft Tissues: Bone windows reveal no worrisome lytic or sclerotic osseous lesions.  Upper Abdomen: Heterogeneous perfusion noted in the liver, nonspecific. Small cyst in the posterior left kidney measures 11 mm.  Review of the MIP images confirms the above findings.  IMPRESSION: 1. No CT evidence for acute pulmonary embolus. 2. Right lower lobe collapse/consolidation with right pleural thickening and right pleural effusion. Overall imaging features are not substantially changed since the prior PET-CT. 3. Mediastinal and right hilar lymphadenopathy with left lower lobe pulmonary nodule, consistent with metastatic disease. 4. Interval development of interstitial and alveolar ground-glass attenuation, mainly in the left lung. Imaging features are nonspecific and differential considerations include pulmonary hemorrhage, bacterial or atypical infection, radiation pneumonitis, or lymphangitic tumor spread.   Electronically Signed  By: Misty Stanley M.D.   On: 07/05/2015 23:01    Assessment/Plan: 69 y.o.  Cancer of hypopharynx She is s/p Cycle 4 chemotherapy with Carboplatin and Taxol D1 C4 on 06/12/15 She tolerated treatment well without any side effects. I compared her imaging study between the PET CT scan prior to treatment and CT scan of the chest dated yesterday Overall, she is improving with treatment  Continue supportive care for now   Acute respiratory failure with hypoxia.  She reported increasing shortness of breath with minimal exertion and non productive cough since her last chemo. Chest X ray on 8/2 showed Stable small to moderate right pleural effusion with right base collapse/ consolidation, with possible pulmonary edema.  CT angio chest on 8/1 was negative for pulmonary embolism, similar chest  x ray findings, suspicious for pneumonia. She was transferred to to the SDU, and began management by CCM/ Pulmonary, with IV antibiotics and supportive therapy. Cultures were obtained, results pending. Appreciate admitting team involvement. Due to the fact that she appeared to be responding to treatment on recent imaging study, aggressive care is recommended Infiltrate on the left lung is highly suspicious for infection. Less likely would be side effects related to chemotherapy. Respiratory status worsened this morning, currently status post intubation Continue high dose steroids dose  Anemia due to antineoplastic chemotherapy This is likely due to recent treatment. The patient denies recent history of bleeding such as epistaxis, hematuria or hematochezia.  She is asymptomatic from the anemia.Will observe for now. She does not require transfusion now.   Metastasis to bone  Protein calorie malnutrition  DVT prophylaxis On Lovenox  Full Code  Other medical issues as per admitting team  Will follow   Rondel Jumbo, PA-C 06/18/2015  7:36 AM Gianni Mihalik, MD 06/18/2015

## 2015-06-18 NOTE — Progress Notes (Signed)
Nutrition Follow-up  DOCUMENTATION CODES:   Non-severe (moderate) malnutrition in context of chronic illness  INTERVENTION:  - Will order Vital AF 1.2 @ 40 mL/hr which will provide 1152 kcal (1236 kcal with Propofol), 72 grams protein, and 778 mL free water. Free water flush per MD. - RD will continue to monitor for needs  NUTRITION DIAGNOSIS:   Malnutrition related to chronic illness as evidenced by percent weight loss, moderate depletion of body fat, moderate depletions of muscle mass. -ongoing  GOAL:   Patient will meet greater than or equal to 90% of their needs -ongoing, unmet  MONITOR:   Labs, Weight trends, Skin, I & O's, TF tolerance  REASON FOR ASSESSMENT:   Consult Enteral/tube feeding initiation and management  ASSESSMENT:   32 yof w/ h/o recurrent head/neck cancer w/ known mets to bone, lung and right pleural space. Currently s/p s/p cycle 4 of carboplatin and taxol (last on 7/28). Admitted on 8/2 w 3-4d h/o progressive dyspnea.   8/3 New consult for RD to initiate and manage TF.   Patient is currently intubated on ventilator support MV: 10.1 L/min Temp (24hrs), Avg:97.8 F (36.6 C), Min:96.7 F (35.9 C), Max:98.8 F (37.1 C)  Propofol: 3.2 ml/hr (84 kcal)  Pt was transferred to ICU this AM and intubated at 0950. OGT is currently in place and TF not yet initiated; although Vital High Protein @ 40 mL/hr ordered per TF protocol.   RD will order TF regimen as outlined above. Not meeting needs which have been re-estimated based on intubation. Medications reviewed. Labs reviewed.  Drips: Fentanyl @ 75 mcg/hr, Propofol @ 3.2 mL/hr, Neosynephrine @ 50 mcg/hr.   8/2 - Last seen by [place] RD 05/19/15 - Ate 50% breakfast and RD visualized 75% completion of lunch - Pt drinking Ensure PTA and here - Per weight history documentation, pt has lost 19 lb since 1/28 (14% weight loss x 6 months, significant for time frame).  Diet Order:  Diet Heart Room service  appropriate?: Yes; Fluid consistency:: Thin  Skin:  Reviewed, no issues  Last BM:  PTA  Height:   Ht Readings from Last 1 Encounters:  06/18/15 '5\' 1"'$  (1.549 m)    Weight:   Wt Readings from Last 1 Encounters:  06/17/15 115 lb 11.9 oz (52.5 kg)    Ideal Body Weight:  45 kg  BMI:  Body mass index is 21.88 kg/(m^2).  Estimated Nutritional Needs:   Kcal:  1263  Protein:  63-79 grams  Fluid:  1.7L/day  EDUCATION NEEDS:   No education needs identified at this time    Jarome Matin, RD, LDN Inpatient Clinical Dietitian Pager # [phone] After hours/weekend pager # [phone]

## 2015-06-18 NOTE — Care Management Important Message (Signed)
Important Message  Patient Details IM letter give to Maudie Mercury to present to patientImportant Message  Patient Details  Name: Sherry Craig MRN: [id number] Date of Birth: Aug 24, 1946   Medicare Important Message Given:  Yes-second notification given    Camillo Flaming 06/18/2015, 12:44 PM Name: Sherry Craig MRN: [id number] Date of Birth: 1946-02-22   Medicare Important Message Given:  Yes-second notification given    Camillo Flaming 06/18/2015, 12:44 PM

## 2015-06-18 NOTE — Procedures (Signed)
Intubation Procedure Note Sherry Craig [id number] 21-May-1946  Procedure: Intubation Indications: Respiratory insufficiency  Procedure Details Consent: Risks of procedure as well as the alternatives and risks of each were explained to the (patient/caregiver).  Consent for procedure obtained. Time Out: Verified patient identification, verified procedure, site/side was marked, verified correct patient position, special equipment/implants available, medications/allergies/relevent history reviewed, required imaging and test results available.  Performed  Maximum sterile technique was used including cap, gloves, gown, hand hygiene and mask.  MAC and 3    Evaluation Hemodynamic Status: BP stable throughout; O2 sats: stable throughout Patient's Current Condition: stable Complications: No apparent complications Patient did tolerate procedure well. Chest X-ray ordered to verify placement.  CXR: pending.   Sherry Craig 06/18/2015  PT intubated by Marni Griffon, NP

## 2015-06-18 NOTE — Progress Notes (Signed)
[facility] Progress Note Patient Name: Sherry Craig DOB: 1946-06-16 MRN: [id number]   Date of Service  06/18/2015  HPI/Events of Note  hyperglycemia  eICU Interventions  ssi     Intervention Category Intermediate Interventions: Hyperglycemia - evaluation and treatment  MCQUAID, DOUGLAS 06/18/2015, 7:24 PM

## 2015-06-18 NOTE — Progress Notes (Signed)
RT was able to start PT on 8cc, to 7cc, to [place]. At 6cc 290, RR 34, 40%, 5- PT appeared air hungry and was difficult to oxygenate. RT completed ABG (results in EPIC). Based on ABG at 6cc and PT performance- RT placed PT back on 7cc- RN aware.

## 2015-06-18 NOTE — Progress Notes (Signed)
$'10mg'p$  Oxycodone wasted in [place] with Cay Schillings, RN

## 2015-06-18 NOTE — Progress Notes (Signed)
PROGRESS NOTE    Sherry Craig YSA:[id number] DOB: October 14, 1946 DOA: 07/06/2015 PCP: Lester Kinsman, PA-C  Primary Oncologist: Dr. 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 In the ED, patient was initially tachycardic in the 130s and oxygen saturation of 86% on room air. CTA chest: Negative for PE, showed RLL collapse/consolidation with pleural thickening and right pleural effusion, interval development of interstitial and alveolar groundglass attenuation mainly in the left lung with broad differential diagnosis including pulmonary hemorrhage, infection, radiation pneumonitis or lymphangitic spread of tumor. Currently treating for healthcare associated pneumonia. CCM consulted for right thoracentesis.   Assessment/Plan:  Acute hypoxemic respiratory failure  -Evidenced by patient's going into respiratory distress, currently having a respiratory of 36, requiring BiPAP, appears uncomfortable.  -Respiratory failure possibility secondary to worsening right sided pleural effusion contributing. Other possibilities/contributing factors include healthcare associated pneumonia versus filed pneumonia versus lymphangitic tumor spread versus radiation pneumonitis.  -Will obtain a stat chest x-ray and ABG -Case discussed with pulmonary critical care medicine as I am worried she may require intubation if she continues to do poorly.  Possible healthcare associated pneumonia - Based on symptoms and CTA chest findings. Other DD-all her findings may be related to lymphangitic spread of cancer. Less likely to be pulmonary hemorrhage. - Treating empirically with IV  vancomycin and Zosyn - Urine streptococcal antigen: Negative. - Blood cultures 2, urine Legionella antigen: Pending -Respiratory viral panel pending -She was started on a trial of steroids therapy for possible pneumonitis -Despite empiric IV antibiotic therapy and steroids her respiratory status acutely worsening overnight, checking stat ABG and chest x-ray  Right pleural effusion - Per CT report, not significantly changed compared to PET scan on 05/02/15 -Given acute degeneration and respiratory status have ordered a repeat stat chest x-ray this morning. It is possible pleural effusion may be contributing to acute respiratory failure.  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  HLD - Continue statins  GERD - PPI   DVT prophylaxis: Lovenox Code Status: Full Family Communication: I spoke with her husband was present at bedside Disposition Plan:    Consultants:  PCCM  Procedures:  None  Antibiotics:  IV Zosyn 8/1 >  IV vancomycin 8/1 >  Subjective: Patient having deterioration in respiratory status overnight requiring BiPAP. During my evaluation she appears to be uncomfortable on BiPAP.   Objective: Filed Vitals:   06/18/15 0342 06/18/15 0400 06/18/15 0500 06/18/15 0600  BP:  130/87  137/90  Pulse: 123 115 92 105  Temp:  96.7 F (35.9 C)    TempSrc:  Axillary    Resp: '22 29 30 '$ 36  Height:      Weight:      SpO2: 93% 97% 99% 100%    Intake/Output Summary (Last 24 hours) at 06/18/15 0735 Last data filed at 06/18/15 0600  Gross per 24 hour  Intake   1017 ml  Output   1452 ml  Net   -435 ml   Filed Weights   06/17/15 0036  Weight: 52.5 kg (115 lb 11.9 oz)     Exam:  General exam: On BiPAP, ill-appearing, uncomfortable Respiratory system: She  has decreased breath sounds to right lung with significant lower crackles and rhonchi Cardiovascular system: Tachycardic, S1  & S2 heard, RRR. No JVD, murmurs, gallops, clicks or pedal edema. Gastrointestinal system: Abdomen is nondistended, soft and nontender. Normal bowel sounds heard. Central nervous system: Alert and oriented. No focal neurological deficits. Extremities: Symmetric 5 x 5 power.   Data Reviewed: Basic Metabolic Panel:  Recent Labs Lab 06/12/15 0817 07/08/2015 2056 06/17/15 0235 06/18/15 0336  NA 140 133* 137 139  K 4.1 4.1 3.9 4.5  CL  --  100* 105 103  CO2 '27 24 24 24  '$ GLUCOSE 85 110* 111* 174*  BUN 12.'3 10 8 9  '$ CREATININE 0.8 0.60 0.61 0.50  CALCIUM 10.1 9.1 8.6* 9.3   Liver Function Tests:  Recent Labs Lab 06/12/15 0817 07/16/2015 2056 06/17/15 0235  AST '15 22 19  '$ ALT 6 12* 11*  ALKPHOS 94 76 75  BILITOT 0.25 0.6 0.6  PROT 7.2 7.4 6.6  ALBUMIN 3.2* 3.4* 3.0*   No results for input(s): LIPASE, AMYLASE in the last 168 hours. No results for input(s): AMMONIA in the last 168 hours. CBC:  Recent Labs Lab 06/12/15 0817 07/11/2015 2056 06/17/15 0235 06/18/15 0336  WBC 4.7 7.2 6.8 11.2*  NEUTROABS 3.2 6.0 5.7 10.5*  HGB 9.5* 8.7* 8.8* 9.4*  HCT 29.2* 27.2* 27.2* 29.8*  MCV 87.9 86.9 86.6 87.6  PLT 308 254 216 231   Cardiac Enzymes:  Recent Labs Lab 06/17/15 0655  TROPONINI <0.03   BNP (last 3 results) No results for input(s): PROBNP in the last 8760 hours. CBG: No results for input(s): GLUCAP in the last 168 hours.  Recent Results (from the past 240 hour(s))  MRSA PCR Screening     Status: None   Collection Time: 06/17/15 12:31 AM  Result Value Ref Range Status   MRSA by PCR NEGATIVE NEGATIVE Final    Comment:        The GeneXpert MRSA Assay (FDA approved for NASAL specimens only), is one component of a comprehensive MRSA colonization surveillance program. It is not intended to diagnose MRSA infection nor to guide or monitor treatment for MRSA infections.           Studies: Dg Chest 2 View  07/13/2015   CLINICAL DATA:  Initial encounter for  shortness of breath with productive cough and right rib pain  EXAM: CHEST  2 VIEW  COMPARISON:  07/09/2015.  FINDINGS: Moderate right pleural effusion again noted with right base collapse/ consolidation persisting. There is pulmonary vascular congestion without overt pulmonary edema. Now with some parahilar airspace disease on the left. Cardiopericardial silhouette is at upper limits of normal for size. Left Port-A-Cath again noted with tip overlying the mid SVC. Telemetry leads overlie the chest. Imaged bony structures of the thorax are intact.  IMPRESSION: Stable small to moderate right pleural effusion with right base collapse/ consolidation.  Interval development of vascular congestion left parahilar airspace disease on the current study. Underlying component of pulmonary edema could have this appearance.   Electronically Signed   By: Misty Stanley M.D.   On: 06/24/2015 20:57   Ct Angio Chest Pe W/cm &/or Wo Cm  07/05/2015   CLINICAL DATA:  Subsequent encounter for feeling shortness of breath since yesterday with weakness. 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  EXAM: CT ANGIOGRAPHY CHEST WITH CONTRAST  TECHNIQUE: Multidetector CT imaging of the chest was performed using the standard protocol during bolus administration of intravenous  contrast. Multiplanar CT image reconstructions and MIPs were obtained to evaluate the vascular anatomy.  CONTRAST:  119m OMNIPAQUE IOHEXOL 350 MG/ML SOLN  COMPARISON:  PET-CT from 05/02/2015.  FINDINGS: Mediastinum / Lymph Nodes: There is no filling defect within the opacified pulmonary arteries to suggest the presence of an acute pulmonary embolus. No evidence for dissection of the thoracic aorta.  No axillary lymphadenopathy. 17 mm short axis precarinal lymph node is not substantially changed in the interval. 16 mm short axis subcarinal lymph node is again noted. There is a 16 mm short axis lymph node in the right hilum. Heart is mildly enlarged. No  pericardial effusion.  Lungs / Pleura: Lung windows show areas of interstitial lung ground-glass attenuation in the lungs bilaterally, left much greater than right. Volume loss in the right hemi thorax is stable. The fluid seen previously in the right major fissure has decreased. Left infrahilar nodule measures 2.1 cm today compared to 2.2 cm previously. There is right lower lobe collapse/consolidation. Right pleural effusion is not substantially changed in the interval.  MSK / Soft Tissues: Bone windows reveal no worrisome lytic or sclerotic osseous lesions.  Upper Abdomen: Heterogeneous perfusion noted in the liver, nonspecific. Small cyst in the posterior left kidney measures 11 mm.  Review of the MIP images confirms the above findings.  IMPRESSION: 1. No CT evidence for acute pulmonary embolus. 2. Right lower lobe collapse/consolidation with right pleural thickening and right pleural effusion. Overall imaging features are not substantially changed since the prior PET-CT. 3. Mediastinal and right hilar lymphadenopathy with left lower lobe pulmonary nodule, consistent with metastatic disease. 4. Interval development of interstitial and alveolar ground-glass attenuation, mainly in the left lung. Imaging features are nonspecific and differential considerations include pulmonary hemorrhage, bacterial or atypical infection, radiation pneumonitis, or lymphangitic tumor spread.   Electronically Signed   By: EMisty StanleyM.D.   On: 07/16/2015 23:01        Scheduled Meds: . antiseptic oral rinse  7 mL Mouth Rinse BID  . bisacodyl  10 mg Rectal Once  . cholecalciferol  2,000 Units Oral Daily  . enoxaparin (LOVENOX) injection  40 mg Subcutaneous Q24H  . feeding supplement (ENSURE ENLIVE)  237 mL Oral BID BM  . LORazepam  0.5 mg Intravenous Once  . methylPREDNISolone (SOLU-MEDROL) injection  60 mg Intravenous 3 times per day  . pantoprazole  40 mg Oral Daily  . piperacillin-tazobactam (ZOSYN)  IV  3.375 g  Intravenous Q8H  . pravastatin  40 mg Oral Daily  . vancomycin  500 mg Intravenous Q12H   Continuous Infusions:    Principal Problem:   Acute respiratory failure with hypoxia Active Problems:   Cancer of hypopharynx   Anemia due to antineoplastic chemotherapy    Time spent: 40 minutes.    ZKelvin Cellar MD, FACP, FHM. [facility] Pager [phone] If 7PM-7AM, please contact night-coverage [email or web address] Password [email or web address] 06/18/2015, 7:35 AM    LOS: 2 days

## 2015-06-18 NOTE — Progress Notes (Signed)
Name: Sherry Craig MRN: [id number] DOB: 1946-01-19    ADMISSION DATE:  07/08/2015 CONSULTATION DATE:  8/2  REFERRING MD :  [email or web address]   CHIEF COMPLAINT:  Dyspnea, pulmonary infiltrates and effusion   BRIEF PATIENT DESCRIPTION:   79 yof w/ h/o recurrent head/neck cancer w/ known mets to bone, lung and right pleural space. Currently s/p s/p cycle 4 of carboplatin and taxol (last on 7/28). Admitted on 8/2 w 3-4d h/o progressive dyspnea. CT on admit showed stable right loculated effusion and new ground glass pulmonary infiltrates. PCCM was asked to see later that day to further evaluate her dyspnea as well as comment on the right pleural effusion.   SIGNIFICANT EVENTS  8/2 PCCM consulted for worsening resp failure. We felt effusion not sig to cause sx burden and started empiric steroid trial given concern that this might be a pneumonitis process.  8/3 worsening respiratory failure over-night. Placed on BIPAP. Discussed goals of care w/ husband who agreed to DNR but wanted short term mechanical ventilation in hopes that this could be a reversible process.   STUDIES:  CT chest 8/1: 1. No CT evidence for acute pulmonary embolus.2. Right lower lobe collapse/consolidation with right pleuralthickening and right pleural effusion. Overall imaging features arenot substantially changed since the prior PET-CT.3. Mediastinal and right hilar lymphadenopathy with left lower lobe pulmonary nodule, consistent with metastatic disease.4. Interval development of interstitial and alveolar ground-glass attenuation, mainly in the left lung. Imaging features are nonspecific and differential considerations include pulmonary hemorrhage, bacterial or atypical infection, radiation pneumonitis, or lymphangitic tumor spread. Echo 8/2>>> EF 98-33%; grade 2 diastolic dysfxn. Estimated CVP 8, PAS estimated 48 mmHg   VITAL SIGNS: Temp:  [96.7 F (35.9 C)-98.8 F (37.1 C)] 96.7 F (35.9 C) (08/03 0400) Pulse Rate:   [77-123] 105 (08/03 0600) Resp:  [15-36] 36 (08/03 0600) BP: (123-152)/(65-99) 137/90 mmHg (08/03 0600) SpO2:  [72 %-100 %] 100 % (08/03 0600) FiO2 (%):  [60 %-100 %] 100 % (08/03 0800) 5 liters  PHYSICAL EXAMINATION: General:  Frail 69 year old female, now slow to respond. More hypercarbic and lethargic w/ marked accessory muscle use and paradoxical resp pattern  Neuro:  Awake, alert, no focal def  HEENT:  NCAT, mild jugular distention, BIPAP mask in place  Cardiovascular:  Tachy rrr Lungs:  Crackles bilateral bases. L>R. Decreased right base + accessory muscle use  Abdomen:  Soft, non-tender, + bowel sounds  Musculoskeletal:  Intact  Skin:  Intact   CBC Recent Labs     06/19/2015  2056  06/17/15  0235  06/18/15  0336  WBC  7.2  6.8  11.2*  HGB  8.7*  8.8*  9.4*  HCT  27.2*  27.2*  29.8*  PLT  254  216  231    Coag's No results for input(s): APTT, INR in the last 72 hours.  BMET Recent Labs     06/17/2015  2056  06/17/15  0235  06/18/15  0336  NA  133*  137  139  K  4.1  3.9  4.5  CL  100*  105  103  CO2  '24  24  24  '$ BUN  '10  8  9  '$ CREATININE  0.60  0.61  0.50  GLUCOSE  110*  111*  174*    Electrolytes Recent Labs     07/13/2015  2056  06/17/15  0235  06/18/15  0336  CALCIUM  9.1  8.6*  9.3    Sepsis  Markers Recent Labs     06/17/15  0235  PROCALCITON  0.19    ABG Recent Labs     06/18/15  0733  PHART  7.245*  PCO2ART  58.2*  PO2ART  120*    Liver Enzymes Recent Labs     07/13/2015  2056  06/17/15  0235  AST  22  19  ALT  12*  11*  ALKPHOS  76  75  BILITOT  0.6  0.6  ALBUMIN  3.4*  3.0*    Cardiac Enzymes Recent Labs     06/17/15  0655  TROPONINI  <0.03    Glucose No results for input(s): GLUCAP in the last 72 hours.  Imaging Dg Chest 2 View  07/15/2015   CLINICAL DATA:  Initial encounter for shortness of breath with productive cough and right rib pain  EXAM: CHEST  2 VIEW  COMPARISON:  07/09/2015.  FINDINGS: Moderate right  pleural effusion again noted with right base collapse/ consolidation persisting. There is pulmonary vascular congestion without overt pulmonary edema. Now with some parahilar airspace disease on the left. Cardiopericardial silhouette is at upper limits of normal for size. Left Port-A-Cath again noted with tip overlying the mid SVC. Telemetry leads overlie the chest. Imaged bony structures of the thorax are intact.  IMPRESSION: Stable small to moderate right pleural effusion with right base collapse/ consolidation.  Interval development of vascular congestion left parahilar airspace disease on the current study. Underlying component of pulmonary edema could have this appearance.   Electronically Signed   By: Misty Stanley M.D.   On: 06/22/2015 20:57   Ct Angio Chest Pe W/cm &/or Wo Cm  07/12/2015   CLINICAL DATA:  Subsequent encounter for feeling shortness of breath since yesterday with weakness. 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  EXAM: CT ANGIOGRAPHY CHEST WITH CONTRAST  TECHNIQUE: Multidetector CT imaging of the chest was performed using the standard protocol during bolus administration of intravenous contrast. Multiplanar CT image reconstructions and MIPs were obtained to evaluate the vascular anatomy.  CONTRAST:  172m OMNIPAQUE IOHEXOL 350 MG/ML SOLN  COMPARISON:  PET-CT from 05/02/2015.  FINDINGS: Mediastinum / Lymph Nodes: There is no filling defect within the opacified pulmonary arteries to suggest the presence of an acute pulmonary embolus. No evidence for dissection of the thoracic aorta.  No axillary lymphadenopathy. 17 mm short axis precarinal lymph node is not substantially changed in the interval. 16 mm short axis subcarinal lymph node is again noted. There is a 16 mm short axis lymph node in the right hilum. Heart is mildly enlarged. No pericardial effusion.  Lungs / Pleura: Lung windows show areas of interstitial lung ground-glass attenuation in the lungs bilaterally,  left much greater than right. Volume Craig in the right hemi thorax is stable. The fluid seen previously in the right major fissure has decreased. Left infrahilar nodule measures 2.1 cm today compared to 2.2 cm previously. There is right lower lobe collapse/consolidation. Right pleural effusion is not substantially changed in the interval.  MSK / Soft Tissues: Bone windows reveal no worrisome lytic or sclerotic osseous lesions.  Upper Abdomen: Heterogeneous perfusion noted in the liver, nonspecific. Small cyst in the posterior left kidney measures 11 mm.  Review of the MIP images confirms the above findings.  IMPRESSION: 1. No CT evidence for acute pulmonary embolus. 2. Right lower lobe collapse/consolidation with right pleural thickening and right pleural effusion. Overall imaging features are not substantially changed since the prior PET-CT. 3.  Mediastinal and right hilar lymphadenopathy with left lower lobe pulmonary nodule, consistent with metastatic disease. 4. Interval development of interstitial and alveolar ground-glass attenuation, mainly in the left lung. Imaging features are nonspecific and differential considerations include pulmonary hemorrhage, bacterial or atypical infection, radiation pneumonitis, or lymphangitic tumor spread.   Electronically Signed   By: Misty Stanley M.D.   On: 07/13/2015 23:01   Dg Chest Port 1 View  06/18/2015   CLINICAL DATA:  Shortness of breath. Right pleural effusion. Hypopharyngeal carcinoma  EXAM: PORTABLE CHEST - 1 VIEW  COMPARISON:  Chest radiograph and chest CT June 16, 2015  FINDINGS: There is a partially loculated right pleural effusion which appears stable. There is widespread alveolar opacity throughout the left lung, slightly increased. There is a nodular lesion in the left lower lobe measuring 1.8 x 1.3 cm, corresponding to the lesion seen on recent CT. Heart is normal in size and contour. The pulmonary vascularity is within normal limits. Port-A-Cath tip is in  the superior vena cava. No pneumothorax. No bone lesions. Mild hilar fullness on the right is consistent with the mild adenopathy noted on recent CT in this area.  IMPRESSION: Persistent loculated effusion on the right. Extensive airspace opacity throughout the left lung. Differential considerations must include infection, hemorrhage, or lymphangitic spread of tumor. It is possible that there is a degree of underlying ARDS. No change in cardiac silhouette.   Electronically Signed   By: Lowella Grip III M.D.   On: 06/18/2015 08:14        ASSESSMENT / PLAN:  PULMONARY A: Acute hypoxic and Hypercarbic respiratory failure (room air sats 86% on admit) in the setting of diffuse ground glass pulmonary infiltrates L>R: d-dx of concern primarily chemo-induced pneumonitis vs infectious (either bacterial or viral) vs lymphangitic spread vs edema (doubt edema). She has deteriorated remarkably over the last 24 hrs. Not sure that this is a reversible process; but oncology felt had been stable from a cancer stand-point.  Right malignant pleural effusion: this has not changed significantly radiographically and is would not explain the level of symptom burden she is experiencing.  P: Cont current abx f/u resp viral panel continue steroid trial for pneumonitis (started 8/2) Intubate/ventilate: ARDS protocol PAD protocol  BAL: if lavage + for cancer cells then prognostically this is unlikely to be reversible.   CARDIOVASCULAR  A:  SIRS. R/o sepsis.  Lactic acid on presentation was neg. PCT was neg. Clinical picture not compelling story for infection P: Keep even to neg vol Tele CVP would be helpful to guide volume management   Renal A: No acute P: Strict I&O F/u chem Renal dose meds as appropriate  GASTROINTESTINAL  A: pcm P: Place feeding tube Start tube feeds PPI for SUP  Heme A: Anemia of chronic disease  Metastatic head and neck cancer w/ clinical progression of disease in  spite of aggressive rx.  P: Cont LMWH Trend cbc and transfuse per usual ICU guidelines Per heme/onc; although if Resp viral panel is negative and pt improves w/ steroids may need to reconsider chemo-therapy regimen.   ENDOCRINE A: Hyperglycemia  P: ssi  Neuro: A: acute encephalopathy. MS has worsened. Almost certainly as a consequence of her hypercarbia and hypoxia  P: PAD protocol  RAS goal -2  Supportive care  INFECTIOUS DISEASE A: R/o PNA viral vs bacterial. See above pulm section  P: F/u RVP 8/2>>> BAL 8/3>>> BCX2 8/1>>> UC 8/1>>> vanc 8/1>>> Zosyn 8/1>>> azithro 8/3>>>    Clinically worse.  Now needs mechanical ventilation. Best hope here is that this is a PNA or pneumonitis. More inclined to think pneumonitis BUT lymphangitic  spread remains a possibility. Have agreed to trial short term intubation in hopes that this may yet be a steroid responsive process. In addition will add atypical coverage. Husband and pt do not want prolonged care. After intubation we will get BAL >>>if cancer cells identified then prognostically would be helpful. She will be DNR based on our discussion but will cont current aggressive care.   Erick Colace ACNP-BC [place] Pager # [phone] OR # [phone] if no answer   06/18/2015, 8:50 AM

## 2015-06-19 ENCOUNTER — Ambulatory Visit: Payer: Self-pay

## 2015-06-19 ENCOUNTER — Other Ambulatory Visit: Payer: Self-pay

## 2015-06-19 ENCOUNTER — Encounter: Payer: Self-pay | Admitting: Nutrition

## 2015-06-19 DIAGNOSIS — J9601 Acute respiratory failure with hypoxia: Secondary | ICD-10-CM

## 2015-06-19 LAB — GLUCOSE, CAPILLARY
Glucose-Capillary: 180 mg/dL — ABNORMAL HIGH (ref 65–99)
Glucose-Capillary: 155 mg/dL — ABNORMAL HIGH (ref 65–99)
Glucose-Capillary: 160 mg/dL — ABNORMAL HIGH (ref 65–99)
Glucose-Capillary: 152 mg/dL — ABNORMAL HIGH (ref 65–99)
Glucose-Capillary: 179 mg/dL — ABNORMAL HIGH (ref 65–99)
Glucose-Capillary: 177 mg/dL — ABNORMAL HIGH (ref 65–99)

## 2015-06-19 LAB — PROCALCITONIN: PROCALCITONIN: 1.62 ng/mL

## 2015-06-19 LAB — BLOOD GAS, ARTERIAL
ACID-BASE EXCESS: 0.5 mmol/L (ref 0.0–2.0)
Bicarbonate: 25.7 mEq/L — ABNORMAL HIGH (ref 20.0–24.0)
Drawn by: 232811
FIO2: 0.4
LHR: 30 {breaths}/min
O2 SAT: 90.4 %
PEEP: 8 cmH2O
PH ART: 7.355 (ref 7.350–7.450)
Patient temperature: 98
TCO2: 24.6 mmol/L (ref 0–100)
VT: 340 mL
pCO2 arterial: 47 mmHg — ABNORMAL HIGH (ref 35.0–45.0)
pO2, Arterial: 68.6 mmHg — ABNORMAL LOW (ref 80.0–100.0)

## 2015-06-19 LAB — BASIC METABOLIC PANEL
Anion gap: 8 (ref 5–15)
BUN: 12 mg/dL (ref 6–20)
CO2: 26 mmol/L (ref 22–32)
Calcium: 8.9 mg/dL (ref 8.9–10.3)
Chloride: 102 mmol/L (ref 101–111)
Creatinine, Ser: 0.68 mg/dL (ref 0.44–1.00)
GFR calc Af Amer: >60 mL/min (ref >60)
GFR calc non Af Amer: >60 mL/min (ref >60)
GLUCOSE: 169 mg/dL — AB (ref 65–99)
Potassium: 4.5 mmol/L (ref 3.5–5.1)
SODIUM: 136 mmol/L (ref 135–145)

## 2015-06-19 LAB — VANCOMYCIN, TROUGH: VANCOMYCIN TR: 10 ug/mL (ref 10.0–20.0)

## 2015-06-19 MED ORDER — VANCOMYCIN HCL IN DEXTROSE 750-5 MG/150ML-% IV SOLN
750.0000 mg | Freq: Two times a day (BID) | INTRAVENOUS | Status: DC
Start: 2015-06-19 — End: 2015-06-20
  Administered 2015-06-19 – 2015-06-20 (×2): 750 mg via INTRAVENOUS
  Filled 2015-06-19 (×3): qty 150

## 2015-06-19 MED ORDER — ALTEPLASE 2 MG IJ SOLR
2.0000 mg | Freq: Once | INTRAMUSCULAR | Status: AC
  Administered 2015-06-19: 2 mg
  Filled 2015-06-19: qty 2

## 2015-06-19 MED ORDER — SODIUM CHLORIDE 0.9 % IJ SOLN
10.0000 mL | Freq: Two times a day (BID) | INTRAMUSCULAR | Status: DC
  Administered 2015-06-19 – 2015-06-24 (×8): 10 mL

## 2015-06-19 MED ORDER — SODIUM CHLORIDE 0.9 % IV SOLN
INTRAVENOUS | Status: DC
  Administered 2015-06-19 – 2015-06-20 (×2): via INTRAVENOUS

## 2015-06-19 MED ORDER — SODIUM CHLORIDE 0.9 % IJ SOLN
10.0000 mL | INTRAMUSCULAR | Status: DC | PRN
  Administered 2015-06-19: 20 mL
  Administered 2015-06-21: 10 mL
  Filled 2015-06-19: qty 40

## 2015-06-19 MED ORDER — SODIUM CHLORIDE 0.9 % IV SOLN
INTRAVENOUS | Status: DC
  Administered 2015-06-19: 12:00:00 via INTRAVENOUS

## 2015-06-19 NOTE — Progress Notes (Signed)
Dr. Bari Mantis previous note talked about DNR status however order is still for Full Code. Clarified with husband at bedside that family is still deciding and will remain a full code for now. Dr. Halford Chessman aware.

## 2015-06-19 NOTE — Progress Notes (Signed)
Sherry Craig   DOB:1945/11/28   WC#:[id number]   MPN#:[id number]  Patient Care Team: Lester Kinsman, PA-C as PCP - General (Physician Assistant) Thea Silversmith, MD (Radiation Oncology) Jerrell Belfast, MD (Otolaryngology) Heath Lark, MD as Consulting Physician (Hematology and Oncology) Leota Sauers, RN as Oncology Nurse Navigator (Oncology) Karie Mainland, RD as Dietitian (Nutrition)  I have seen the patient, examined her and edited the notes as follows  Subjective: patient seen and examined. She remains intubated and sedated. Other history cannot be obtained at this time due to current status.  Scheduled Meds: . antiseptic oral rinse  7 mL Mouth Rinse QID  . azithromycin  500 mg Intravenous Q24H  . chlorhexidine  15 mL Mouth Rinse BID  . enoxaparin (LOVENOX) injection  40 mg Subcutaneous Q24H  . feeding supplement (VITAL AF 1.2 CAL)  1,000 mL Per Tube Q24H  . insulin aspart  0-9 Units Subcutaneous 6 times per day  . methylPREDNISolone (SOLU-MEDROL) injection  60 mg Intravenous 3 times per day  . pantoprazole sodium  40 mg Per Tube Q1200  . piperacillin-tazobactam (ZOSYN)  IV  3.375 g Intravenous Q8H  . pravastatin  40 mg Per Tube Daily  . vancomycin  500 mg Intravenous Q12H   Continuous Infusions: . fentaNYL infusion INTRAVENOUS 100 mcg/hr (06/19/15 0600)  . phenylephrine (NEO-SYNEPHRINE) Adult infusion 75 mcg/min (06/19/15 0600)  . propofol (DIPRIVAN) infusion 20 mcg/kg/min (06/19/15 0645)   PRN Meds:.acetaminophen **OR** acetaminophen, fentaNYL  Objective:  Filed Vitals:   06/19/15 0630  BP: 103/67  Pulse: 53  Temp:   Resp: 14      Intake/Output Summary (Last 24 hours) at 06/19/15 0658 Last data filed at 06/19/15 0600  Gross per 24 hour  Intake 2860.2 ml  Output   1345 ml  Net 1515.2 ml     GENERAL: Intubated and sedated  SKIN: skin color, texture, turgor are normal, no rashes or significant lesions EYES: Closed OROPHARYNX ET tube in  situ. NECK: supple, thyroid normal size, non-tender, without nodularity LYMPH:  no palpable lymphadenopathy in the cervical, axillary or inguinal LUNGS: improved aeration, trace rales bibasillarly, no rhonchi or wheezing. Decreased breath sounds on the right base. HEART: regular rate & rhythm and no murmurs and no lower extremity edema ABDOMEN: soft, non-tender and normal bowel sounds   CBG (last 3)   Recent Labs  06/18/15 1928 06/18/15 2348 06/19/15 0422  GLUCAP 180* 194* 155*     Labs:   Recent Labs Lab 06/12/15 0817 06/26/2015 2056 06/17/15 0235 06/18/15 0336  WBC 4.7 7.2 6.8 11.2*  HGB 9.5* 8.7* 8.8* 9.4*  HCT 29.2* 27.2* 27.2* 29.8*  PLT 308 254 216 231  MCV 87.9 86.9 86.6 87.6  MCH 28.5 27.8 28.0 27.6  MCHC 32.4 32.0 32.4 31.5  RDW 18.1* 18.3* 18.2* 18.1*  LYMPHSABS 0.7* 0.9 0.8 0.5*  MONOABS 0.6 0.3 0.3 0.3  EOSABS 0.1 0.1 0.0 0.0  BASOSABS 0.1 0.0 0.0 0.0     Chemistries:    Recent Labs Lab 06/12/15 0817 06/28/2015 2056 06/17/15 0235 06/18/15 0336 06/19/15 0520  NA 140 133* 137 139 136  K 4.1 4.1 3.9 4.5 4.5  CL  --  100* 105 103 102  CO2 '27 24 24 24 26  '$ GLUCOSE 85 110* 111* 174* 169*  BUN 12.'3 10 8 9 12  '$ CREATININE 0.8 0.60 0.61 0.50 0.68  CALCIUM 10.1 9.1 8.6* 9.3 8.9  AST '15 22 19  '$ --   --   ALT 6  12* 11*  --   --   ALKPHOS 94 76 75  --   --   BILITOT 0.25 0.6 0.6  --   --     GFR Estimated Creatinine Clearance: 52.2 mL/min (by C-G formula based on Cr of 0.68).  Liver Function Tests:  Recent Labs Lab 06/12/15 0817 06/17/2015 2056 06/17/15 0235  AST '15 22 19  '$ ALT 6 12* 11*  ALKPHOS 94 76 75  BILITOT 0.25 0.6 0.6  PROT 7.2 7.4 6.6  ALBUMIN 3.2* 3.4* 3.0*     Cardiac Enzymes:  Recent Labs Lab 06/17/15 0655  TROPONINI <0.03    Thyroid function studies  Recent Labs  06/17/15 0237  TSH 1.743   Microbiology Negative to date   Imaging Studies:  None today  Assessment/Plan: 69 y.o.  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 Imaging study between the PET CT scan prior to treatment and CT scan of the chest were compared Overall, she is improving with treatment  Continue supportive care for now   Acute respiratory failure with hypoxia.  She reported increasing shortness of breath with minimal exertion and non productive cough since her last chemo. Chest X ray on 8/2 showed Stable small to moderate right pleural effusion with right base collapse/ consolidation, with possible pulmonary edema.  CT angio chest on 8/1 was negative for pulmonary embolism, similar chest x ray findings, suspicious for pneumonia. She was transferred to the SDU, and began management by CCM/ Pulmonary, with IV antibiotics and supportive therapy. Cultures were obtained, results pending. Due to the fact that she appeared to be responding to treatment on recent imaging study, aggressive care is recommended Infiltrate on the left lung is highly suspicious for infection. Less likely would be side effects related to chemotherapy. Respiratory status worsened on 8/3 requiring intubation  Bronchoscopy was performed on 8/3 Cytology results are pending. Continue high dose steroids dose  Anemia due to antineoplastic chemotherapy This is likely due to recent treatment. No recent history of bleeding was noted,such as epistaxis, hematuria or hematochezia.  She has been asymptomatic from the anemia. Will observe for now. She does not require transfusion now.   Protein calorie malnutrition Appreciate Nutrition involvement  DVT prophylaxis On Lovenox  Full Code  Other medical issues as per admitting team  Will follow   Rondel Jumbo, PA-C 06/19/2015  6:58 AM [name], MD 06/19/2015

## 2015-06-19 NOTE — Progress Notes (Signed)
ANTIBIOTIC CONSULT NOTE - FOLLOW UP  Pharmacy Consult for Vancomycin, Zosyn Indication: rule out pneumonia  No Known Allergies  Patient Measurements: Height: '5\' 1"'$  (154.9 cm) Weight: 123 lb 14.4 oz (56.2 kg) IBW/kg (Calculated) : 47.8  Vital Signs: Temp: 98.3 F (36.8 C) (08/04 0752) Temp Source: Oral (08/04 0752) BP: 80/51 mmHg (08/04 0800) Pulse Rate: 71 (08/04 0800) Intake/Output from previous day: 08/03 0701 - 08/04 0700 In: 2860.2 [I.V.:1840.2; NG/GT:420; IV Piggyback:600] Out: 9935 [Urine:1345]  Labs:  Recent Labs  06/27/2015 2056 06/17/15 0235 06/18/15 0336 06/19/15 0520  WBC 7.2 6.8 11.2*  --   HGB 8.7* 8.8* 9.4*  --   PLT 254 216 231  --   CREATININE 0.60 0.61 0.50 0.68   Estimated Creatinine Clearance: 52.2 mL/min (by C-G formula based on Cr of 0.68).  Recent Labs  06/19/15 1105  Porter 10    Assessment: Sherry Craig admitted on 8/1 with dyspnea, pulmonary infiltrates and effusion.  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  CT shows loculated effusion and new infiltrates.  Pharmacy was consulted to dose vancomycin and Zosyn for HCAP.  On 8/3 her respiratory status worsened and required intubation, MD added azithromycin for atypical coverage.  8/2 >> zosyn >> 8/2 >> vancomycin >>  8/3 >> azithromycin >>  8/1 blood: ngtd 8/1 urine: NGF 8/1 Urine Strep pneumo Ag: negative 8/1 Urine Legionella Ag: negative 8/2 MRSA PCR: negative 8/2 Respiratory virus panel: negative 8/3 BAL:   Today, 06/19/2015: Day # 3 Vanc/Zosyn  Afebrile  WBC 11.2 (solumedrol started 8/2)  SCr 0.68 with CrCl ~ 52 ml/min  Vancomycin trough level = 10, below goal level of 15-20  Goal of Therapy:  Vancomycin trough level 15-20 mcg/ml  Appropriate abx dosing, eradication of infection.   Plan:   Continue Zosyn 3.375g IV Q8H infused over 4hrs.   Increase to Vancomycin '750mg'$  IV q12h.  Measure Vanc trough at  steady state.  Follow up renal fxn, culture results, and clinical course.  Gretta Arab PharmD, BCPS Pager [phone] 06/19/2015 9:21 AM

## 2015-06-19 NOTE — Progress Notes (Signed)
Extreme agitation and restlessness after wake up assessment and sedation decreased. Boluses x2 with  Fentanyl 50 mcg over 45 minute period. Pt coughing and feels like she's choking. RR rate up to 50 at times. Sedation resumed with Fentanyl 125 mcg at this time.

## 2015-06-19 NOTE — Progress Notes (Signed)
Name: Sherry Craig MRN: [id number] DOB: February 06, 1946    ADMISSION DATE:  06/28/2015 CONSULTATION DATE:  8/2  REFERRING MD :  [email or web address]   CHIEF COMPLAINT:  Dyspnea, pulmonary infiltrates and effusion   BRIEF PATIENT DESCRIPTION:   69 yof w/ h/o recurrent head/neck cancer w/ known mets to bone, lung and right pleural space. Currently s/p s/p cycle 4 of carboplatin and taxol (last on 7/28). Admitted on 8/2 w 3-4d h/o progressive dyspnea. CT on admit showed stable right loculated effusion and new ground glass pulmonary infiltrates. PCCM was asked to see later that day to further evaluate her dyspnea as well as comment on the right pleural effusion.   SIGNIFICANT EVENTS  8/2 PCCM consulted for worsening resp failure. We felt effusion not sig to cause sx burden and started empiric steroid trial given concern that this might be a pneumonitis process.  8/3 worsening respiratory failure over-night. Placed on BIPAP. Discussed goals of care w/ husband who agreed to DNR but wanted short term mechanical ventilation in hopes that this could be a reversible process.   STUDIES:  CT chest 8/1: 1. No CT evidence for acute pulmonary embolus.2. Right lower lobe collapse/consolidation with right pleuralthickening and right pleural effusion. Overall imaging features arenot substantially changed since the prior PET-CT.3. Mediastinal and right hilar lymphadenopathy with left lower lobe pulmonary nodule, consistent with metastatic disease.4. Interval development of interstitial and alveolar ground-glass attenuation, mainly in the left lung. Imaging features are nonspecific and differential considerations include pulmonary hemorrhage, bacterial or atypical infection, radiation pneumonitis, or lymphangitic tumor spread. Echo 8/2>>> EF 38-75%; grade 2 diastolic dysfxn. Estimated CVP 8, PAS estimated 48 mmHg   SUBJ  - afebrile No obvious pain Agitated on WUA  VITAL SIGNS: Temp:  [97.1 F (36.2 C)-98.3 F  (36.8 C)] 98.3 F (36.8 C) (08/04 0752) Pulse Rate:  [51-76] 71 (08/04 0800) Resp:  [7-30] 23 (08/04 0800) BP: (63-143)/(40-84) 80/51 mmHg (08/04 0800) SpO2:  [87 %-100 %] 94 % (08/04 0800) FiO2 (%):  [40 %-100 %] 40 % (08/04 0848) Weight:  [123 lb 14.4 oz (56.2 kg)] 123 lb 14.4 oz (56.2 kg) (08/04 0400)   PHYSICAL EXAMINATION: General:  Frail 69 year old female, intubated Neuro:  RASS-1, no focal def  HEENT:  NCAT, mild jugular distention, oral ETT Cardiovascular:  Tachy rrr Lungs:  Crackles bilateral bases. L>R. Decreased right base + accessory muscle use  Abdomen:  Soft, non-tender, + bowel sounds  Musculoskeletal:  Intact  Skin:  Intact   CBC Recent Labs     07/10/2015  2056  06/17/15  0235  06/18/15  0336  WBC  7.2  6.8  11.2*  HGB  8.7*  8.8*  9.4*  HCT  27.2*  27.2*  29.8*  PLT  254  216  231    Coag's No results for input(s): APTT, INR in the last 72 hours.  BMET Recent Labs     06/17/15  0235  06/18/15  0336  06/19/15  0520  NA  137  139  136  K  3.9  4.5  4.5  CL  105  103  102  CO2  '24  24  26  '$ BUN  '8  9  12  '$ CREATININE  0.61  0.50  0.68  GLUCOSE  111*  174*  169*    Electrolytes Recent Labs     06/17/15  0235  06/18/15  0336  06/19/15  0520  CALCIUM  8.6*  9.3  8.9    Sepsis Markers Recent Labs     06/17/15  0235  06/19/15  0520  PROCALCITON  0.19  1.62    ABG Recent Labs     06/18/15  1600  06/18/15  1706  06/19/15  0412  PHART  7.307*  7.334*  7.355  PCO2ART  50.2*  47.8*  47.0*  PO2ART  53.2*  99.2  68.6*    Liver Enzymes Recent Labs     07/13/2015  2056  06/17/15  0235  AST  22  19  ALT  12*  11*  ALKPHOS  76  75  BILITOT  0.6  0.6  ALBUMIN  3.4*  3.0*    Cardiac Enzymes Recent Labs     06/17/15  0655  TROPONINI  <0.03    Glucose Recent Labs     06/18/15  1339  06/18/15  1612  06/18/15  1928  06/18/15  2348  06/19/15  0422  06/19/15  0744  GLUCAP  269*  237*  180*  194*  155*  160*    Imaging Dg  Abd 1 View  06/18/2015   CLINICAL DATA:  Orogastric tube placement  EXAM: ABDOMEN - 1 VIEW  COMPARISON:  None.  FINDINGS: Orogastric tube tip and side port are in the stomach. The bowel gas pattern is unremarkable. No obstruction or free air. There is moderate stool in the colon.  IMPRESSION: Orogastric tube tip and side port in stomach. Overall bowel gas pattern unremarkable.   Electronically Signed   By: Lowella Grip III M.D.   On: 06/18/2015 11:43   Dg Chest Port 1 View  06/18/2015   CLINICAL DATA:  Endotracheal tube placement  EXAM: PORTABLE CHEST - 1 VIEW  COMPARISON:  06/18/2015  FINDINGS: Cardiomediastinal silhouette is stable. NG tube in place. Stable left Port-A-Cath position. Persistent loculated right pleural effusion with right lower lobe consolidation. Diffuse airspace disease left lung again noted. Endotracheal tube in place with tip 2 cm above the carina. There is no pneumothorax.  IMPRESSION: Endotracheal and NG tube in place. No pneumothorax. Stable left Port-A-Cath position. Diffuse airspace is left lung again noted. Stable loculated right pleural effusion with right lower lobe consolidation.   Electronically Signed   By: Lahoma Crocker M.D.   On: 06/18/2015 10:19   Dg Chest Port 1 View  06/18/2015   CLINICAL DATA:  Shortness of breath. Right pleural effusion. Hypopharyngeal carcinoma  EXAM: PORTABLE CHEST - 1 VIEW  COMPARISON:  Chest radiograph and chest CT June 16, 2015  FINDINGS: There is a partially loculated right pleural effusion which appears stable. There is widespread alveolar opacity throughout the left lung, slightly increased. There is a nodular lesion in the left lower lobe measuring 1.8 x 1.3 cm, corresponding to the lesion seen on recent CT. Heart is normal in size and contour. The pulmonary vascularity is within normal limits. Port-A-Cath tip is in the superior vena cava. No pneumothorax. No bone lesions. Mild hilar fullness on the right is consistent with the mild adenopathy  noted on recent CT in this area.  IMPRESSION: Persistent loculated effusion on the right. Extensive airspace opacity throughout the left lung. Differential considerations must include infection, hemorrhage, or lymphangitic spread of tumor. It is possible that there is a degree of underlying ARDS. No change in cardiac silhouette.   Electronically Signed   By: Lowella Grip III M.D.   On: 06/18/2015 08:14        ASSESSMENT / PLAN:  PULMONARY  A: Acute hypoxic and Hypercarbic respiratory failure (room air sats 86% on admit) in the setting of diffuse ground glass pulmonary infiltrates L>R: d-dx of concern primarily chemo-induced pneumonitis vs infectious (either bacterial or viral) vs lymphangitic spread vs edema (doubt edema). She has deteriorated remarkably over the last 24 hrs. Not sure that this is a reversible process; but oncology felt had been stable from a cancer stand-point.  Right malignant pleural effusion: this has not changed significantly radiographically and is would not explain the level of symptom burden she is experiencing.  P: continue steroid trial for pneumonitis (started 8/2) ARDS protocol Follow BAL: if lavage + for cancer cells then prognostically this is unlikely to be reversible.   CARDIOVASCULAR  A:  SIRS. R/o sepsis.  Lactic acid on presentation was neg. PCT was neg. Clinical picture not compelling story for infection P: Keep even to neg vol Ct neo gtt Tele CVP if cvl placed  Renal A: No acute P: Strict I&O F/u chem Renal dose meds as appropriate  GASTROINTESTINAL  A: pcm P: Place feeding tube Start tube feeds PPI for SUP  Heme A: Anemia of chronic disease  Metastatic head and neck cancer w/ clinical progression of disease in spite of aggressive rx.  P: Cont LMWH Trend cbc and transfuse per usual ICU guidelines Per heme/onc; although if Resp viral panel is negative and pt improves w/ steroids may need to reconsider chemo-therapy regimen.     ENDOCRINE A: Hyperglycemia  P: ssi  Neuro: A: acute encephalopathy. MS has worsened. Almost certainly as a consequence of her hypercarbia and hypoxia  P: PAD protocol  RAS goal -2 to -1 Supportive care  INFECTIOUS DISEASE A: R/o PNA viral vs bacterial. See above pulm section  P: F/u RVP 8/2 (swab)>>>neg RVP 8/3 (BAL ) >> BAL 8/3>>> BCX2 8/1>>> UC 8/1>>> vanc 8/1>>> Zosyn 8/1>>> azithro 8/3>>>   Family - updated husband & daughter 8/4    Summary  Best hope here is that this is a PNA or pneumonitis. More inclined to think pneumonitis BUT lymphangitic  spread remains a possibility.Plan is to trial short term intubation in hopes that this may yet be a steroid responsive process. Husband and pt do not want prolonged care.  She will be DNR based on our discussion but will cont current aggressive care.   The patient is critically ill with multiple organ systems failure and requires high complexity decision making for assessment and support, frequent evaluation and titration of therapies, application of advanced monitoring technologies and extensive interpretation of multiple databases. Critical Care Time devoted to patient care services described in this note independent of APP time is 35 minutes.   Kara Mead MD. [facility] Flood. [facility] Pager [phone] If no response call [phone]     06/19/2015, 9:54 AM

## 2015-06-20 ENCOUNTER — Inpatient Hospital Stay (HOSPITAL_COMMUNITY): Payer: Medicare Other

## 2015-06-20 LAB — BASIC METABOLIC PANEL
Anion gap: 9 (ref 5–15)
BUN: 17 mg/dL (ref 6–20)
CO2: 29 mmol/L (ref 22–32)
Calcium: 8.6 mg/dL — ABNORMAL LOW (ref 8.9–10.3)
Chloride: 100 mmol/L — ABNORMAL LOW (ref 101–111)
Creatinine, Ser: 0.68 mg/dL (ref 0.44–1.00)
GFR calc Af Amer: >60 mL/min (ref >60)
GFR calc non Af Amer: >60 mL/min (ref >60)
Glucose, Bld: 170 mg/dL — ABNORMAL HIGH (ref 65–99)
Potassium: 4.6 mmol/L (ref 3.5–5.1)
SODIUM: 138 mmol/L (ref 135–145)

## 2015-06-20 LAB — GLUCOSE, CAPILLARY
Glucose-Capillary: 100 mg/dL — ABNORMAL HIGH (ref 65–99)
Glucose-Capillary: 119 mg/dL — ABNORMAL HIGH (ref 65–99)
Glucose-Capillary: 124 mg/dL — ABNORMAL HIGH (ref 65–99)
Glucose-Capillary: 129 mg/dL — ABNORMAL HIGH (ref 65–99)
Glucose-Capillary: 131 mg/dL — ABNORMAL HIGH (ref 65–99)
Glucose-Capillary: 152 mg/dL — ABNORMAL HIGH (ref 65–99)

## 2015-06-20 LAB — BLOOD GAS, ARTERIAL
ACID-BASE EXCESS: 1.5 mmol/L (ref 0.0–2.0)
Bicarbonate: 28.7 mEq/L — ABNORMAL HIGH (ref 20.0–24.0)
DRAWN BY: 270211
FIO2: 0.5
LHR: 30 {breaths}/min
MECHVT: 340 mL
O2 Saturation: 95 %
PCO2 ART: 58 mmHg — AB (ref 35.0–45.0)
PEEP: 8 cmH2O
Patient temperature: 98.6
TCO2: 25.5 mmol/L (ref 0–100)
pH, Arterial: 7.316 — ABNORMAL LOW (ref 7.350–7.450)
pO2, Arterial: 94.9 mmHg (ref 80.0–100.0)

## 2015-06-20 LAB — CULTURE, BAL-QUANTITATIVE W GRAM STAIN: Gram Stain: NONE SEEN

## 2015-06-20 LAB — CBC
HEMATOCRIT: 25.3 % — AB (ref 36.0–46.0)
Hemoglobin: 7.6 g/dL — ABNORMAL LOW (ref 12.0–15.0)
MCH: 26.9 pg (ref 26.0–34.0)
MCHC: 30 g/dL (ref 30.0–36.0)
MCV: 89.4 fL (ref 78.0–100.0)
Platelets: 160 10*3/uL (ref 150–400)
RBC: 2.83 MIL/uL — ABNORMAL LOW (ref 3.87–5.11)
RDW: 18.5 % — AB (ref 11.5–15.5)
WBC: 8.3 10*3/uL (ref 4.0–10.5)

## 2015-06-20 LAB — CULTURE, BAL-QUANTITATIVE: Colony Count: 15000

## 2015-06-20 LAB — TROPONIN I: Troponin I: 0.15 ng/mL — ABNORMAL HIGH (ref <0.031)

## 2015-06-20 MED ORDER — VITAL AF 1.2 CAL PO LIQD
1000.0000 mL | ORAL | Status: DC
  Administered 2015-06-20 – 2015-06-22 (×3): 1000 mL
  Filled 2015-06-20 (×4): qty 1000

## 2015-06-20 MED ORDER — PRO-STAT SUGAR FREE PO LIQD
30.0000 mL | Freq: Every morning | ORAL | Status: DC
  Administered 2015-06-20 – 2015-06-24 (×5): 30 mL
  Filled 2015-06-20 (×5): qty 30

## 2015-06-20 MED ORDER — ADULT MULTIVITAMIN LIQUID CH
5.0000 mL | Freq: Every day | ORAL | Status: DC
  Administered 2015-06-20 – 2015-06-24 (×5): 5 mL
  Filled 2015-06-20 (×10): qty 5

## 2015-06-20 MED ORDER — FREE WATER
200.0000 mL | Freq: Three times a day (TID) | Status: DC
  Administered 2015-06-20 – 2015-06-25 (×14): 200 mL

## 2015-06-20 MED ORDER — MIDAZOLAM HCL 2 MG/2ML IJ SOLN
2.0000 mg | INTRAMUSCULAR | Status: DC | PRN
  Administered 2015-06-20 – 2015-06-22 (×15): 2 mg via INTRAVENOUS
  Filled 2015-06-20 (×18): qty 2

## 2015-06-20 MED ORDER — ADULT MULTIVITAMIN LIQUID CH: 5.0000 mL | Freq: Every day | ORAL | Status: DC

## 2015-06-20 NOTE — Progress Notes (Signed)
Sherry Craig   DOB:1946/01/28   SV#:[id number]   PQZ#:[id number]  Patient Care Team: Lester Kinsman, PA-C as PCP - General (Physician Assistant) Thea Silversmith, MD (Radiation Oncology) Jerrell Belfast, MD (Otolaryngology) Heath Lark, MD as Consulting Physician (Hematology and Oncology) Leota Sauers, RN as Oncology Nurse Navigator (Oncology) Karie Mainland, RD as Dietitian (Nutrition)  I have seen the patient, examined her and edited the notes as follows  Subjective: patient seen and examined. She remains intubated and sedated due to agitation overnight.. Other history cannot be obtained at this time due to current status.  Scheduled Meds: . antiseptic oral rinse  7 mL Mouth Rinse QID  . azithromycin  500 mg Intravenous Q24H  . chlorhexidine  15 mL Mouth Rinse BID  . enoxaparin (LOVENOX) injection  40 mg Subcutaneous Q24H  . feeding supplement (VITAL AF 1.2 CAL)  1,000 mL Per Tube Q24H  . insulin aspart  0-9 Units Subcutaneous 6 times per day  . methylPREDNISolone (SOLU-MEDROL) injection  60 mg Intravenous 3 times per day  . pantoprazole sodium  40 mg Per Tube Q1200  . piperacillin-tazobactam (ZOSYN)  IV  3.375 g Intravenous Q8H  . pravastatin  40 mg Per Tube Daily  . sodium chloride  10-40 mL Intracatheter Q12H  . vancomycin  750 mg Intravenous Q12H   Continuous Infusions: . sodium chloride 10 mL/hr at 06/20/15 0600  . sodium chloride 10 mL/hr at 06/20/15 0600  . fentaNYL infusion INTRAVENOUS 175 mcg/hr (06/20/15 0600)  . phenylephrine (NEO-SYNEPHRINE) Adult infusion 55 mcg/min (06/20/15 0600)  . propofol (DIPRIVAN) infusion 35 mcg/kg/min (06/20/15 0600)   PRN Meds:.acetaminophen **OR** acetaminophen, fentaNYL, sodium chloride  Objective:  Filed Vitals:   06/20/15 0630  BP: 100/58  Pulse:   Temp:   Resp:       Intake/Output Summary (Last 24 hours) at 06/20/15 0653 Last data filed at 06/20/15 0600  Gross per 24 hour  Intake 3107.7 ml  Output   1685 ml   Net 1422.7 ml     GENERAL: Intubated and sedated  SKIN: skin color, texture, turgor are normal, no rashes or significant lesions EYES: Closed OROPHARYNX ET tube in situ. NECK: supple, thyroid normal size, non-tender, without nodularity LYMPH:  no palpable lymphadenopathy in the cervical, axillary or inguinal LUNGS: improved aeration, trace rales bibasilarly, left greater than right, no rhonchi or wheezing. Decreased breath sounds on the right base. HEART: regular rate & rhythm and no murmurs and no lower extremity edema ABDOMEN: soft, non-tender and normal bowel sounds   CBG (last 3)   Recent Labs  06/19/15 2013 06/19/15 2355 06/20/15 0328  GLUCAP 177* 131* 152*     Labs:   Recent Labs Lab 07/12/2015 2056 06/17/15 0235 06/18/15 0336 06/20/15 0330  WBC 7.2 6.8 11.2* 8.3  HGB 8.7* 8.8* 9.4* 7.6*  HCT 27.2* 27.2* 29.8* 25.3*  PLT 254 216 231 160  MCV 86.9 86.6 87.6 89.4  MCH 27.8 28.0 27.6 26.9  MCHC 32.0 32.4 31.5 30.0  RDW 18.3* 18.2* 18.1* 18.5*  LYMPHSABS 0.9 0.8 0.5*  --   MONOABS 0.3 0.3 0.3  --   EOSABS 0.1 0.0 0.0  --   BASOSABS 0.0 0.0 0.0  --      Chemistries:    Recent Labs Lab 06/23/2015 2056 06/17/15 0235 06/18/15 0336 06/19/15 0520 06/20/15 0330  NA 133* 137 139 136 138  K 4.1 3.9 4.5 4.5 4.6  CL 100* 105 103 102 100*  CO2 '24 24 24 '$ 26  29  GLUCOSE 110* 111* 174* 169* 170*  BUN '10 8 9 12 17  '$ CREATININE 0.60 0.61 0.50 0.68 0.68  CALCIUM 9.1 8.6* 9.3 8.9 8.6*  AST 22 19  --   --   --   ALT 12* 11*  --   --   --   ALKPHOS 76 75  --   --   --   BILITOT 0.6 0.6  --   --   --     GFR Estimated Creatinine Clearance: 56.8 mL/min (by C-G formula based on Cr of 0.68).  Liver Function Tests:  Recent Labs Lab 07/01/2015 2056 06/17/15 0235  AST 22 19  ALT 12* 11*  ALKPHOS 76 75  BILITOT 0.6 0.6  PROT 7.4 6.6  ALBUMIN 3.4* 3.0*     Cardiac Enzymes:  Recent Labs Lab 06/17/15 0655  TROPONINI <0.03    Microbiology Negative to  date   Imaging Studies:  New chest x ray is pending.  Patient: Sherry Craig, Sherry Craig Collected: 06/18/2015 Client: [facility] Accession: [id number] Received: 06/18/2015 Kara Mead, MD  Diagnosis BRONCHIAL WASHING, LEFT (SPECIMEN 1 OF 1 COLLECTED 06/18/15): ATYPICAL CELLS PRESENT THAT ARE NOT DIAGNOSTIC OF MALIGNANCY. NO CELL BLOCK IS AVAILABLE FOR ADDITIONAL TESTING. Mali RUND DO Pathologist, Electronic Signature (Case signed 06/19/2015) Source Bronchial Washing, left (specimen 1 of 1 collected 06/18/15)   Assessment/Plan: 69 y.o.  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 Imaging study between the PET CT scan prior to treatment and CT scan of the chest were compared Overall, she is improving with treatment  Continue supportive care for now   Acute respiratory failure with hypoxia.  She reported increasing shortness of breath with minimal exertion and non productive cough since her last chemo. Chest X ray on 8/2 showed Stable small to moderate right pleural effusion with right base collapse/ consolidation, with possible pulmonary edema.  CT angio chest on 8/1 was negative for pulmonary embolism, similar chest x ray findings, suspicious for pneumonia. She was transferred to the SDU, and began management by CCM/ Pulmonary, with IV antibiotics and supportive therapy. Cultures were obtained, results pending. Due to the fact that she appeared to be responding to treatment on recent imaging study, aggressive care is recommended. Cytology from bronchial washing is non diagnostic Continue high dose steroids dose & weaning effort  Anemia due to antineoplastic chemotherapy This is likely due to recent treatment, current hospitalization, antibiotics, dilution.  No recent history of bleeding was noted,such as epistaxis, hematuria or hematochezia.   Current Hb 7.6 Cannot assess if patient is symptomatic as she remains intubated. May need transfusion if Hb lowers to less than 7  Protein calorie malnutrition Appreciate Nutrition involvement  DVT prophylaxis On Lovenox  Full Code Code status may be readdressed as per chart note.  Other medical issues as per admitting team  Will follow next week Call oncologist on call if questions arise   Rondel Jumbo, PA-C 06/20/2015  6:53 AM [place], Jentry Mcqueary, MD 06/20/2015

## 2015-06-20 NOTE — Progress Notes (Signed)
Patient has audible rhonchi and is attempting to sit up in the bed.  Receiving 300 mcg of fentanyl an hour and increased propofol from from 30 to 35 mcg an hour.  ET suction only produced scant amount of white sputum,  And oral some white secretions orally.  Hr rate is 40's to 80's with intermittent bigeminy.  Notified RT and they are at the patient's bedside.  Paged Marni Griffon, NP.

## 2015-06-20 NOTE — Progress Notes (Signed)
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 Currently s/p s/p cycle 4 of carboplatin and taxol (last on 7/28). Admitted on 8/2 w 3-4d h/o progressive dyspnea. CT on admit showed stable right loculated effusion and new ground glass pulmonary infiltrates. PCCM was asked to see later that day to further evaluate her dyspnea as well as comment on the right pleural effusion.   SIGNIFICANT EVENTS  8/2 PCCM consulted for worsening resp failure. We felt effusion not sig to cause sx burden and started empiric steroid trial given concern that this might be a pneumonitis process.  8/3 worsening respiratory failure over-night. Placed on BIPAP. Discussed goals of care w/ husband who agreed to DNR but wanted short term mechanical ventilation in hopes that this could be a reversible process.   STUDIES:  CT chest 8/1: 1. No CT evidence for acute pulmonary embolus.2. Right lower lobe collapse/consolidation with right pleuralthickening and right pleural effusion. Overall imaging features arenot substantially changed since the prior PET-CT.3. Mediastinal and right hilar lymphadenopathy with left lower lobe pulmonary nodule, consistent with metastatic disease.4. Interval development of interstitial and alveolar ground-glass attenuation, mainly in the left lung. Imaging features are nonspecific and differential considerations include pulmonary hemorrhage, bacterial or atypical infection, radiation pneumonitis, or lymphangitic tumor spread. Echo 8/2>>> EF 64-40%; grade 2 diastolic dysfxn. Estimated CVP 8, PAS estimated 48 mmHg   SUBJ  - afebrile Agitated on WUA No obvious pain   VITAL SIGNS: Temp:  [97.5 F (36.4 C)-98.8 F  (37.1 C)] 98.2 F (36.8 C) (08/05 0800) Pulse Rate:  [46-78] 52 (08/05 0900) Resp:  [10-36] 21 (08/05 0900) BP: (87-117)/(41-72) 92/60 mmHg (08/05 0900) SpO2:  [88 %-100 %] 98 % (08/05 0900) FiO2 (%):  [40 %] 40 % (08/05 0900) Weight:  [128 lb 12 oz (58.4 kg)] 128 lb 12 oz (58.4 kg) (08/05 0500)   PHYSICAL EXAMINATION: General:  Frail 69 year old female, intubated Neuro:  RASS-1, no focal def  HEENT:  NCAT, mild jugular distention, oral ETT Cardiovascular:  Tachy rrr Lungs:  Crackles rt bases, clear left Abdomen:  Soft, non-tender, + bowel sounds  Musculoskeletal:  Intact  Skin:  Intact   CBC Recent Labs     06/18/15  0336  06/20/15  0330  WBC  11.2*  8.3  HGB  9.4*  7.6*  HCT  29.8*  25.3*  PLT  231  160    Coag's No results for input(s): APTT, INR in the last 72 hours.  BMET Recent Labs     06/18/15  0336  06/19/15  0520  06/20/15  0330  NA  139  136  138  K  4.5  4.5  4.6  CL  103  102  100*  CO2  '24  26  29  '$ BUN  '9  12  17  '$ CREATININE  0.50  0.68  0.68  GLUCOSE  174*  169*  170*    Electrolytes Recent Labs     06/18/15  0336  06/19/15  0520  06/20/15  0330  CALCIUM  9.3  8.9  8.6*    Sepsis Markers Recent Labs     06/19/15  0520  PROCALCITON  1.62  ABG Recent Labs     06/18/15  1600  06/18/15  1706  06/19/15  0412  PHART  7.307*  7.334*  7.355  PCO2ART  50.2*  47.8*  47.0*  PO2ART  53.2*  99.2  68.6*    Liver Enzymes No results for input(s): AST, ALT, ALKPHOS, BILITOT, ALBUMIN in the last 72 hours.  Cardiac Enzymes No results for input(s): TROPONINI, PROBNP in the last 72 hours.  Glucose Recent Labs     06/19/15  1126  06/19/15  1628  06/19/15  2013  06/19/15  2355  06/20/15  0328  06/20/15  0759  GLUCAP  152*  179*  177*  131*  152*  100*    Imaging Dg Abd 1 View  06/18/2015   CLINICAL DATA:  Orogastric tube placement  EXAM: ABDOMEN - 1 VIEW  COMPARISON:  None.  FINDINGS: Orogastric tube tip and side port are in  the stomach. The bowel gas pattern is unremarkable. No obstruction or free air. There is moderate stool in the colon.  IMPRESSION: Orogastric tube tip and side port in stomach. Overall bowel gas pattern unremarkable.   Electronically Signed   By: Lowella Grip III M.D.   On: 06/18/2015 11:43   Dg Chest Port 1 View  06/20/2015   CLINICAL DATA:  Acute respiratory failure. History of lung carcinoma. Hypoxia.  EXAM: PORTABLE CHEST - 1 VIEW  COMPARISON:  June 18, 2015  FINDINGS: Endotracheal tube tip is 1.4 cm above the carina. Port-A-Cath tip is in the superior cava. Nasogastric tube tip and side port are below the diaphragm. No pneumothorax. There is a persistent right pleural effusion. There is less interstitial and alveolar edema compared to 2 days prior. There is patchy airspace consolidation in both lung bases, stable. No new opacity. Heart size and pulmonary vascularity are within normal limits. No adenopathy. No bone lesions.  IMPRESSION: Overall less edema compared to 2 days prior. Patchy airspace consolidation in both lower lobes remain stable. Loculated right effusion is stable. No new opacity. Tube and catheter positions as described without pneumothorax.   Electronically Signed   By: Lowella Grip III M.D.   On: 06/20/2015 07:17   Dg Chest Port 1 View  06/18/2015   CLINICAL DATA:  Endotracheal tube placement  EXAM: PORTABLE CHEST - 1 VIEW  COMPARISON:  06/18/2015  FINDINGS: Cardiomediastinal silhouette is stable. NG tube in place. Stable left Port-A-Cath position. Persistent loculated right pleural effusion with right lower lobe consolidation. Diffuse airspace disease left lung again noted. Endotracheal tube in place with tip 2 cm above the carina. There is no pneumothorax.  IMPRESSION: Endotracheal and NG tube in place. No pneumothorax. Stable left Port-A-Cath position. Diffuse airspace is left lung again noted. Stable loculated right pleural effusion with right lower lobe consolidation.    Electronically Signed   By: Lahoma Crocker M.D.   On: 06/18/2015 10:19        ASSESSMENT / PLAN:  PULMONARY A: Acute hypoxic and Hypercarbic respiratory failure (room air sats 86% on admit) in the setting of diffuse ground glass pulmonary infiltrates L>R: d-dx of concern primarily chemo-induced pneumonitis vs infectious (either bacterial or viral) , not typical for lymphangitic spread,doubt edema. Not sure that this is a reversible process; but oncology felt had been stable from a cancer stand-point.  Right malignant pleural effusion: this has not changed significantly radiographically and is would not explain the level of symptom burden she is experiencing.  BAL 8/3-showed atypical cells P: continue steroid  trial for pneumonitis (started 8/2) ARDS protocol , lower RR to 20 Start SBTs once off pressors   CARDIOVASCULAR  A:  SIRS. R/o sepsis.  Lactic acid on presentation was neg. PCT was neg. 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  P: Cont LMWH Trend cbc and transfuse per usual ICU guidelines Per heme/onc; although if Resp viral panel is negative and pt improves w/ steroids may need to reconsider chemo-therapy regimen.   ENDOCRINE A: Hyperglycemia  P: ssi  Neuro: A: acute encephalopathy. MS has worsened. Almost certainly as a consequence of her hypercarbia and hypoxia  P: PAD protocol -fent gtt + low dose propofol RAS goal -1 Supportive care  INFECTIOUS DISEASE A: R/o PNA viral vs bacterial. See above pulm section  P: F/u RVP 8/2 (swab)>>>neg RVP 8/3 (BAL ) >> BAL 8/3>>>ng BCX2 8/1>>>ng UC 8/1>>>ng vanc 8/1>>> 8/5 Zosyn 8/1>>> azithro  8/3>>>8/7 (planned)   Family - updated husband & daughter 8/4    Summary  Best hope here is that this is a PNA or pneumonitis. More inclined to think pneumonitis BUT lymphangitic  spread remains a possibility.Plan is to trial short term intubation in hopes that this may yet be a steroid responsive process. Husband and pt do not want prolonged care.  She will be limited code -no CPR / no CV based on our discussion but will cont current aggressive care.   The patient is critically ill with multiple organ systems failure and requires high complexity decision making for assessment and support, frequent evaluation and titration of therapies, application of advanced monitoring technologies and extensive interpretation of multiple databases. Critical Care Time devoted to patient care services described in this note independent of APP time is 32 minutes.   Kara Mead MD. [facility]. [facility] Pager [phone] If no response call [phone]     06/20/2015, 9:53 AM

## 2015-06-20 NOTE — Progress Notes (Addendum)
Nutrition Follow-up  DOCUMENTATION CODES:   Non-severe (moderate) malnutrition in context of chronic illness  INTERVENTION:  - Will adjust TF regimen: Vital AF 1.2  @ 35 mL/hr with 30 mL Prostat once/day and 200 mL free water TID to provide 1398 kcal (including kcal from Propofol), 78 grams protein, and  1281 mL free water - Will order multivitamin as TF rate will not meet micronutrient needs - RD will continue to monitor for needs  NUTRITION DIAGNOSIS:   Malnutrition related to chronic illness as evidenced by percent weight loss, moderate depletion of body fat, moderate depletions of muscle mass. 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 Currently s/p s/p cycle 4 of carboplatin and taxol (last on 7/28). Admitted on 8/2 w 3-4d h/o progressive dyspnea.   8/5 Patient is currently intubated on ventilator support MV: 11.6 L/min Temp (24hrs), Avg:97.9 F (36.6 C), Min:97.5 F (36.4 C), Max:98.8 F (37.1 C)  Propofol: 11 ml/hr (290 kcal)  Pt currently receiving Vital AF 1.2 @ 40 mL/hr which is providing 1152 kcal, 72 grams protein, and 778 mL free water; needs being met with this and kcal from Propofol. Will change TF as outlined above to better meet needs.  Drips: Propofol @ 11 mL/hr, Neosynephrine @ 55 mcg/hr. Medications reviewed. Labs reviewed; CBGs: 100-177 mg/dL, Cl: 100 mmol/L, Ca: 8.6 mg/dL.   8/3 - New consult for RD to initiate and manage TF.  - MV: 10.1 L/min - Propofol: 3.2 ml/hr (84 kcal) - Pt was transferred to ICU this AM and intubated at 0950. - OGT is currently in place and TF not yet initiated  Drips: Fentanyl @ 75 mcg/hr, Propofol @ 3.2 mL/hr, Neosynephrine @ 50 mcg/hr.  8/2 - Last seen by [place] RD 05/19/15 - Ate  50% breakfast and RD visualized 75% completion of lunch - Pt drinking Ensure PTA and here - Per weight history documentation, pt has lost 19 lb since 1/28 (14% weight loss x 6 months, significant for time frame).   Diet Order:  TF  Skin:  Reviewed, no issues  Last BM:  8/1  Height:   Ht Readings from Last 1 Encounters:  06/18/15 $RemoveB'5\' 1"'CQjdMLTS$  (1.549 m)    Weight:   Wt Readings from Last 1 Encounters:  06/20/15 128 lb 12 oz (58.4 kg)    Ideal Body Weight:  45 kg  BMI:  Body mass index is 24.34 kg/(m^2).  Estimated Nutritional Needs:   Kcal:  1366  Protein:  70-88 grams  Fluid:  1.7L/day  EDUCATION NEEDS:   No education needs identified at this time     Jarome Matin, RD, LDN Inpatient Clinical Dietitian Pager # [phone] After hours/weekend pager # [phone]

## 2015-06-20 NOTE — Progress Notes (Signed)
Date:  June 20, 2015 U.R. performed for needs and level of care. Will continue to follow for Case Management needs.  Velva Harman, RN, BSN, [place]   [phone]

## 2015-06-21 ENCOUNTER — Inpatient Hospital Stay (HOSPITAL_COMMUNITY): Payer: Medicare Other

## 2015-06-21 LAB — GLUCOSE, CAPILLARY
GLUCOSE-CAPILLARY: 149 mg/dL — AB (ref 65–99)
GLUCOSE-CAPILLARY: 114 mg/dL — AB (ref 65–99)
GLUCOSE-CAPILLARY: 131 mg/dL — AB (ref 65–99)
Glucose-Capillary: 162 mg/dL — ABNORMAL HIGH (ref 65–99)
Glucose-Capillary: 106 mg/dL — ABNORMAL HIGH (ref 65–99)
Glucose-Capillary: 123 mg/dL — ABNORMAL HIGH (ref 65–99)

## 2015-06-21 LAB — BLOOD GAS, ARTERIAL
ACID-BASE EXCESS: 6.8 mmol/L — AB (ref 0.0–2.0)
BICARBONATE: 31.1 meq/L — AB (ref 20.0–24.0)
Drawn by: 103701
FIO2: 0.4
O2 Saturation: 99.2 %
PATIENT TEMPERATURE: 98.6
PEEP/CPAP: 5 cmH2O
PO2 ART: 158 mmHg — AB (ref 80.0–100.0)
RATE: 30 resp/min
TCO2: 29.3 mmol/L (ref 0–100)
VT: 340 mL
pCO2 arterial: 45.4 mmHg — ABNORMAL HIGH (ref 35.0–45.0)
pH, Arterial: 7.45 (ref 7.350–7.450)

## 2015-06-21 LAB — CBC
HEMATOCRIT: 23.9 % — AB (ref 36.0–46.0)
HEMOGLOBIN: 7.2 g/dL — AB (ref 12.0–15.0)
MCH: 27.2 pg (ref 26.0–34.0)
MCHC: 30.1 g/dL (ref 30.0–36.0)
MCV: 90.2 fL (ref 78.0–100.0)
Platelets: 145 10*3/uL — ABNORMAL LOW (ref 150–400)
RBC: 2.65 MIL/uL — ABNORMAL LOW (ref 3.87–5.11)
RDW: 19.2 % — AB (ref 11.5–15.5)
WBC: 8.8 10*3/uL (ref 4.0–10.5)

## 2015-06-21 LAB — RENAL FUNCTION PANEL
Albumin: 2.2 g/dL — ABNORMAL LOW (ref 3.5–5.0)
Anion gap: 7 (ref 5–15)
BUN: 20 mg/dL (ref 6–20)
CALCIUM: 8.4 mg/dL — AB (ref 8.9–10.3)
CHLORIDE: 99 mmol/L — AB (ref 101–111)
CO2: 34 mmol/L — AB (ref 22–32)
Creatinine, Ser: 0.59 mg/dL (ref 0.44–1.00)
GFR calc Af Amer: >60 mL/min (ref >60)
GFR calc non Af Amer: >60 mL/min (ref >60)
Glucose, Bld: 134 mg/dL — ABNORMAL HIGH (ref 65–99)
PHOSPHORUS: 2.7 mg/dL (ref 2.5–4.6)
Potassium: 4.5 mmol/L (ref 3.5–5.1)
SODIUM: 140 mmol/L (ref 135–145)

## 2015-06-21 LAB — MAGNESIUM: MAGNESIUM: 2.2 mg/dL (ref 1.7–2.4)

## 2015-06-21 LAB — PROCALCITONIN: Procalcitonin: 0.55 ng/mL

## 2015-06-21 LAB — TRIGLYCERIDES: Triglycerides: 142 mg/dL (ref <150)

## 2015-06-21 NOTE — Progress Notes (Signed)
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 Currently s/p s/p cycle 4 of carboplatin and taxol (last on 7/28). Admitted on 8/2 w 3-4d h/o progressive dyspnea. CT on admit showed stable right loculated effusion and new ground glass pulmonary infiltrates. PCCM was asked to see later that day to further evaluate her dyspnea as well as comment on the right pleural effusion.   SIGNIFICANT EVENTS  8/2 PCCM consulted for worsening resp failure. We felt effusion not sig to cause sx burden and started empiric steroid trial given concern that this might be a pneumonitis process.  8/3 worsening respiratory failure over-night. Placed on BIPAP. Discussed goals of care w/ husband who agreed to DNR but wanted short term mechanical ventilation in hopes that this could be a reversible process.   STUDIES:  CT chest 8/1: 1. No CT evidence for acute pulmonary embolus.2. Right lower lobe collapse/consolidation with right pleuralthickening and right pleural effusion. Overall imaging features arenot substantially changed since the prior PET-CT.3. Mediastinal and right hilar lymphadenopathy with left lower lobe pulmonary nodule, consistent with metastatic disease.4. Interval development of interstitial and alveolar ground-glass attenuation, mainly in the left lung. Imaging features are nonspecific and differential considerations include pulmonary hemorrhage, bacterial or atypical infection, radiation pneumonitis, or lymphangitic tumor spread. Echo 8/2>>> EF 30-16%; grade 2 diastolic dysfxn. Estimated CVP 8, PAS estimated 48 mmHg   SUBJECTIVE: Patient required boluses of Versed overnight. Still having periods of bradycardia on  monitor/tele. Attempted weaning FiO2 to 0.3 but patient desaturated.  ROS:  Unobtainable as patient is intubated & sedated.  VITAL SIGNS: Temp:  [97 F (36.1 C)-98.2 F (36.8 C)] 97.7 F (36.5 C) (08/06 0800) Pulse Rate:  [44-97] 50 (08/06 1200) Resp:  [20-36] 20 (08/06 1200) BP: (84-129)/(47-89) 115/59 mmHg (08/06 1200) SpO2:  [94 %-100 %] 98 % (08/06 1200) FiO2 (%):  [40 %-50 %] 40 % (08/06 1220) Weight:  [123 lb 0.3 oz (55.8 kg)] 123 lb 0.3 oz (55.8 kg) (08/06 0400)   PHYSICAL EXAMINATION: General:  No distress. Sedate. Family at bedside. Neuro:  Sedated. PERRL. Spontaneously has been moving all 4 extremities. HEENT:  No scleral icterus. ETT in place.  Cardiovascular:  Regular rate & rhythm. No edema. Unable to appreciate JVD. Lungs:  Mildly decreased breath sounds bilateral bases. Continuing on ventilator. Symmetric chest rise. Abdomen:  Nondistended. Hypoactive bowel sounds. Soft. Musculoskeletal:  No joint effusion or deformity. Skin:  Warm & dry. Left chest port.  CBC Recent Labs     06/20/15  0330  06/21/15  0600  WBC  8.3  8.8  HGB  7.6*  7.2*  HCT  25.3*  23.9*  PLT  160  145*    Coag's No results for input(s): APTT, INR in the last 72 hours.  BMET Recent Labs     06/19/15  0520  06/20/15  0330  NA  136  138  K  4.5  4.6  CL  102  100*  CO2  26  29  BUN  12  17  CREATININE  0.68  0.68  GLUCOSE  169*  170*    Electrolytes Recent Labs     06/19/15  0520  06/20/15  0330  CALCIUM  8.9  8.6*    Sepsis Markers Recent Labs     06/19/15  0520  06/21/15  0600  PROCALCITON  1.62  0.55    ABG Recent Labs     06/18/15  1706  06/19/15  0412  06/20/15  1220  PHART  7.334*  7.355  7.316*  PCO2ART  47.8*  47.0*  58.0*  PO2ART  99.2  68.6*  94.9    Liver Enzymes No results for input(s): AST, ALT, ALKPHOS, BILITOT, ALBUMIN in the last 72 hours.  Cardiac Enzymes Recent Labs     06/20/15  1156  TROPONINI  0.15*    Glucose Recent Labs       06/20/15  1616  06/20/15  2013  06/21/15  0106  06/21/15  0429  06/21/15  0845  06/21/15  1139  GLUCAP  119*  129*  162*  149*  106*  114*    Imaging Dg Chest Port 1 View  06/21/2015   CLINICAL DATA:  Acute respiratory failure.  History of lung cancer.  EXAM: PORTABLE CHEST - 1 VIEW  COMPARISON:  06/20/2015 and 06/18/2015.  FINDINGS: 0527 hours. The endotracheal tube, left-sided Port-A-Cath and nasogastric tube are unchanged. Right pleural effusion appears unchanged, potentially partially loculated. There is stable right basilar airspace disease. No definite residual edema or pneumothorax. The bones appear unchanged.  IMPRESSION: Stable right pleural effusion and adjacent right basilar airspace disease. Stable support system.   Electronically Signed   By: Richardean Sale M.D.   On: 06/21/2015 09:12   Dg Chest Port 1 View  06/20/2015   CLINICAL DATA:  Acute respiratory failure. History of lung carcinoma. Hypoxia.  EXAM: PORTABLE CHEST - 1 VIEW  COMPARISON:  June 18, 2015  FINDINGS: Endotracheal tube tip is 1.4 cm above the carina. Port-A-Cath tip is in the superior cava. Nasogastric tube tip and side port are below the diaphragm. No pneumothorax. There is a persistent right pleural effusion. There is less interstitial and alveolar edema compared to 2 days prior. There is patchy airspace consolidation in both lung bases, stable. No new opacity. Heart size and pulmonary vascularity are within normal limits. No adenopathy. No bone lesions.  IMPRESSION: Overall less edema compared to 2 days prior. Patchy airspace consolidation in both lower lobes remain stable. Loculated right effusion is stable. No new opacity. Tube and catheter positions as described without pneumothorax.   Electronically Signed   By: Lowella Grip III M.D.   On: 06/20/2015 07:17   Dg Abd Portable 1v  06/21/2015   CLINICAL DATA:  69 year old female with a history of gastric tube placement.  EXAM: PORTABLE ABDOMEN - 1 VIEW   COMPARISON:  06/18/2015  FINDINGS: Single image of the abdomen with exclusion of portions of the right abdomen.  Gastric tube projects over the left mid abdomen, unchanged from the prior. Side hole appears well beyond the gastroesophageal junction.  Soft tissue density of the upper abdomen is again visualized, in this patient with hepatic megaly demonstrated on prior CT imaging.  Gas within small bowel and colon. No abnormally distended small bowel or colon.  No unexpected radiopaque foreign body. No unexpected calcifications.  Degenerative changes of the spine.  IMPRESSION: Gastric tube terminates within the left mid abdomen, likely within the stomach along the greater curvature.  Soft tissue density of the mid abdomen in this patient with hepatomegaly demonstrated on prior CT imaging.  Nonobstructive bowel gas pattern.  Signed,  Dulcy Fanny. Earleen Newport, DO  [facility]  [facility]   Electronically Signed   By: Corrie Mckusick D.O.   On: 06/21/2015 12:38    ASSESSMENT / PLAN:  PULMONARY A: Acute hypoxic and Hypercarbic respiratory failure (room air sats 86% on admit) in the setting of diffuse ground glass pulmonary infiltrates L>R: d-dx of concern primarily chemo-induced pneumonitis vs infectious (either bacterial or viral) , not typical for lymphangitic spread,doubt edema. Not sure that this is a reversible process; but oncology felt had been stable from a cancer stand-point.  Right malignant pleural effusion: this has not changed significantly radiographically and is would not explain the level of symptom burden she is experiencing.  BAL 8/3-showed atypical cells  P: Solumedrol IV for pneumonitis (started 8/2) ARDS protocol  SBT when mental status allows Stat ABG  CARDIOVASCULAR  A:  Bradycardia SIRS - R/o sepsis.  Lactic acid on presentation was neg. PCT was neg. Clinical picture not compelling story for infection  P: Checking EKG Checking  electrolytes & Magnesium level stat Holding Diuresis given marginal blood pressure Tele monitoring  Renal A: No acute issues  P: Strict I&O Monitor UOP Stat Renal panel & Magnesium level Monitor renal fxn w/ daily BUN/Creatinine  GASTROINTESTINAL  A: No acute issues.  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  Thrombocytopenia  P: Continue Lovenox DVT prophylaxis Trend Hgb daily with CBC Transfuse for Hgb <7.0 Trend platelets daily w/ CBC Per heme/onc; although if Resp viral panel is negative and pt improves w/ steroids may need to reconsider chemo-therapy regimen.   ENDOCRINE A: Hyperglycemia  Solu-Medrol IV  P: SSI algothrim Accuchecks q4hr  Neuro: A:  Acute encephalopathy - Worsening. Possible Delirium  P: PAD protocol -fent gtt + low dose propofol RAS goal -1 Supportive care Consider Haldol if QTc allows  INFECTIOUS DISEASE A: R/o PNA viral vs bacterial. See above pulm section   P: Trending leukocyte count daily w/ CBC Monitoring for fever  F/u RVP 8/2 (swab)>>>neg RVP 8/3 (BAL ) >> BAL 8/3>>>ng BCX2 8/1>>>ng UC 8/1>>>ng  vanc 8/1>>> 8/5 Zosyn 8/1>>> azithro 8/3>>>8/7 (planned)   Family - Daughters & brother-in-law updated today.   TODAY'S SUMMARY:  Patient remains positive in terms of fluid balance. Pneumonia vs pneumonitis vs lymphangitic spread of tumor. Remains on minimal oxygen support but patient is having episodes of bradycardia. Requiring substantial medication for sedation. Question element of delirium. She may benefit from Haldol if her QTc will allow.  Brother-in-law and daughters updated at bedside. Awaiting her husband.  I have spent a total of 33 minutes of critical care time today caring for the patient, updating the patient's family, discussing the plan of care with the patient's RN, and  reviewing the patient's EMR.  Sonia Baller Ashok Cordia, M.D. [facility] Pager:  [phone] After 3pm or if no response, call [phone] 06/21/2015, 12:42 PM

## 2015-06-22 ENCOUNTER — Inpatient Hospital Stay (HOSPITAL_COMMUNITY): Payer: Medicare Other

## 2015-06-22 LAB — RENAL FUNCTION PANEL
ANION GAP: 10 (ref 5–15)
Albumin: 2.4 g/dL — ABNORMAL LOW (ref 3.5–5.0)
BUN: 19 mg/dL (ref 6–20)
CO2: 31 mmol/L (ref 22–32)
Calcium: 8.4 mg/dL — ABNORMAL LOW (ref 8.9–10.3)
Chloride: 102 mmol/L (ref 101–111)
Creatinine, Ser: 0.57 mg/dL (ref 0.44–1.00)
GFR calc Af Amer: >60 mL/min (ref >60)
GFR calc non Af Amer: >60 mL/min (ref >60)
GLUCOSE: 154 mg/dL — AB (ref 65–99)
Phosphorus: 3.1 mg/dL (ref 2.5–4.6)
Potassium: 4.4 mmol/L (ref 3.5–5.1)
SODIUM: 143 mmol/L (ref 135–145)

## 2015-06-22 LAB — CBC WITH DIFFERENTIAL/PLATELET
Basophils Absolute: 0 10*3/uL (ref 0.0–0.1)
Basophils Relative: 0 % (ref 0–1)
EOS PCT: 0 % (ref 0–5)
Eosinophils Absolute: 0 10*3/uL (ref 0.0–0.7)
HEMATOCRIT: 25.7 % — AB (ref 36.0–46.0)
Hemoglobin: 8 g/dL — ABNORMAL LOW (ref 12.0–15.0)
LYMPHS ABS: 0.7 10*3/uL (ref 0.7–4.0)
Lymphocytes Relative: 8 % — ABNORMAL LOW (ref 12–46)
MCH: 28.2 pg (ref 26.0–34.0)
MCHC: 31.1 g/dL (ref 30.0–36.0)
MCV: 90.5 fL (ref 78.0–100.0)
MONO ABS: 0.3 10*3/uL (ref 0.1–1.0)
MONOS PCT: 4 % (ref 3–12)
NEUTROS ABS: 7.4 10*3/uL (ref 1.7–7.7)
Neutrophils Relative %: 88 % — ABNORMAL HIGH (ref 43–77)
Platelets: 147 10*3/uL — ABNORMAL LOW (ref 150–400)
RBC: 2.84 MIL/uL — AB (ref 3.87–5.11)
RDW: 19.5 % — ABNORMAL HIGH (ref 11.5–15.5)
WBC: 8.4 10*3/uL (ref 4.0–10.5)

## 2015-06-22 LAB — GLUCOSE, CAPILLARY
GLUCOSE-CAPILLARY: 144 mg/dL — AB (ref 65–99)
GLUCOSE-CAPILLARY: 143 mg/dL — AB (ref 65–99)
GLUCOSE-CAPILLARY: 155 mg/dL — AB (ref 65–99)
Glucose-Capillary: 150 mg/dL — ABNORMAL HIGH (ref 65–99)
Glucose-Capillary: 144 mg/dL — ABNORMAL HIGH (ref 65–99)

## 2015-06-22 LAB — CULTURE, BLOOD (ROUTINE X 2)
CULTURE: NO GROWTH
Culture: NO GROWTH

## 2015-06-22 LAB — MAGNESIUM: MAGNESIUM: 2.4 mg/dL (ref 1.7–2.4)

## 2015-06-22 MED ORDER — DEXMEDETOMIDINE HCL IN NACL 400 MCG/100ML IV SOLN
2.0000 ug/kg/h | INTRAVENOUS | Status: DC
  Administered 2015-06-22: 0.8 ug/kg/h via INTRAVENOUS
  Administered 2015-06-22 (×3): 1.2 ug/kg/h via INTRAVENOUS
  Administered 2015-06-22: 0.4 ug/kg/h via INTRAVENOUS
  Administered 2015-06-23: 0.8 ug/kg/h via INTRAVENOUS
  Administered 2015-06-23 (×2): 2 ug/kg/h via INTRAVENOUS
  Administered 2015-06-23: 0.9 ug/kg/h via INTRAVENOUS
  Administered 2015-06-23: 2 ug/kg/h via INTRAVENOUS
  Administered 2015-06-23: 0.7 ug/kg/h via INTRAVENOUS
  Administered 2015-06-23 – 2015-06-24 (×2): 2 ug/kg/h via INTRAVENOUS
  Administered 2015-06-24: 1.5 ug/kg/h via INTRAVENOUS
  Administered 2015-06-24 (×2): 2 ug/kg/h via INTRAVENOUS
  Administered 2015-06-24: 1.5 ug/kg/h via INTRAVENOUS
  Administered 2015-06-24: 2 ug/kg/h via INTRAVENOUS
  Administered 2015-06-25: 1.5 ug/kg/h via INTRAVENOUS
  Filled 2015-06-22 (×4): qty 100
  Filled 2015-06-22 (×2): qty 50
  Filled 2015-06-22 (×3): qty 100
  Filled 2015-06-22: qty 50
  Filled 2015-06-22: qty 100
  Filled 2015-06-22 (×3): qty 50
  Filled 2015-06-22: qty 100
  Filled 2015-06-22: qty 50
  Filled 2015-06-22 (×5): qty 100
  Filled 2015-06-22 (×2): qty 50

## 2015-06-22 MED ORDER — SODIUM CHLORIDE 0.9 % IV BOLUS (SEPSIS)
250.0000 mL | Freq: Once | INTRAVENOUS | Status: AC
  Administered 2015-06-22: 250 mL via INTRAVENOUS

## 2015-06-22 MED ORDER — MIDAZOLAM BOLUS VIA INFUSION
2.0000 mg | INTRAVENOUS | Status: DC | PRN
  Administered 2015-06-22: 2 mg via INTRAVENOUS
  Administered 2015-06-22 – 2015-06-24 (×3): 3 mg via INTRAVENOUS
  Administered 2015-06-25: 1 mg via INTRAVENOUS
  Filled 2015-06-22 (×6): qty 4

## 2015-06-22 MED ORDER — SODIUM CHLORIDE 0.9 % IJ SOLN: 10.0000 mL | INTRAMUSCULAR | Status: DC | PRN

## 2015-06-22 MED ORDER — SODIUM CHLORIDE 0.9 % IJ SOLN
10.0000 mL | Freq: Two times a day (BID) | INTRAMUSCULAR | Status: DC
  Administered 2015-06-22: 30 mL
  Administered 2015-06-23 – 2015-06-24 (×4): 10 mL

## 2015-06-22 MED ORDER — HALOPERIDOL LACTATE 5 MG/ML IJ SOLN
2.0000 mg | Freq: Four times a day (QID) | INTRAMUSCULAR | Status: DC | PRN
  Administered 2015-06-22: 2 mg via INTRAVENOUS
  Filled 2015-06-22 (×2): qty 1

## 2015-06-22 MED ORDER — SODIUM CHLORIDE 0.9 % IV SOLN
1.0000 mg/h | INTRAVENOUS | Status: DC
  Administered 2015-06-22: 2 mg/h via INTRAVENOUS
  Administered 2015-06-22: 1 mg/h via INTRAVENOUS
  Administered 2015-06-22: 3 mg/h via INTRAVENOUS
  Administered 2015-06-23: 5 mg/h via INTRAVENOUS
  Administered 2015-06-23: 2 mg/h via INTRAVENOUS
  Administered 2015-06-23: 5 mg/h via INTRAVENOUS
  Administered 2015-06-23: 0.5 mg/h via INTRAVENOUS
  Administered 2015-06-24: 5 mg/h via INTRAVENOUS
  Administered 2015-06-24: 1 mg/h via INTRAVENOUS
  Administered 2015-06-25: 2 mg/h via INTRAVENOUS
  Administered 2015-06-25: 5 mg/h via INTRAVENOUS
  Administered 2015-06-25: 1 mg/h via INTRAVENOUS
  Filled 2015-06-22 (×8): qty 10

## 2015-06-22 NOTE — Progress Notes (Signed)
Bedside consult with Dr.Nester and RT to achieve  vent synchrony. On Versed at '2mg'$ / Fentanyl at 432mg, and Precedex at 1.243m. To achieve this. Will closely monitor VS and RASS and adjust as needed.

## 2015-06-22 NOTE — Progress Notes (Signed)
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 Currently s/p s/p cycle 4 of carboplatin and taxol (last on 7/28). Admitted on 8/2 w 3-4d h/o progressive dyspnea. CT on admit showed stable right loculated effusion and new ground glass pulmonary infiltrates. PCCM was asked to see later that day to further evaluate her dyspnea as well as comment on the right pleural effusion.   SIGNIFICANT EVENTS  8/2 PCCM consulted for worsening resp failure. We felt effusion not sig to cause sx burden and started empiric steroid trial given concern that this might be a pneumonitis process.  8/3 worsening respiratory failure over-night. Placed on BIPAP. Discussed goals of care w/ husband who agreed to DNR but wanted short term mechanical ventilation in hopes that this could be a reversible process.  8/3 intubation & bronchoscopy  STUDIES:  CT chest 8/1: 1. No CT evidence for acute pulmonary embolus.2. Right lower lobe collapse/consolidation with right pleuralthickening and right pleural effusion. Overall imaging features arenot substantially changed since the prior PET-CT.3. Mediastinal and right hilar lymphadenopathy with left lower lobe pulmonary nodule, consistent with metastatic disease.4. Interval development of interstitial and alveolar ground-glass attenuation, mainly in the left lung. Imaging features are nonspecific and differential considerations include pulmonary hemorrhage, bacterial or atypical infection, radiation pneumonitis, or lymphangitic tumor spread. Echo 8/2>>> EF 73-22%; grade 2 diastolic dysfxn. Estimated CVP 8, PAS estimated 48 mmHg   SUBJECTIVE: Patient still intubated. Requiring vasopressor with heavy  sedation. Still with altered mentation but following commands per RN when sedation held.  ROS:  Unobtainable as patient is intubated & sedated.  VITAL SIGNS: Temp:  [96.3 F (35.7 C)-97.6 F (36.4 C)] 97.4 F (36.3 C) (08/07 0800) Pulse Rate:  [45-99] 93 (08/07 0820) Resp:  [14-34] 34 (08/07 0820) BP: (64-157)/(25-82) 101/60 mmHg (08/07 0800) SpO2:  [92 %-100 %] 92 % (08/07 0820) FiO2 (%):  [35 %-40 %] 35 % (08/07 0832) Weight:  [131 lb 2.8 oz (59.5 kg)] 131 lb 2.8 oz (59.5 kg) (08/07 0400)   PHYSICAL EXAMINATION: General:  No distress. Will open eyes. Sedated. Neuro:  Sedated. PERRL. Spontaneously has been moving all 4 extremities. HEENT:  No scleral icterus. ETT in place.  Cardiovascular:  Regular rate & sinus rhythm. No edema.  Lungs:  Mildly decreased breath sounds bilateral bases. Continuing on ventilator. Symmetric chest rise. Abdomen:  Nondistended. Hypoactive bowel sounds. Soft. Musculoskeletal:  No joint effusion or deformity. Skin:  Warm & dry. Left chest port.  CBC Recent Labs     06/20/15  0330  06/21/15  0600  06/22/15  0411  WBC  8.3  8.8  8.4  HGB  7.6*  7.2*  8.0*  HCT  25.3*  23.9*  25.7*  PLT  160  145*  147*    Coag's No results for input(s): APTT, INR in the last 72 hours.  BMET Recent Labs     06/20/15  0330  06/21/15  1530  06/22/15  0411  NA  138  140  143  K  4.6  4.5  4.4  CL  100*  99*  102  CO2  29  34*  31  BUN  '17  20  19  '$ CREATININE  0.68  0.59  0.57  GLUCOSE  170*  134*  154*    Electrolytes Recent Labs     06/20/15  0330  06/21/15  1530  06/22/15  0411  CALCIUM  8.6*  8.4*  8.4*  MG   --   2.2  2.4  PHOS   --   2.7  3.1    Sepsis Markers Recent Labs     06/21/15  0600  PROCALCITON  0.55    ABG Recent Labs     06/20/15  1220  06/21/15  1307  PHART  7.316*  7.450  PCO2ART  58.0*  45.4*  PO2ART  94.9  158*    Liver Enzymes Recent Labs     06/21/15  1530  06/22/15  0411  ALBUMIN  2.2*  2.4*     Cardiac Enzymes Recent Labs     06/20/15  1156  TROPONINI  0.15*    Glucose Recent Labs     06/21/15  0106  06/21/15  0429  06/21/15  0845  06/21/15  1139  06/21/15  1607  06/21/15  2006  GLUCAP  162*  149*  106*  114*  131*  123*    Imaging Dg Chest Port 1 View  06/21/2015   CLINICAL DATA:  Acute respiratory failure.  History of lung cancer.  EXAM: PORTABLE CHEST - 1 VIEW  COMPARISON:  06/20/2015 and 06/18/2015.  FINDINGS: 0527 hours. The endotracheal tube, left-sided Port-A-Cath and nasogastric tube are unchanged. Right pleural effusion appears unchanged, potentially partially loculated. There is stable right basilar airspace disease. No definite residual edema or pneumothorax. The bones appear unchanged.  IMPRESSION: Stable right pleural effusion and adjacent right basilar airspace disease. Stable support system.   Electronically Signed   By: Richardean Sale M.D.   On: 06/21/2015 09:12   Dg Abd Portable 1v  06/21/2015   CLINICAL DATA:  69 year old female with a history of gastric tube placement.  EXAM: PORTABLE ABDOMEN - 1 VIEW  COMPARISON:  06/18/2015  FINDINGS: Single image of the abdomen with exclusion of portions of the right abdomen.  Gastric tube projects over the left mid abdomen, unchanged from the prior. Side hole appears well beyond the gastroesophageal junction.  Soft tissue density of the upper abdomen is again visualized, in this patient with hepatic megaly demonstrated on prior CT imaging.  Gas within small bowel and colon. No abnormally distended small bowel or colon.  No unexpected radiopaque foreign body. No unexpected calcifications.  Degenerative changes of the spine.  IMPRESSION: Gastric tube terminates within the left mid abdomen, likely within the stomach along the greater curvature.  Soft tissue density of the mid abdomen in this patient with hepatomegaly demonstrated on prior CT imaging.  Nonobstructive bowel gas pattern.  Signed,  Dulcy Fanny. Earleen Newport, DO  [facility]  [facility]   Electronically Signed   By: Corrie Mckusick D.O.   On: 06/21/2015 12:38    ASSESSMENT / PLAN:  PULMONARY OETT 8/3>> A: Acute hypoxic and Hypercarbic respiratory failure (room air sats 86% on admit) in the setting of diffuse ground glass pulmonary infiltrates L>R: d-dx of concern primarily chemo-induced pneumonitis vs infectious (either bacterial or viral) , not typical for lymphangitic spread,doubt edema. Not sure that this is a reversible process; but oncology felt had been stable from a cancer stand-point.  Right malignant pleural effusion: this has not changed significantly radiographically and is would not explain the level of symptom burden she is experiencing.  BAL  8/3-showed atypical cells  P: Solumedrol IV for pneumonitis (started 8/2) ARDS protocol  SBT when mental status allows Requiring high MV   CARDIOVASCULAR  Port A:  Bradycardia - Related to sedation SIRS - R/o sepsis.  Lactic acid on presentation was neg. PCT was neg. Clinical picture not compelling story for infection  P: Monitoring electrolytes daily Holding Diuresis given marginal blood pressure Tele monitoring Neo to maintain MAP >65 PICC consult for access  Renal A: No acute issues  P: Strict I&O Monitor UOP Electrolytes daily Monitor renal fxn w/ daily BUN/Creatinine  GASTROINTESTINAL  A: No acute issues.  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  Thrombocytopenia  P: Continue Lovenox DVT prophylaxis Trend Hgb daily with CBC Transfuse for Hgb <7.0 Trend platelets daily w/ CBC Per heme/onc; although if Resp viral panel is negative and pt improves w/ steroids may need to reconsider chemo-therapy regimen.   ENDOCRINE A: Hyperglycemia  Solu-Medrol IV  P: SSI algothrim Accuchecks  q4hr  Neuro: A:  Acute encephalopathy - Worsening. Possible Delirium  P: Starting Precedex gtt w/o bolus Weaning Propofol once Precedex started Fentanyl gtt Versed IV prn RAS goal 0 to -1 Consider Haldol if QTc allows  INFECTIOUS DISEASE A: R/o PNA viral vs bacterial. See above pulm section   P: Trending leukocyte count daily w/ CBC Monitoring for fever  F/u RVP 8/2 (swab)>>>neg BAL 8/3>>>ng BCX2 8/1>>>ng UC 8/1>>>ng  vanc 8/1>>> 8/5 Zosyn 8/1>>> azithro 8/3>>>8/7 (planned)   Family - Daughter updated at bedside.   TODAY'S SUMMARY:  Remains on vasopressor support secondary to sedation. Patient needs diuresis. Attempting Precedex for sedation given effects of Propofol on heart rate. If she weans off of Neo plan for gentle diuresis.  I have spent a total of 31 minutes of critical care time today caring for the patient, updating the patient's daughter, discussing the plan of care with the patient's RN, and reviewing the patient's EMR.  Sonia Baller Ashok Cordia, M.D. [facility] Pager:  [phone] After 3pm or if no response, call [phone] 06/22/2015, 8:38 AM

## 2015-06-22 NOTE — Progress Notes (Signed)
ANTIBIOTIC CONSULT NOTE - FOLLOW UP  Pharmacy Consult for Zosyn Indication: HCAP  No Known Allergies  Patient Measurements: Height: '5\' 1"'$  (154.9 cm) Weight: 131 lb 2.8 oz (59.5 kg) IBW/kg (Calculated) : 47.8  Vital Signs: Temp: 97.4 F (36.3 C) (08/07 0800) Temp Source: Axillary (08/07 0800) BP: 132/90 mmHg (08/07 1300) Pulse Rate: 92 (08/07 1300) Intake/Output from previous day: 08/06 0701 - 08/07 0700 In: 2951.1 [I.V.:1766.1; NG/GT:1035; IV Piggyback:150] Out: 1900 [Urine:1900] Intake/Output from this shift: Total I/O In: 889.3 [I.V.:434.3; NG/GT:405; IV Piggyback:50] Out: -   Labs:  Recent Labs  06/20/15 0330 06/21/15 0600 06/21/15 1530 06/22/15 0411  WBC 8.3 8.8  --  8.4  HGB 7.6* 7.2*  --  8.0*  PLT 160 145*  --  147*  CREATININE 0.68  --  0.59 0.57   Estimated Creatinine Clearance: 57.3 mL/min (by C-G formula based on Cr of 0.57). No results for input(s): VANCOTROUGH, VANCOPEAK, VANCORANDOM, GENTTROUGH, GENTPEAK, GENTRANDOM, TOBRATROUGH, TOBRAPEAK, TOBRARND, AMIKACINPEAK, AMIKACINTROU, AMIKACIN in the last 72 hours.   Microbiology: Recent Results (from the past 720 hour(s))  Culture, blood (routine x 2)     Status: None   Collection Time: 06/22/2015  9:04 PM  Result Value Ref Range Status   Specimen Description BLOOD LEFT ANTECUBITAL  Final   Special Requests BOTTLES DRAWN AEROBIC AND ANAEROBIC 5ML  Final   Culture   Final    NO GROWTH 5 DAYS Performed at [facility]    Report Status 06/22/2015 FINAL  Final  Culture, blood (routine x 2)     Status: None   Collection Time: 07/15/2015  9:30 PM  Result Value Ref Range Status   Specimen Description BLOOD RIGHT ANTECUBITAL  Final   Special Requests BOTTLES DRAWN AEROBIC AND ANAEROBIC 4CC  Final   Culture   Final    NO GROWTH 5 DAYS Performed at [facility]    Report Status 06/22/2015 FINAL  Final  Urine culture     Status: None   Collection Time: 07/13/2015  9:52 PM  Result Value Ref Range  Status   Specimen Description URINE, CLEAN CATCH  Final   Special Requests NONE  Final   Culture   Final    NO GROWTH 1 DAY Performed at [facility]    Report Status 06/18/2015 FINAL  Final  MRSA PCR Screening     Status: None   Collection Time: 06/17/15 12:31 AM  Result Value Ref Range Status   MRSA by PCR NEGATIVE NEGATIVE Final    Comment:        The GeneXpert MRSA Assay (FDA approved for NASAL specimens only), is one component of a comprehensive MRSA colonization surveillance program. It is not intended to diagnose MRSA infection nor to guide or monitor treatment for MRSA infections.   Respiratory virus panel     Status: None   Collection Time: 06/17/15 12:40 PM  Result Value Ref Range Status   Respiratory Syncytial Virus A Negative Negative Final   Respiratory Syncytial Virus B Negative Negative Final   Influenza A Negative Negative Final   Influenza B Negative Negative Final   Parainfluenza 1 Negative Negative Final   Parainfluenza 2 Negative Negative Final   Parainfluenza 3 Negative Negative Final   Metapneumovirus Negative Negative Final   Rhinovirus Negative Negative Final   Adenovirus Negative Negative Final    Comment: (NOTE) Performed At: [facility] [address] [place], [place] [id number] Lindon Romp MD RF:[id number]   AFB culture  with smear     Status: None (Preliminary result)   Collection Time: 06/18/15 11:01 AM  Result Value Ref Range Status   Specimen Description BRONCHIAL ALVEOLAR LAVAGE  Final   Special Requests Immunocompromised  Final   Acid Fast Smear   Final    NO ACID FAST BACILLI SEEN Performed at [facility]    Culture   Final    CULTURE WILL BE EXAMINED FOR 6 WEEKS BEFORE ISSUING A FINAL REPORT Performed at [facility]    Report Status PENDING  Incomplete  Culture, bal-quantitative     Status: None   Collection Time: 06/18/15 11:01 AM  Result Value Ref Range Status   Specimen Description  BRONCHIAL ALVEOLAR LAVAGE  Final   Special Requests Immunocompromised  Final   Gram Stain   Final    NO WBC SEEN NO SQUAMOUS EPITHELIAL CELLS SEEN NO ORGANISMS SEEN Performed at [facility]   Final    15,000 COLONIES/ML Performed at [facility]    Culture   Final    YEAST CONSISTENT WITH CANDIDA SPECIES Performed at [facility]    Report Status 06/20/2015 FINAL  Final  Fungus Culture with Smear     Status: None (Preliminary result)   Collection Time: 06/18/15 11:01 AM  Result Value Ref Range Status   Specimen Description BRONCHIAL ALVEOLAR LAVAGE  Final   Special Requests Immunocompromised  Final   Fungal Smear RARE YEAST Performed at [facility]   Final   Culture   Final    CANDIDA ALBICANS Performed at [facility]    Report Status PENDING  Incomplete     Assessment: 60 yoF admitted on 8/1 with dyspnea, pulmonary infiltrates and effusion. 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 CT shows loculated effusion and new infiltrates. Pharmacy was consulted to dose Vancomycin and Zosyn for HCAP. On 8/3 her respiratory status worsened and she required intubation; MD added azithromycin for atypical coverage. Vancomycin stopped on 8/5.  8/2 >> zosyn>> 8/2 >> vancomycin>> 8/5  8/3 >> azithromycin (per MD dosing) >>  Afebrile WBC WNL SCr 0.57 with CrCl ~ 57 ml/min CG  Goal of Therapy:  Appropriate antibiotic dosing for renal function and indication Eradication of infection  Plan:  Day #6 Zosyn, Day #5 Azithromycin  Continue Zosyn 3.375g IV q8h (infuse over 4 hours)  Monitor renal function, cultures, duration of therapy, clinical course.  Lindell Spar, PharmD, BCPS Pager: [phone] 06/22/2015 5:50 PM

## 2015-06-22 NOTE — Progress Notes (Signed)
Peripherally Inserted Central Catheter/Midline Placement  The IV Nurse has discussed with the patient and/or persons authorized to consent for the patient, the purpose of this procedure and the potential benefits and risks involved with this procedure.  The benefits include less needle sticks, lab draws from the catheter and patient may be discharged home with the catheter.  Risks include, but not limited to, infection, bleeding, blood clot (thrombus formation), and puncture of an artery; nerve damage and irregular heat beat.  Alternatives to this procedure were also discussed.  Pt agitated on Propofol, Versed and Haldol and Fentanyl - unable to sign.  Husband and family at bedside gave consent for procedure  PICC/Midline Placement Documentation  PICC Triple Lumen 06/22/15 PICC Right Brachial 36 cm 0 cm (Active)  Indication for Insertion or Continuance of Line Vasoactive infusions;Limited venous access - need for IV therapy >5 days (PICC only);Poor Vasculature-patient has had multiple peripheral attempts or PIVs lasting less than 24 hours;Administration of hyperosmolar/irritating solutions (i.e. TPN, Vancomycin, etc.);Chronic illness with exacerbations (CF, Sickle Cell, etc.) 06/22/2015 12:40 PM  Exposed Catheter (cm) 0 cm 06/22/2015 12:40 PM  Site Assessment Clean;Dry;Intact;Bleeding 06/22/2015 12:40 PM  Lumen #1 Status Flushed;Saline locked;Blood return noted 06/22/2015 12:40 PM  Lumen #2 Status Flushed;Saline locked;Blood return noted 06/22/2015 12:40 PM  Lumen #3 Status Flushed;Saline locked;Blood return noted 06/22/2015 12:40 PM  Dressing Type Gauze;Occlusive;Pressure;Other (Comment) 06/22/2015 12:40 PM  Dressing Status Clean;Intact;Antimicrobial disc in place;New drainage;Other (Comment) 06/22/2015 12:40 PM  Line Care Connections checked and tightened 06/22/2015 12:40 PM  Line Adjustment (NICU/IV Team Only) No 06/22/2015 12:40 PM  Dressing Intervention New dressing;Dressing reinforced 06/22/2015 12:40 PM  Dressing  Change Due 06/29/15 06/22/2015 12:40 PM       Rolena Infante 06/22/2015, 12:45 PM

## 2015-06-23 DIAGNOSIS — I519 Heart disease, unspecified: Secondary | ICD-10-CM

## 2015-06-23 DIAGNOSIS — D6959 Other secondary thrombocytopenia: Secondary | ICD-10-CM

## 2015-06-23 LAB — CBC WITH DIFFERENTIAL/PLATELET
Basophils Absolute: 0 10*3/uL (ref 0.0–0.1)
Basophils Relative: 0 % (ref 0–1)
Eosinophils Absolute: 0 10*3/uL (ref 0.0–0.7)
Eosinophils Relative: 0 % (ref 0–5)
HCT: 27.2 % — ABNORMAL LOW (ref 36.0–46.0)
HEMOGLOBIN: 8.6 g/dL — AB (ref 12.0–15.0)
Lymphocytes Relative: 9 % — ABNORMAL LOW (ref 12–46)
Lymphs Abs: 0.7 10*3/uL (ref 0.7–4.0)
MCH: 29 pg (ref 26.0–34.0)
MCHC: 31.6 g/dL (ref 30.0–36.0)
MCV: 91.6 fL (ref 78.0–100.0)
MONOS PCT: 2 % — AB (ref 3–12)
Monocytes Absolute: 0.2 10*3/uL (ref 0.1–1.0)
NEUTROS ABS: 6.7 10*3/uL (ref 1.7–7.7)
NEUTROS PCT: 89 % — AB (ref 43–77)
PLATELETS: 131 10*3/uL — AB (ref 150–400)
RBC: 2.97 MIL/uL — AB (ref 3.87–5.11)
RDW: 20.2 % — AB (ref 11.5–15.5)
WBC: 7.5 10*3/uL (ref 4.0–10.5)

## 2015-06-23 LAB — RENAL FUNCTION PANEL
ANION GAP: 9 (ref 5–15)
Albumin: 2.3 g/dL — ABNORMAL LOW (ref 3.5–5.0)
BUN: 23 mg/dL — AB (ref 6–20)
CO2: 33 mmol/L — AB (ref 22–32)
Calcium: 8.3 mg/dL — ABNORMAL LOW (ref 8.9–10.3)
Chloride: 101 mmol/L (ref 101–111)
Creatinine, Ser: 0.4 mg/dL — ABNORMAL LOW (ref 0.44–1.00)
GLUCOSE: 171 mg/dL — AB (ref 65–99)
Phosphorus: 5.7 mg/dL — ABNORMAL HIGH (ref 2.5–4.6)
Potassium: 3.9 mmol/L (ref 3.5–5.1)
SODIUM: 143 mmol/L (ref 135–145)

## 2015-06-23 LAB — MAGNESIUM: MAGNESIUM: 2.2 mg/dL (ref 1.7–2.4)

## 2015-06-23 LAB — GLUCOSE, CAPILLARY
GLUCOSE-CAPILLARY: 146 mg/dL — AB (ref 65–99)
Glucose-Capillary: 117 mg/dL — ABNORMAL HIGH (ref 65–99)
Glucose-Capillary: 122 mg/dL — ABNORMAL HIGH (ref 65–99)
Glucose-Capillary: 148 mg/dL — ABNORMAL HIGH (ref 65–99)
Glucose-Capillary: 119 mg/dL — ABNORMAL HIGH (ref 65–99)
Glucose-Capillary: 161 mg/dL — ABNORMAL HIGH (ref 65–99)
Glucose-Capillary: 167 mg/dL — ABNORMAL HIGH (ref 65–99)

## 2015-06-23 MED ORDER — FUROSEMIDE 10 MG/ML IJ SOLN
40.0000 mg | Freq: Once | INTRAMUSCULAR | Status: AC
  Administered 2015-06-23: 40 mg via INTRAVENOUS
  Filled 2015-06-23: qty 4

## 2015-06-23 MED ORDER — FUROSEMIDE 10 MG/ML IJ SOLN
80.0000 mg | Freq: Once | INTRAMUSCULAR | Status: AC
Start: 2015-06-23 — End: 2015-06-23
  Administered 2015-06-23: 80 mg via INTRAVENOUS
  Filled 2015-06-23: qty 8

## 2015-06-23 MED ORDER — VITAL AF 1.2 CAL PO LIQD
1000.0000 mL | ORAL | Status: DC
  Administered 2015-06-23 – 2015-06-24 (×3): 1000 mL
  Filled 2015-06-23 (×3): qty 1000

## 2015-06-23 NOTE — Progress Notes (Signed)
[facility] Progress Note Patient Name: Sherry Craig DOB: 1946-05-11 MRN: [id number]   Date of Service  06/23/2015  HPI/Events of Note  Goal net negative 500-1000cc, received lasix '40mg'$  at 11:00 this am.   eICU Interventions  S Cr will tolerate the diuresis, will give an additional dose '80mg'$  lasix now and follow     Intervention Category Intermediate Interventions: Other:  Johndaniel Catlin S. 06/23/2015, 4:20 PM

## 2015-06-23 NOTE — Progress Notes (Signed)
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 Currently s/p s/p cycle 4 of carboplatin and taxol (last on 7/28). Admitted on 8/2 w 3-4d h/o progressive dyspnea. CT on admit showed stable right loculated effusion and new ground glass pulmonary infiltrates. PCCM was asked to see later that day to further evaluate her dyspnea as well as comment on the right pleural effusion.   SIGNIFICANT EVENTS  8/2 PCCM consulted for worsening resp failure. We felt effusion not sig to cause sx burden and started empiric steroid trial given concern that this might be a pneumonitis process.  8/3 worsening respiratory failure over-night. Placed on BIPAP. Discussed goals of care w/ husband who agreed to DNR but wanted short term mechanical ventilation in hopes that this could be a reversible process.  8/3 intubation & bronchoscopy  STUDIES:  CT chest 8/1: 1. No CT evidence for acute pulmonary embolus.2. Right lower lobe collapse/consolidation with right pleuralthickening and right pleural effusion. Overall imaging features arenot substantially changed since the prior PET-CT.3. Mediastinal and right hilar lymphadenopathy with left lower lobe pulmonary nodule, consistent with metastatic disease.4. Interval development of interstitial and alveolar ground-glass attenuation, mainly in the left lung. Imaging features are nonspecific and differential considerations include pulmonary hemorrhage, bacterial or atypical infection, radiation pneumonitis, or lymphangitic tumor spread. Echo 8/2>>> EF 99-35%; grade 2 diastolic dysfxn. Estimated CVP 8, PAS estimated 48 mmHg   SUBJECTIVE: Patient still intubated. Requiring vasopressor with heavy  sedation. Still with altered mentation but following commands per RN when sedation held.  VITAL SIGNS: Temp:  [96.4 F (35.8 C)-99 F (37.2 C)] 99 F (37.2 C) (08/08 0800) Pulse Rate:  [55-117] 90 (08/08 0907) Resp:  [19-61] 26 (08/08 0915) BP: (68-194)/(11-101) 185/98 mmHg (08/08 0915) SpO2:  [88 %-100 %] 100 % (08/08 0915) FiO2 (%):  [40 %-50 %] 40 % (08/08 0907) Weight:  [59.9 kg (132 lb 0.9 oz)] 59.9 kg (132 lb 0.9 oz) (08/08 0400)   PHYSICAL EXAMINATION: General:  No distress. Will open eyes. Sedated and agitated at times  Neuro:  Sedated. PERRL. Spontaneously has been moving all 4 extremities. HEENT:  No scleral icterus. ETT in place.  Cardiovascular:  Regular rate & sinus rhythm. No edema.  Lungs:  Mildly decreased breath sounds bilateral bases. Continuing on ventilator. Symmetric chest rise. Scattered rhonchi  Abdomen:  Nondistended. Hypoactive bowel sounds. Soft. Musculoskeletal:  No joint effusion or deformity. Skin:  Warm & dry. Left chest port.  CBC Recent Labs     06/21/15  0600  06/22/15  0411  06/23/15  0500  WBC  8.8  8.4  7.5  HGB  7.2*  8.0*  8.6*  HCT  23.9*  25.7*  27.2*  PLT  145*  147*  131*    Coag's No results for input(s): APTT, INR in the last 72 hours.  BMET Recent Labs     06/21/15  1530  06/22/15  0411  06/23/15  0500  NA  140  143  143  K  4.5  4.4  3.9  CL  99*  102  101  CO2  34*  31  33*  BUN  20  19  23*  CREATININE  0.59  0.57  0.40*  GLUCOSE  134*  154*  171*    Electrolytes Recent Labs     06/21/15  1530  06/22/15  0411  06/23/15  0500  CALCIUM  8.4*  8.4*  8.3*  MG  2.2  2.4  2.2  PHOS  2.7  3.1  5.7*    Sepsis Markers Recent Labs     06/21/15  0600  PROCALCITON  0.55    ABG Recent Labs     06/20/15  1220  06/21/15  1307  PHART  7.316*  7.450  PCO2ART  58.0*  45.4*  PO2ART  94.9  158*    Liver Enzymes Recent Labs     06/21/15  1530  06/22/15  0411  06/23/15  0500  ALBUMIN  2.2*  2.4*  2.3*      Cardiac Enzymes Recent Labs     06/20/15  1156  TROPONINI  0.15*    Glucose Recent Labs     06/22/15  1317  06/22/15  1612  06/22/15  2021 06/23/15  06/23/15  0436  06/23/15  0732  GLUCAP  143*  155*  117*  122*  148*  119*    Imaging Dg Chest Port 1 View  06/22/2015   CLINICAL DATA:  Hypoxia, history lung cancer  EXAM: PORTABLE CHEST - 1 VIEW  COMPARISON:  Portable exam 1400 hours compared to 06/21/2015  FINDINGS: Tip of endotracheal tube projects 1.6 cm above carina.  Nasogastric tube extends into stomach.  RIGHT arm PICC line tip projects over SVC.  LEFT subclavian Port-A-Cath tip projects over SVC.  Normal heart size, mediastinal contours.  Pulmonary vascular congestion.  BILATERAL pulmonary infiltrates unchanged.  Persistent RIGHT pleural effusion.  No pneumothorax or acute osseous findings.  IMPRESSION: Persistent BILATERAL pulmonary infiltrates and RIGHT pleural effusion.  No significant interval change.   Electronically Signed   By: Lavonia Dana M.D.   On: 06/22/2015 14:11   Dg Abd Portable 1v  06/21/2015   CLINICAL DATA:  69 year old female with a history of gastric tube placement.  EXAM: PORTABLE ABDOMEN - 1 VIEW  COMPARISON:  06/18/2015  FINDINGS: Single image of the abdomen with exclusion of portions of the right abdomen.  Gastric tube projects over the left mid abdomen, unchanged from the prior. Side hole appears well beyond the gastroesophageal junction.  Soft tissue density of the upper abdomen is again visualized, in this patient with hepatic megaly demonstrated on prior CT imaging.  Gas within small bowel and colon. No abnormally distended small bowel or colon.  No unexpected radiopaque foreign body. No unexpected calcifications.  Degenerative changes of the spine.  IMPRESSION: Gastric tube terminates within the left mid abdomen, likely within the stomach along the greater curvature.  Soft tissue density of the mid abdomen in this patient with hepatomegaly demonstrated on  prior CT imaging.  Nonobstructive bowel gas pattern.  Signed,  Dulcy Fanny. Earleen Newport, DO  [facility]  [facility]   Electronically Signed   By: Corrie Mckusick D.O.   On: 06/21/2015 12:38  no sig change.   ASSESSMENT / PLAN:  PULMONARY OETT 8/3>> A: Acute hypoxic and Hypercarbic respiratory failure (room air sats 86% on admit) in the setting of diffuse ground glass pulmonary infiltrates L>R: in setting of chemo induced pneumonitis vs lymphangitic spread of her cancer. BAL 8/3-showed atypical cells  Right malignant pleural effusion: this has not changed significantly radiographically and is would not explain the level of symptom burden she is experiencing.  >  has not significantly improved clinically or really radiographically. Concerned that even with what improvements she has made doubt it would sustain unassisted breathing for long. P: Solumedrol IV for pneumonitis (started 8/2) ARDS protocol  SBT when mental status allows Requiring high MV  Add lasix (would use pressors as needed to support BP) If no sig improvement in next 48 hrs family prepared for terminal wean   CARDIOVASCULAR  Port A:  Bradycardia - Related to sedation SIRS - R/o sepsis.  Lactic acid on presentation was neg. PCT was neg. Clinical picture not compelling story for infection  P: Monitoring electrolytes daily Tele monitoring Neo to maintain MAP >65 PICC consult for access  Renal A: No acute issues  P: Strict I&O Monitor UOP Electrolytes daily Monitor renal fxn w/ daily BUN/Creatinine  GASTROINTESTINAL  A: No acute issues.  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  Thrombocytopenia  P: Continue Lovenox DVT prophylaxis Trend Hgb daily with CBC Transfuse for Hgb <7.0 Trend platelets daily w/  CBC  ENDOCRINE A: Hyperglycemia  Solu-Medrol IV  P: SSI algothrim Accuchecks q4hr  Neuro: A:  Acute encephalopathy - Worsening. Possible Delirium  P: Cont current versed,fentanyl and precedex gtts.  RAS goal -2   INFECTIOUS DISEASE A: R/o PNA viral vs bacterial. See above pulm section   F/u RVP 8/2 (swab)>>>neg BAL 8/3>>>ng BCX2 8/1>>>ng UC 8/1>>>ng  P: Trending leukocyte count daily w/ CBC Monitoring for fever vanc 8/1>>> 8/5 Zosyn 8/1>>> azithro 8/3>>>8/7 (planned)   Family - Daughter updated at bedside.   TODAY'S SUMMARY:  Has had marginal CXR improvement since last week, but work of breathing and overall clinical status has made little to no progress. I am concerned that the presence of atypical cells may reflect lymphangitic  spread and if so there is likely little we can do to fix this. Have had long discussion w/ her husband. At this time we will add lasix in hopes of mobilizing lung water. Doubt that this will do much but little else to offer. If no improvement by Wednesday we would likely look at terminal extubation.   Erick Colace ACNP-BC [place] Pager # [phone] OR # [phone] if no answer

## 2015-06-23 NOTE — Progress Notes (Signed)
RT called to patient's room by RN due to sat 86%. Increased FIO2 to 100%. PA Marni Griffon came to room and increased peep to 8.  Will wean FIO2 back down as tolarated. At 50%, SAT 93.

## 2015-06-23 NOTE — Progress Notes (Signed)
Sherry Craig   DOB:19-Sep-1946   IN#:[id number]   BSJ#:[id number]  Patient Care Team: Lester Kinsman, PA-C as PCP - General (Physician Assistant) Thea Silversmith, MD (Radiation Oncology) Jerrell Belfast, MD (Otolaryngology) Heath Lark, MD as Consulting Physician (Hematology and Oncology) Leota Sauers, RN as Oncology Nurse Navigator (Oncology) Karie Mainland, RD as Dietitian (Nutrition)  I have seen the patient, examined her and edited the notes as follows  Subjective: patient seen and examined. She remains intubated and sedated due to agitation. Per chart report, she is able to open, close eyes and follow commands when not sedated. She continues to be on pressors. She is also bradycardic due to sedatives. Other history cannot be obtained at this time due to current status.  Scheduled Meds: . antiseptic oral rinse  7 mL Mouth Rinse QID  . azithromycin  500 mg Intravenous Q24H  . chlorhexidine  15 mL Mouth Rinse BID  . enoxaparin (LOVENOX) injection  40 mg Subcutaneous Q24H  . feeding supplement (PRO-STAT SUGAR FREE 64)  30 mL Per Tube q morning - 10a  . feeding supplement (VITAL AF 1.2 CAL)  1,000 mL Per Tube Q24H  . free water  200 mL Per Tube 3 times per day  . insulin aspart  0-9 Units Subcutaneous 6 times per day  . methylPREDNISolone (SOLU-MEDROL) injection  60 mg Intravenous 3 times per day  . multivitamin  5 mL Per Tube Daily  . pantoprazole sodium  40 mg Per Tube Q1200  . piperacillin-tazobactam (ZOSYN)  IV  3.375 g Intravenous Q8H  . pravastatin  40 mg Per Tube Daily  . sodium chloride  10-40 mL Intracatheter Q12H  . sodium chloride  10-40 mL Intracatheter Q12H   Continuous Infusions: . sodium chloride 10 mL/hr at 06/22/15 2200  . sodium chloride Stopped (06/22/15 2200)  . dexmedetomidine 0.7 mcg/kg/hr (06/23/15 0454)  . fentaNYL infusion INTRAVENOUS 400 mcg/hr (06/23/15 0548)  . midazolam (VERSED) infusion 2.5 mg/hr (06/23/15 0145)  . phenylephrine  (NEO-SYNEPHRINE) Adult infusion 15 mcg/min (06/23/15 0500)   PRN Meds:.acetaminophen **OR** acetaminophen, fentaNYL, haloperidol lactate, midazolam, sodium chloride, sodium chloride  Objective:  Filed Vitals:   06/23/15 0600  BP: 126/51  Pulse:   Temp:   Resp: 30      Intake/Output Summary (Last 24 hours) at 06/23/15 0655 Last data filed at 06/23/15 0600  Gross per 24 hour  Intake 3502.88 ml  Output   2150 ml  Net 1352.88 ml     GENERAL: Intubated and sedated. cachectic SKIN: skin color, texture, turgor are normal, no rashes or significant lesions EYES: Closed OROPHARYNX ET tube in situ. NECK: supple, thyroid normal size, non-tender, without nodularity LYMPH:  no palpable lymphadenopathy in the cervical, axillary or inguinal LUNGS: On ventilator. bibasilar rales, left greater than right, no rhonchi or wheezing. Decreased breath sounds on the right base. Uses accessory muscles HEART: bradycardic, regular rate & rhythm and no murmurs and no lower extremity edema ABDOMEN: soft, non-tender and decreased bowel sounds   CBG (last 3)   Recent Labs  06/22/15 2021 06/23/15 06/23/15 0436  GLUCAP 117* 122* 148*     Labs:   Recent Labs Lab 06/24/2015 2056 06/17/15 0235 06/18/15 0336 06/20/15 0330 06/21/15 0600 06/22/15 0411 06/23/15 0500  WBC 7.2 6.8 11.2* 8.3 8.8 8.4 7.5  HGB 8.7* 8.8* 9.4* 7.6* 7.2* 8.0* 8.6*  HCT 27.2* 27.2* 29.8* 25.3* 23.9* 25.7* 27.2*  PLT 254 216 231 160 145* 147* 131*  MCV 86.9 86.6 87.6 89.4  90.2 90.5 91.6  MCH 27.8 28.0 27.6 26.9 27.2 28.2 29.0  MCHC 32.0 32.4 31.5 30.0 30.1 31.1 31.6  RDW 18.3* 18.2* 18.1* 18.5* 19.2* 19.5* 20.2*  LYMPHSABS 0.9 0.8 0.5*  --   --  0.7 0.7  MONOABS 0.3 0.3 0.3  --   --  0.3 0.2  EOSABS 0.1 0.0 0.0  --   --  0.0 0.0  BASOSABS 0.0 0.0 0.0  --   --  0.0 0.0     Chemistries:    Recent Labs Lab 06/24/2015 2056 06/17/15 0235  06/19/15 0520 06/20/15 0330 06/21/15 1530 06/22/15 0411 06/23/15 0500  NA  133* 137  < > 136 138 140 143 143  K 4.1 3.9  < > 4.5 4.6 4.5 4.4 3.9  CL 100* 105  < > 102 100* 99* 102 101  CO2 24 24  < > 26 29 34* 31 33*  GLUCOSE 110* 111*  < > 169* 170* 134* 154* 171*  BUN 10 8  < > '12 17 20 19 '$ 23*  CREATININE 0.60 0.61  < > 0.68 0.68 0.59 0.57 0.40*  CALCIUM 9.1 8.6*  < > 8.9 8.6* 8.4* 8.4* 8.3*  MG  --   --   --   --   --  2.2 2.4 2.2  AST 22 19  --   --   --   --   --   --   ALT 12* 11*  --   --   --   --   --   --   ALKPHOS 76 75  --   --   --   --   --   --   BILITOT 0.6 0.6  --   --   --   --   --   --   < > = values in this interval not displayed.  GFR Estimated Creatinine Clearance: 57.4 mL/min (by C-G formula based on Cr of 0.4).  Liver Function Tests:  Recent Labs Lab 07/11/2015 2056 06/17/15 0235 06/21/15 1530 06/22/15 0411 06/23/15 0500  AST 22 19  --   --   --   ALT 12* 11*  --   --   --   ALKPHOS 76 75  --   --   --   BILITOT 0.6 0.6  --   --   --   PROT 7.4 6.6  --   --   --   ALBUMIN 3.4* 3.0* 2.2* 2.4* 2.3*     Cardiac Enzymes:  Recent Labs Lab 06/17/15 0655 06/20/15 1156  TROPONINI <0.03 0.15*    Microbiology Negative to date   Imaging Studies:  New chest x ray is pending.   Assessment/Plan: 69 y.o.  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 Imaging study between the PET CT scan prior to treatment and CT scan of the chest were compared Overall, she is improving with treatment  Continue supportive care for now   Acute respiratory failure with hypoxia.  She reported increasing shortness of breath with minimal exertion and non productive cough since her last chemo. Chest X ray on 8/2 showed Stable small to moderate right pleural effusion with right base collapse/ consolidation, with possible pulmonary edema.  CT angio chest on 8/1 was negative for pulmonary embolism, similar chest x  ray findings, suspicious for pneumonia. She was transferred to the  SDU, and began management by CCM/ Pulmonary, with IV antibiotics and supportive therapy. Cultures were negative Due to the fact that she appeared to be responding to treatment on recent imaging study, aggressive care is recommended.  Cytology from bronchial washing is non diagnostic Continue high dose steroids dose & weaning effort  Anemia due to antineoplastic chemotherapy This is likely due to recent treatment, current hospitalization, antibiotics, dilution.  No recent history of bleeding was noted,such as epistaxis, hematuria or hematochezia.  Current Hb 8.6 Cannot assess if patient is symptomatic as she remains intubated. May need transfusion if Hb lowers to less than 7  Thrombocytopenia, mild In the setting of acute illness, malignancy, polypharmacy, dilution No acute bleeding issues noted. Continue anticoagulation with Lovenox unless platelet count is less than 50,000 Current value is at 131,000 Will monitor.  Protein calorie malnutrition Appreciate Nutrition involvement  Grade 2 diastolic dysfunction Echo 8/2 showed EF 51-70%; grade 2 diastolic dysfunction  DVT prophylaxis On Lovenox  Partial Code No code blue but allowing mechanical ventilation for reversible causes Unfortunately she was not making good progress. I will defer to primary service/critical care medicine decision regarding terminal weaning   Other medical issues as per admitting team  Will follow   Rondel Jumbo, PA-C 06/23/2015  6:55 AM Willistine Ferrall, MD 06/23/2015

## 2015-06-23 NOTE — Care Management Important Message (Signed)
Important Message  Patient Details  Name: [name] MRN: [id number] Date of Birth: 1946-10-19   Medicare Important Message Given:  Yes-third notification given    Camillo Flaming 06/23/2015, 12:09 [place] Message  Patient Details  Name: [name] MRN: [id number] Date of Birth: 07-21-1946   Medicare Important Message Given:  Yes-third notification given    Camillo Flaming 06/23/2015, 12:08 PM

## 2015-06-23 NOTE — Progress Notes (Signed)
Nutrition Follow-up  DOCUMENTATION CODES:   Non-severe (moderate) malnutrition in context of chronic illness  INTERVENTION:   -Advance Vital AF 1.2 @ 35 mL/hr by 10 ml every 4 hours until goal rate of 45 ml/hr is reached. -30 ml Prostat daily -TF regimen provides 1396 kcal (100% estimated needs), 96 g protein and 876 ml H2O.  - RD will continue to monitor for needs   NUTRITION DIAGNOSIS:   Malnutrition related to chronic illness as evidenced by percent weight loss, moderate depletion of body fat, moderate depletions of muscle mass.  Ongoing.  GOAL:   Patient will meet greater than or equal to 90% of their needs  Meeting.  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 Currently s/p s/p cycle 4 of carboplatin and taxol (last on 7/28). Admitted on 8/2 w 3-4d h/o progressive dyspnea.   Patient is currently intubated on ventilator support MV: 10.8 L/min Temp (24hrs), Avg:97.4 F (36.3 C), Min:96.4 F (35.8 C), Max:99 F (37.2 C)  Propofol: none  RD to advance TF rate as propofol no longer contributing to daily kcal.   Per MD note, if pt's condition does not improve in next 48 hours, pt may be extubated.  Labs reviewed: CBGs: 119-148 Elevated BUN, Phos Low Creatinine  Diet Order:     Skin:  Reviewed, no issues  Last BM:  8/2  Height:   Ht Readings from Last 1 Encounters:  06/18/15 '5\' 1"'$  (1.549 m)    Weight:   Wt Readings from Last 1 Encounters:  06/23/15 132 lb 0.9 oz (59.9 kg)    Ideal Body Weight:  45 kg  BMI:  Body mass index is 24.96 kg/(m^2).  Estimated Nutritional Needs:   Kcal:  9030  Protein:  90-100g  Fluid:  1.5L/day  EDUCATION NEEDS:   No education needs identified at this time  Clayton Bibles, MS, RD,  LDN Pager: [phone] After Hours Pager: [phone]

## 2015-06-23 NOTE — Progress Notes (Signed)
Date:  June 23, 2015 U.R. performed for needs and level of care. Will continue to follow for Case Management needs.  Sandor Arboleda, RN, BSN, CCM   [phone] 

## 2015-06-24 ENCOUNTER — Inpatient Hospital Stay (HOSPITAL_COMMUNITY): Payer: Medicare Other

## 2015-06-24 LAB — CBC WITH DIFFERENTIAL/PLATELET
Basophils Absolute: 0 10*3/uL (ref 0.0–0.1)
Basophils Relative: 0 % (ref 0–1)
EOS ABS: 0 10*3/uL (ref 0.0–0.7)
EOS PCT: 0 % (ref 0–5)
HCT: 29.8 % — ABNORMAL LOW (ref 36.0–46.0)
Hemoglobin: 9.1 g/dL — ABNORMAL LOW (ref 12.0–15.0)
LYMPHS ABS: 0.5 10*3/uL — AB (ref 0.7–4.0)
Lymphocytes Relative: 9 % — ABNORMAL LOW (ref 12–46)
MCH: 28 pg (ref 26.0–34.0)
MCHC: 30.5 g/dL (ref 30.0–36.0)
MCV: 91.7 fL (ref 78.0–100.0)
MONO ABS: 0.2 10*3/uL (ref 0.1–1.0)
Monocytes Relative: 3 % (ref 3–12)
NEUTROS ABS: 5.2 10*3/uL (ref 1.7–7.7)
NEUTROS PCT: 88 % — AB (ref 43–77)
PLATELETS: 122 10*3/uL — AB (ref 150–400)
RBC: 3.25 MIL/uL — AB (ref 3.87–5.11)
RDW: 21.3 % — ABNORMAL HIGH (ref 11.5–15.5)
WBC: 5.9 10*3/uL (ref 4.0–10.5)

## 2015-06-24 LAB — RENAL FUNCTION PANEL
Albumin: 2.4 g/dL — ABNORMAL LOW (ref 3.5–5.0)
Anion gap: 7 (ref 5–15)
BUN: 25 mg/dL — ABNORMAL HIGH (ref 6–20)
CO2: 40 mmol/L — ABNORMAL HIGH (ref 22–32)
Calcium: 8.1 mg/dL — ABNORMAL LOW (ref 8.9–10.3)
Chloride: 97 mmol/L — ABNORMAL LOW (ref 101–111)
Creatinine, Ser: 0.58 mg/dL (ref 0.44–1.00)
GFR calc Af Amer: >60 mL/min (ref >60)
GFR calc non Af Amer: >60 mL/min (ref >60)
Glucose, Bld: 186 mg/dL — ABNORMAL HIGH (ref 65–99)
PHOSPHORUS: 3.3 mg/dL (ref 2.5–4.6)
POTASSIUM: 3.8 mmol/L (ref 3.5–5.1)
Sodium: 144 mmol/L (ref 135–145)

## 2015-06-24 LAB — GLUCOSE, CAPILLARY
GLUCOSE-CAPILLARY: 178 mg/dL — AB (ref 65–99)
GLUCOSE-CAPILLARY: 143 mg/dL — AB (ref 65–99)
GLUCOSE-CAPILLARY: 147 mg/dL — AB (ref 65–99)
Glucose-Capillary: 170 mg/dL — ABNORMAL HIGH (ref 65–99)
Glucose-Capillary: 187 mg/dL — ABNORMAL HIGH (ref 65–99)
Glucose-Capillary: 187 mg/dL — ABNORMAL HIGH (ref 65–99)

## 2015-06-24 LAB — MAGNESIUM: Magnesium: 2.3 mg/dL (ref 1.7–2.4)

## 2015-06-24 MED ORDER — VITAMINS A & D EX OINT
TOPICAL_OINTMENT | CUTANEOUS | Status: AC
  Filled 2015-06-24: qty 5

## 2015-06-24 MED ORDER — FUROSEMIDE 10 MG/ML IJ SOLN
40.0000 mg | Freq: Once | INTRAMUSCULAR | Status: AC
  Administered 2015-06-24: 40 mg via INTRAVENOUS
  Filled 2015-06-24: qty 4

## 2015-06-24 NOTE — Progress Notes (Signed)
Ms. Sherry Craig daughter, pastor (and his wife), and friend were bedside during our visit. Pt intubated and resting. Daughter wanted prayer during which she was appropriately tearful. Pastor's wife said they will probably need additional support if anything changes or pt passes. Please page for Chaplain support at that time. Chaplain Marlise Eves Holder   06/24/15 1900  Clinical Encounter Type  Visited With Family

## 2015-06-24 NOTE — Progress Notes (Signed)
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 Currently s/p s/p cycle 4 of carboplatin and taxol (last on 7/28). Admitted on 8/2 w 3-4d h/o progressive dyspnea. CT on admit showed stable right loculated effusion and new ground glass pulmonary infiltrates. PCCM was asked to see later that day to further evaluate her dyspnea as well as comment on the right pleural effusion.   SIGNIFICANT EVENTS  8/2 PCCM consulted for worsening resp failure. We felt effusion not sig to cause sx burden and started empiric steroid trial given concern that this might be a pneumonitis process.  8/3 worsening respiratory failure over-night. Placed on BIPAP. Discussed goals of care w/ husband who agreed to DNR but wanted short term mechanical ventilation in hopes that this could be a reversible process.  8/3 intubation & bronchoscopy  STUDIES:  CT chest 8/1: 1. No CT evidence for acute pulmonary embolus.2. Right lower lobe collapse/consolidation with right pleuralthickening and right pleural effusion. Overall imaging features arenot substantially changed since the prior PET-CT.3. Mediastinal and right hilar lymphadenopathy with left lower lobe pulmonary nodule, consistent with metastatic disease.4. Interval development of interstitial and alveolar ground-glass attenuation, mainly in the left lung. Imaging features are nonspecific and differential considerations include pulmonary hemorrhage, bacterial or atypical infection, radiation pneumonitis, or lymphangitic tumor spread. Echo 8/2>>> EF 95-62%; grade 2 diastolic dysfxn. Estimated CVP 8, PAS estimated 48 mmHg   SUBJECTIVE: Remains intubated. Requiring Neosynephrine for BP support with  diuresis overnight.  ROS:  Unobtainable as patient intubated & sedated.  VITAL SIGNS: Temp:  [97.5 F (36.4 C)-99.8 F (37.7 C)] 99.5 F (37.5 C) (08/09 0800) Pulse Rate:  [76-123] 78 (08/09 0904) Resp:  [19-42] 31 (08/09 1000) BP: (62-151)/(34-89) 145/72 mmHg (08/09 1000) SpO2:  [92 %-100 %] 97 % (08/09 1000) FiO2 (%):  [40 %-50 %] 40 % (08/09 0904) Weight:  [126 lb 5.2 oz (57.3 kg)] 126 lb 5.2 oz (57.3 kg) (08/09 0400)   PHYSICAL EXAMINATION: General:  No distress. Sedated and agitated at times  Neuro:  Sedated. PERRL. Spontaneously has been moving all 4 extremities. HEENT:  No scleral icterus or injection. ETT in place.  Cardiovascular:  Regular rate & sinus rhythm. No edema.  Lungs:  Mildly decreased breath sounds bilateral bases. Symmetric chest rise.  Abdomen:  Nondistended. Hypoactive bowel sounds. Soft. Musculoskeletal:  No joint effusion or deformity. Skin:  Warm & dry. Left chest port accessed.  CBC Recent Labs     06/22/15  0411  06/23/15  0500  06/24/15  0450  WBC  8.4  7.5  5.9  HGB  8.0*  8.6*  9.1*  HCT  25.7*  27.2*  29.8*  PLT  147*  131*  122*    Coag's No results for input(s): APTT, INR in the last 72 hours.  BMET Recent Labs     06/22/15  0411  06/23/15  0500  06/24/15  0450  NA  143  143  144  K  4.4  3.9  3.8  CL  102  101  97*  CO2  31  33*  40*  BUN  19  23*  25*  CREATININE  0.57  0.40*  0.58  GLUCOSE  154*  171*  186*    Electrolytes Recent Labs     06/22/15  0411  06/23/15  0500  06/24/15  0450  CALCIUM  8.4*  8.3*  8.1*  MG  2.4  2.2  2.3  PHOS  3.1  5.7*  3.3    Sepsis Markers No results for input(s): PROCALCITON, O2SATVEN in the last 72 hours.  Invalid input(s): LACTICACIDVEN  ABG Recent Labs     06/21/15  1307  PHART  7.450  PCO2ART  45.4*  PO2ART  158*    Liver Enzymes Recent Labs     06/22/15  0411  06/23/15  0500  06/24/15  0450  ALBUMIN  2.4*  2.3*  2.4*    Cardiac Enzymes No results for  input(s): TROPONINI, PROBNP in the last 72 hours.  Glucose Recent Labs     06/23/15  1136  06/23/15  1556  06/23/15  2032  06/24/15  0011  06/24/15  0413  06/24/15  0831  GLUCAP  161*  146*  167*  187*  178*  143*    Imaging Dg Chest Port 1 View  06/22/2015   CLINICAL DATA:  Hypoxia, history lung cancer  EXAM: PORTABLE CHEST - 1 VIEW  COMPARISON:  Portable exam 1400 hours compared to 06/21/2015  FINDINGS: Tip of endotracheal tube projects 1.6 cm above carina.  Nasogastric tube extends into stomach.  RIGHT arm PICC line tip projects over SVC.  LEFT subclavian Port-A-Cath tip projects over SVC.  Normal heart size, mediastinal contours.  Pulmonary vascular congestion.  BILATERAL pulmonary infiltrates unchanged.  Persistent RIGHT pleural effusion.  No pneumothorax or acute osseous findings.  IMPRESSION: Persistent BILATERAL pulmonary infiltrates and RIGHT pleural effusion.  No significant interval change.   Electronically Signed   By: Lavonia Dana M.D.   On: 06/22/2015 14:11  no sig change.   ASSESSMENT / PLAN: PULMONARY OETT 8/3>> A: Acute hypoxic and Hypercarbic respiratory failure (room air sats 86% on admit) in the setting of diffuse ground glass pulmonary infiltrates L>R: in setting of chemo induced pneumonitis vs lymphangitic spread of her cancer. BAL 8/3-showed atypical cells  Right malignant pleural effusion: this has not changed significantly radiographically and is would not explain the level of symptom burden she is experiencing.  >has not significantly improved clinically or really radiographically. Concerned that even with what improvements she has made doubt it would sustain unassisted breathing for long.  P: Solumedrol IV for pneumonitis (started 8/2) ARDS protocol  Plan for extubation tomorrow per husband & patient's wishes Requiring high MV  Continue Lasix IV intermittently for diuresis  CARDIOVASCULAR  Port A:  Bradycardia - Related to sedation SIRS - Question  sepsis.  P: Monitoring electrolytes daily Tele monitoring Neo to maintain MAP >65 PICC consult for access  Renal A: No acute issues  P: Strict I&O Monitor UOP Electrolytes daily Monitor renal fxn w/ daily BUN/Creatinine Continuing Lasix IV intermittently for diuresis  GASTROINTESTINAL  A: No acute issues.  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  Thrombocytopenia - worsening  P: Continue Lovenox DVT prophylaxis Trend Hgb daily with CBC Transfuse for Hgb <7.0 Trend platelets daily w/ CBC  ENDOCRINE A: Hyperglycemia  Solu-Medrol IV  P: SSI algothrim Accuchecks q4hr  Neuro: A:  Acute encephalopathy - Worsening. Possible Delirium  P: Continuing Versed, Fentanyl, & Precedex drips RASS: 0 to -1  INFECTIOUS DISEASE A: HCAP - See above pulm section. No organism identified after bronch.   P: Trending leukocyte count daily w/ CBC Monitoring for fever  vanc 8/1>>> 8/5 Zosyn 8/1>>> azithro 8/3>>>8/7  RVP 8/2 (swab)>>>neg BAL 8/3>>>ng BCX2 8/1>>>ng UC 8/1>>>ng   Family - Husband at bedside today 8/9.  TODAY'S SUMMARY: Patient able to achieve diuresis overnight but requiring low dose vasopressor support. Still requiring high levels of sedation for tolerance of ETT & ventilator. Husband at bedside today and still planning on extubation tomorrow to respect patient's wishes. Continuing to try to achieve diuresis prior to extubation.  I have spent a total of 39 minutes of critical care time today updating the patient's husband as well as discussing the plan of care, caring for the patient, and reviewing the patient's EMR.  Sonia Baller Ashok Cordia, M.D. [facility] Pager:  [phone] After 3pm or if no response, call [phone]

## 2015-06-25 ENCOUNTER — Encounter: Payer: Self-pay | Admitting: *Deleted

## 2015-06-25 DIAGNOSIS — J90 Pleural effusion, not elsewhere classified: Secondary | ICD-10-CM

## 2015-06-25 LAB — CBC WITH DIFFERENTIAL/PLATELET
Basophils Absolute: 0 10*3/uL (ref 0.0–0.1)
Basophils Relative: 0 % (ref 0–1)
EOS ABS: 0 10*3/uL (ref 0.0–0.7)
EOS PCT: 0 % (ref 0–5)
HCT: 28.3 % — ABNORMAL LOW (ref 36.0–46.0)
HEMOGLOBIN: 8.5 g/dL — AB (ref 12.0–15.0)
Lymphocytes Relative: 7 % — ABNORMAL LOW (ref 12–46)
Lymphs Abs: 0.5 10*3/uL — ABNORMAL LOW (ref 0.7–4.0)
MCH: 27.6 pg (ref 26.0–34.0)
MCHC: 30 g/dL (ref 30.0–36.0)
MCV: 91.9 fL (ref 78.0–100.0)
Monocytes Absolute: 0.2 10*3/uL (ref 0.1–1.0)
Monocytes Relative: 3 % (ref 3–12)
NEUTROS PCT: 90 % — AB (ref 43–77)
Neutro Abs: 6.3 10*3/uL (ref 1.7–7.7)
PLATELETS: 126 10*3/uL — AB (ref 150–400)
RBC: 3.08 MIL/uL — ABNORMAL LOW (ref 3.87–5.11)
RDW: 21.5 % — ABNORMAL HIGH (ref 11.5–15.5)
WBC: 7 10*3/uL (ref 4.0–10.5)

## 2015-06-25 LAB — RENAL FUNCTION PANEL
ALBUMIN: 2.4 g/dL — AB (ref 3.5–5.0)
Anion gap: 5 (ref 5–15)
BUN: 25 mg/dL — ABNORMAL HIGH (ref 6–20)
CALCIUM: 8.4 mg/dL — AB (ref 8.9–10.3)
CHLORIDE: 98 mmol/L — AB (ref 101–111)
CO2: 40 mmol/L — AB (ref 22–32)
CREATININE: 0.59 mg/dL (ref 0.44–1.00)
GFR calc Af Amer: >60 mL/min (ref >60)
GFR calc non Af Amer: >60 mL/min (ref >60)
GLUCOSE: 177 mg/dL — AB (ref 65–99)
PHOSPHORUS: 2.6 mg/dL (ref 2.5–4.6)
POTASSIUM: 4.2 mmol/L (ref 3.5–5.1)
SODIUM: 143 mmol/L (ref 135–145)

## 2015-06-25 LAB — GLUCOSE, CAPILLARY
GLUCOSE-CAPILLARY: 165 mg/dL — AB (ref 65–99)
Glucose-Capillary: 170 mg/dL — ABNORMAL HIGH (ref 65–99)
Glucose-Capillary: 202 mg/dL — ABNORMAL HIGH (ref 65–99)

## 2015-06-25 LAB — MAGNESIUM: MAGNESIUM: 2.4 mg/dL (ref 1.7–2.4)

## 2015-06-25 MED ORDER — SODIUM CHLORIDE 0.9 % IV SOLN
25.0000 ug/h | INTRAVENOUS | Status: DC
  Filled 2015-06-25: qty 50

## 2015-06-25 MED ORDER — ATROPINE SULFATE 1 % OP SOLN
2.0000 [drp] | Freq: Three times a day (TID) | OPHTHALMIC | Status: DC | PRN
  Administered 2015-06-25: 2 [drp] via SUBLINGUAL
  Filled 2015-06-25: qty 2

## 2015-06-25 MED ORDER — MIDAZOLAM BOLUS VIA INFUSION
2.0000 mg | INTRAVENOUS | Status: DC | PRN
  Filled 2015-06-25: qty 4

## 2015-06-25 MED ORDER — FENTANYL BOLUS VIA INFUSION
50.0000 ug | INTRAVENOUS | Status: DC | PRN
  Administered 2015-06-25: 100 ug via INTRAVENOUS
  Filled 2015-06-25: qty 100

## 2015-06-26 ENCOUNTER — Other Ambulatory Visit: Payer: Self-pay

## 2015-06-26 ENCOUNTER — Ambulatory Visit: Payer: Self-pay

## 2015-06-27 ENCOUNTER — Telehealth: Payer: Self-pay

## 2015-06-27 NOTE — Telephone Encounter (Signed)
On 07/14/15 I received a death certificate from [employer]. The death certificate is for burial. The patient is a patient of [employer]. The death certificate will be taken to the ICU unit of [facility] this am for signature. On 07/14/15 I received the death certificate back from [place]. I got the death certificate ready for pickup. I called the funeral home to let them know the death certificate was ready for pickup.

## 2015-07-08 ENCOUNTER — Other Ambulatory Visit: Payer: Self-pay | Admitting: Hematology and Oncology

## 2015-07-10 ENCOUNTER — Other Ambulatory Visit: Payer: Self-pay

## 2015-07-10 ENCOUNTER — Ambulatory Visit: Payer: Self-pay

## 2015-07-10 ENCOUNTER — Ambulatory Visit: Payer: Self-pay | Admitting: Hematology and Oncology

## 2015-07-13 LAB — FUNGUS CULTURE W SMEAR

## 2015-07-17 NOTE — Progress Notes (Signed)
PCCM Attending Note: Notified by RN that family is ready for terminal wean. Plan for comfort care for the patient. Orders being placed at this time.  Sonia Baller Ashok Cordia, M.D. [facility] Pager:  [phone] After 3pm or if no response, call [phone]

## 2015-07-17 NOTE — Progress Notes (Signed)
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 Currently s/p s/p cycle 4 of carboplatin and taxol (last on 7/28). Admitted on 8/2 w 3-4d h/o progressive dyspnea. CT on admit showed stable right loculated effusion and new ground glass pulmonary infiltrates. PCCM was asked to see later that day to further evaluate her dyspnea as well as comment on the right pleural effusion.   SIGNIFICANT EVENTS  8/2 PCCM consulted for worsening resp failure. We felt effusion not sig to cause sx burden and started empiric steroid trial given concern that this might be a pneumonitis process.  8/3 worsening respiratory failure over-night. Placed on BIPAP. Discussed goals of care w/ husband who agreed to DNR but wanted short term mechanical ventilation in hopes that this could be a reversible process.  8/3 intubation & bronchoscopy  STUDIES:  CT chest 8/1: 1. No CT evidence for acute pulmonary embolus.2. Right lower lobe collapse/consolidation with right pleuralthickening and right pleural effusion. Overall imaging features arenot substantially changed since the prior PET-CT.3. Mediastinal and right hilar lymphadenopathy with left lower lobe pulmonary nodule, consistent with metastatic disease.4. Interval development of interstitial and alveolar ground-glass attenuation, mainly in the left lung. Imaging features are nonspecific and differential considerations include pulmonary hemorrhage, bacterial or atypical infection, radiation pneumonitis, or lymphangitic tumor spread. Echo 8/2>>> EF 75-64%; grade 2 diastolic dysfxn. Estimated CVP 8, PAS estimated 48 mmHg   SUBJECTIVE: Remains critically ill on vasopressor support requiring high  levels of sedation for tolerance of ETT and ventilator.  ROS:  Unobtainable as patient intubated & sedated.  VITAL SIGNS: Temp:  [97.9 F (36.6 C)-99.7 F (37.6 C)] 97.9 F (36.6 C) (08/10 0800) Pulse Rate:  [68-92] 68 (08/10 0842) Resp:  [17-64] 30 (08/10 0842) BP: (83-182)/(40-101) 121/52 mmHg (08/10 0842) SpO2:  [95 %-100 %] 98 % (08/10 0842) FiO2 (%):  [30 %-35 %] 30 % (08/10 0842) Weight:  [122 lb 12.7 oz (55.7 kg)] 122 lb 12.7 oz (55.7 kg) (08/10 0700)   PHYSICAL EXAMINATION: General:  No distress. Sedated. Husband & daughters at bedside. Neuro:  Sedated. PERRL.  HEENT:  No scleral icterus or injection. ETT in place.  Cardiovascular:  Regular rate & sinus rhythm. Mild edema LUE. Lungs:  Mildly decreased breath sounds bilateral bases. Symmetric chest rise on ventilator.  Abdomen:  Nondistended. Hypoactive bowel sounds. Soft. Musculoskeletal:  No joint effusion or deformity. Skin:  Warm & dry. Left chest port accessed.  CBC Recent Labs     06/23/15  0500  06/24/15  0450  07/04/15  0500  WBC  7.5  5.9  7.0  HGB  8.6*  9.1*  8.5*  HCT  27.2*  29.8*  28.3*  PLT  131*  122*  126*    Coag's No results for input(s): APTT, INR in the last 72 hours.  BMET Recent Labs     06/23/15  0500  06/24/15  0450  04-Jul-2015  0500  NA  143  144  143  K  3.9  3.8  4.2  CL  101  97*  98*  CO2  33*  40*  40*  BUN  23*  25*  25*  CREATININE  0.40*  0.58  0.59  GLUCOSE  171*  186*  177*    Electrolytes Recent Labs     06/23/15  0500  06/24/15  0450  07-02-15  0500  CALCIUM  8.3*  8.1*  8.4*  MG  2.2  2.3  2.4  PHOS  5.7*  3.3  2.6    Sepsis Markers No results for input(s): PROCALCITON, O2SATVEN in the last 72 hours.  Invalid input(s): LACTICACIDVEN  ABG No results for input(s): PHART, PCO2ART, PO2ART in the last 72 hours.  Liver Enzymes Recent Labs     06/23/15  0500  06/24/15  0450  07-02-2015  0500  ALBUMIN  2.3*  2.4*  2.4*    Cardiac Enzymes No results  for input(s): TROPONINI, PROBNP in the last 72 hours.  Glucose Recent Labs     06/24/15  1154  06/24/15  1628  06/24/15  2040  06/24/15  2359  07/02/15  0314  Jul 02, 2015  0754  GLUCAP  170*  147*  187*  170*  202*  165*    Imaging Dg Chest Port 1 View  06/24/2015   CLINICAL DATA:  Ventilator dependent respiratory failure. Current history of metastatic laryngeal carcinoma. Followup right pleural effusion and associated consolidation in the right lower lobe and airspace opacities throughout the left lung.  EXAM: PORTABLE CHEST - 1 VIEW  COMPARISON:  06/22/2015 and earlier, including CT chest 06/18/2015 and PET-CT 05/02/2015.  FINDINGS: Endotracheal tube tip in satisfactory position projecting 2-3 cm above the carina. Left jugular Port-A-Cath tip projects over the upper SVC, unchanged. Nasogastric tube courses below the diaphragm into the stomach. Right arm PICC tip projects over the mid to lower SVC.  Stable moderately large right pleural effusion and associated consolidation in the right lower lobe. Interval mild worsening of predominantly perihilar airspace opacities in the left lung.  IMPRESSION: 1. Support apparatus satisfactory. 2. Stable moderately large right pleural effusion and associated passive atelectasis and/or pneumonia in the right lower lobe. 3. Slight progression of predominately perihilar airspace opacities in the left lung, query asymmetric pulmonary edema or lymphangitic carcinomatosis.   Electronically Signed   By: Evangeline Dakin M.D.   On: 06/24/2015 11:45  no sig change.   ASSESSMENT / PLAN: PULMONARY OETT 8/3>> A: Acute hypoxic and Hypercarbic respiratory failure (room air sats 86% on admit) in the setting of diffuse ground glass pulmonary infiltrates L>R: in setting of chemo induced pneumonitis vs lymphangitic spread of her cancer. BAL 8/3-showed atypical cells  Right malignant pleural effusion: this has not changed significantly radiographically and is would not explain  the level of symptom burden she is experiencing.  >has not significantly improved clinically or really radiographically. Concerned that even with what improvements she has made doubt it would sustain unassisted breathing for long.  P: Continuing Solumedrol Plan for terminal extubation today at noon once family is ready  CARDIOVASCULAR  Port A:  Bradycardia - Related to sedation SIRS - Question sepsis.  P: Tele monitoring Plan to D/C vasopressor with extubation  Renal A: No acute issues  P: Foley catheter to remain in place for comfort  GASTROINTESTINAL  A: No acute issues.  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  Thrombocytopenia - worsening  P: Plan to D/C Lovenox after extubation Plan to discontinue labs after extubation for patient's comfort  ENDOCRINE A: Hyperglycemia  Solu-Medrol IV  P: SSI algothrim Accuchecks q4hr  Neuro: A:  Acute encephalopathy  Possible Delirium  P: Continuing Versed, Fentanyl, & Precedex drips Plan to decrease Fentanyl gtt & D/C Versed drip with extubation RASS: 0 to -1  INFECTIOUS DISEASE A: HCAP - See above pulm section. No organism identified after bronch.   P: Monitoring for fever  vanc 8/1>>> 8/5 Zosyn 8/1>>> azithro 8/3>>>8/7  RVP 8/2 (swab)>>>neg BAL 8/3>>>ng BCX2 8/1>>>ng UC 8/1>>>ng  TODAY'S SUMMARY: Husband and daughters updated at bedside today. Family desires terminal extubation around noon today to allow for family to gather. We did discuss that she will not likely survive because of the respiratory rate required for her minute ventilation. I also explained that we would remove any life-sustaining treatments such as vasopressors so we didn't prolong her suffering and they agreed. We will use nasal cannula oxygen. We are placing her tube feedings on hold. We will leave  the Precedex gtt on for patient's comfort as well as her Fentanyl gtt to relieve any dyspnea that she may experience. All present are in agreement.  I have spent a total of 42 minutes of critical care time today discussing the plan of care with family at bedside, caring for the patient, and reviewing the patient's EMR.  Sonia Baller Ashok Cordia, M.D. [facility] Pager:  [phone] After 3pm or if no response, call [phone]

## 2015-07-17 NOTE — Progress Notes (Signed)
Nutrition Brief Note  Chart reviewed. Pt now transitioning to comfort care.  No further nutrition interventions warranted at this time.  Please re-consult as needed.   Clayton Bibles, MS, RD, LDN Pager: [phone] After Hours Pager: [phone]

## 2015-07-17 NOTE — Progress Notes (Signed)
Chaplain was referred by nurse to visit patient. Chaplain provided a listening presence as well as support. Patient's husband spoke with chaplain concerning "being at peace" with the situation and circumstance. Chaplain will continue to follow.   07-23-15 1000  Clinical Encounter Type  Visited With Patient and family together  Visit Type Initial;Spiritual support;Social support  Referral From Nurse  Consult/Referral To Chaplain

## 2015-07-17 NOTE — Progress Notes (Signed)
Patient placed on PSV 15cm peep of 5cm after comfort medications given. Patient became tachypneic with rr 35-40. Comfort medications bolused. When patient rr decreased and much less agonal decreased PSV to 10cm. Patient then weaned to 5cm PSV. After final bolus of comfort meds patient extubated to room air. Family remains at bedside Stabilization process took approximately 20 min.

## 2015-07-17 NOTE — Progress Notes (Signed)
  Oncology Nurse Navigator Documentation   Navigator Encounter Type: Other (24-Jul-2015 1115)      Met briefly with patient's husband to provide support and comfort prior to his wife's removal from life support.  I recognized her bravery in the face of her prognosis, his unfailing support.  He expressed deep certainty of his and her faith, that her transition was to be one of healing.             Gayleen Orem, RN, BSN, [place] [facility] [place] [phone]

## 2015-07-17 NOTE — Progress Notes (Addendum)
Sherry Craig   DOB:03-Mar-1946   TZ#:[id number]   QPR#:[id number]  Patient Care Team: Lester Kinsman, PA-C as PCP - General (Physician Assistant) Thea Silversmith, MD (Radiation Oncology) Jerrell Belfast, MD (Otolaryngology) Heath Lark, MD as Consulting Physician (Hematology and Oncology) Leota Sauers, RN as Oncology Nurse Navigator (Oncology) Karie Mainland, RD as Dietitian (Nutrition)   Subjective: patient seen and examined. She was extubated and sedated. Other history cannot be obtained at this time due to current status.  Scheduled Meds: . antiseptic oral rinse  7 mL Mouth Rinse QID  . chlorhexidine  15 mL Mouth Rinse BID  . enoxaparin (LOVENOX) injection  40 mg Subcutaneous Q24H  . feeding supplement (PRO-STAT SUGAR FREE 64)  30 mL Per Tube q morning - 10a  . feeding supplement (VITAL AF 1.2 CAL)  1,000 mL Per Tube Q24H  . free water  200 mL Per Tube 3 times per day  . insulin aspart  0-9 Units Subcutaneous 6 times per day  . methylPREDNISolone (SOLU-MEDROL) injection  60 mg Intravenous 3 times per day  . multivitamin  5 mL Per Tube Daily  . pantoprazole sodium  40 mg Per Tube Q1200  . piperacillin-tazobactam (ZOSYN)  IV  3.375 g Intravenous Q8H  . pravastatin  40 mg Per Tube Daily  . sodium chloride  10-40 mL Intracatheter Q12H  . sodium chloride  10-40 mL Intracatheter Q12H  . vitamin A & D       Continuous Infusions: . sodium chloride 10 mL/hr at 06/24/15 1800  . sodium chloride Stopped (06/22/15 2200)  . dexmedetomidine 1.5 mcg/kg/hr (07-Jul-2015 0500)  . fentaNYL infusion INTRAVENOUS 400 mcg/hr (July 07, 2015 0500)  . midazolam (VERSED) infusion 5 mg/hr (July 07, 2015 0500)  . phenylephrine (NEO-SYNEPHRINE) Adult infusion 20 mcg/min (2015-07-07 0500)   PRN Meds:.acetaminophen **OR** acetaminophen, fentaNYL, haloperidol lactate, midazolam, sodium chloride, sodium chloride  Objective:  Filed Vitals:   2015/07/07 0445  BP: 135/66  Pulse:   Temp:   Resp:        Intake/Output Summary (Last 24 hours) at 07/07/15 0650 Last data filed at 07-07-15 0500  Gross per 24 hour  Intake 3304.36 ml  Output   2320 ml  Net 984.36 ml     GENERAL: Intubated and sedated. cachectic SKIN: skin color, texture, turgor are normal, no rashes or significant lesions EYES: Closed NECK: supple, thyroid normal size, non-tender, without nodularity LYMPH:  no palpable lymphadenopathy in the cervical, axillary or inguinal LUNGS: On ventilator. bibasilar rales, left greater than right, no rhonchi or wheezing. Decreased breath sounds on the right base. Uses accessory muscles HEART: bradycardic, regular rate & rhythm and no murmurs and no lower extremity edema ABDOMEN: soft, non-tender and decreased bowel sounds   CBG (last 3)   Recent Labs  06/24/15 2040 06/24/15 2359 07-07-2015 0314  GLUCAP 187* 170* 202*     Labs:   Recent Labs Lab 06/21/15 0600 06/22/15 0411 06/23/15 0500 06/24/15 0450 07-07-2015 0500  WBC 8.8 8.4 7.5 5.9 7.0  HGB 7.2* 8.0* 8.6* 9.1* 8.5*  HCT 23.9* 25.7* 27.2* 29.8* 28.3*  PLT 145* 147* 131* 122* 126*  MCV 90.2 90.5 91.6 91.7 91.9  MCH 27.2 28.2 29.0 28.0 27.6  MCHC 30.1 31.1 31.6 30.5 30.0  RDW 19.2* 19.5* 20.2* 21.3* 21.5*  LYMPHSABS  --  0.7 0.7 0.5* PENDING  MONOABS  --  0.3 0.2 0.2 PENDING  EOSABS  --  0.0 0.0 0.0 PENDING  BASOSABS  --  0.0 0.0 0.0 PENDING  Chemistries:    Recent Labs Lab 06/21/15 1530 06/22/15 0411 06/23/15 0500 06/24/15 0450 07/21/2015 0500  NA 140 143 143 144 143  K 4.5 4.4 3.9 3.8 4.2  CL 99* 102 101 97* 98*  CO2 34* 31 33* 40* 40*  GLUCOSE 134* 154* 171* 186* 177*  BUN 20 19 23* 25* 25*  CREATININE 0.59 0.57 0.40* 0.58 0.59  CALCIUM 8.4* 8.4* 8.3* 8.1* 8.4*  MG 2.2 2.4 2.2 2.3 2.4    GFR Estimated Creatinine Clearance: 52.2 mL/min (by C-G formula based on Cr of 0.59).  Liver Function Tests:  Recent Labs Lab 06/21/15 1530 06/22/15 0411 06/23/15 0500 06/24/15 0450  2015-07-21 0500  ALBUMIN 2.2* 2.4* 2.3* 2.4* 2.4*     Cardiac Enzymes:  Recent Labs Lab 06/20/15 1156  TROPONINI 0.15*    Microbiology Negative to date   Imaging Studies:   chest x ray on 8/9  IMPRESSION: 1. Support apparatus satisfactory. 2. Stable moderately large right pleural effusion and associated passive atelectasis and/or pneumonia in the right lower lobe. 3. Slight progression of predominately perihilar airspace opacities in the left lung, query asymmetric pulmonary edema or lymphangitic carcinomatosis.   Assessment/Plan: 69 y.o.  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  Grade 2 diastolic dysfunction Unfortunately she was not making good progress. Patient was extubated and transitioned to comfort care Will sign off. I offered my sympathy & support to the family   Rondel Jumbo, PA-C 07-21-2015  6:50 AM Glendel Jaggers, MD 21-Jul-2015

## 2015-07-17 NOTE — Discharge Summary (Signed)
Death Note: For complete accounting of the patient's history and physical exam on admission please refer to the patient's admission H&P. 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 Patient presented with dyspnea on 07/15/2015 with a CT angiogram of the chest showing no evidence of pulmonary embolus but with right lower lobe collapse and consolidation. Patient was empirically started on antibiotics for healthcare associated pneumonia. Patient's respiratory status progressively worsened and she failed an overnight trial of BiPAP requiring intubation on 06/18/15. Patient underwent bronchoscopy postintubation and lavage was performed with cultures. In addition to empiric anabiotic she was started on steroid therapy for possible acute/subacute pneumonitis from her chemotherapeutic regimen. Patient's respiratory status continued to remain marginal requiring a high minute ventilation. Escalating doses of sedation were required to maintain the patient's comfort on the ventilator. The patient intermittently did have hypotension which was thought to be secondary to the high levels of sedation required to achieve patient comfort. Diuresis was attempted with Lasix to optimize the patient's respiratory status but ultimately proved unsuccessful. Discussions were held with the patient's children as well as her husband regarding her failure to progress and ongoing vasopressor requirement. The decision was made for terminal extubation and comfort measures on Jul 21, 2015. After a terminal vent wean she was successfully extubated and remained on a fentanyl drip IV and Precedex for her comfort. The patient's degree of hypoxia continued to worsen and per nursing report she had escalating degree of suffering requiring further medication and ultimately a low dose continuous infusion of Versed. In reviewing her electronic medical record she was in asystole at 1600 hrs with no  spontaneous respirations having passed.  Diagnoses At Death: 1. Acute hypoxic respiratory failure 2. Systemic inflammatory response syndrome 3. Acute encephalopathy 4. Healthcare associated pneumonia 5. Metastatic head and neck cancer 6. Malignant pleural effusion

## 2015-07-17 DEATH — deceased

## 2015-07-31 LAB — AFB CULTURE WITH SMEAR (NOT AT ARMC): Acid Fast Smear: NONE SEEN

## 2017-06-20 IMAGING — US US THORACENTESIS ASP PLEURAL SPACE W/IMG GUIDE
1 series · 3 of 3 positions shown · non-contrast
Comparison: None.

MEDICATIONS:
None

COMPLICATIONS:
None immediate

INDICATION: Patient with history of squamous cell cancer of the hypopharynx,
dyspnea, right pleural effusion. Request is made for therapeutic
right thoracentesis.

EXAM:
ULTRASOUND GUIDED RIGHT THORACENTESIS
TECHNIQUE: Informed written consent was obtained from the patient after a
discussion of the risks, benefits and alternatives to treatment. A
timeout was performed prior to the initiation of the procedure.

[Series 1: us thoracentesis asp pleural space w/img guide · 0.25mm/px · 3 of 3 slices shown]
[im 1/3]
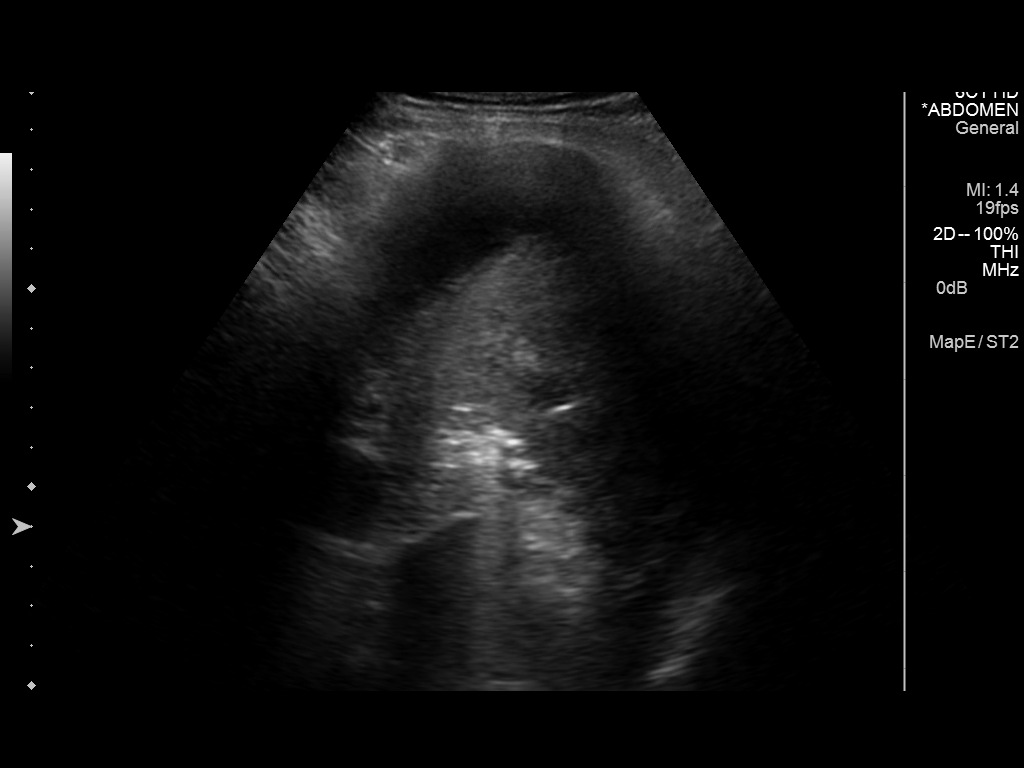
[im 2/3]
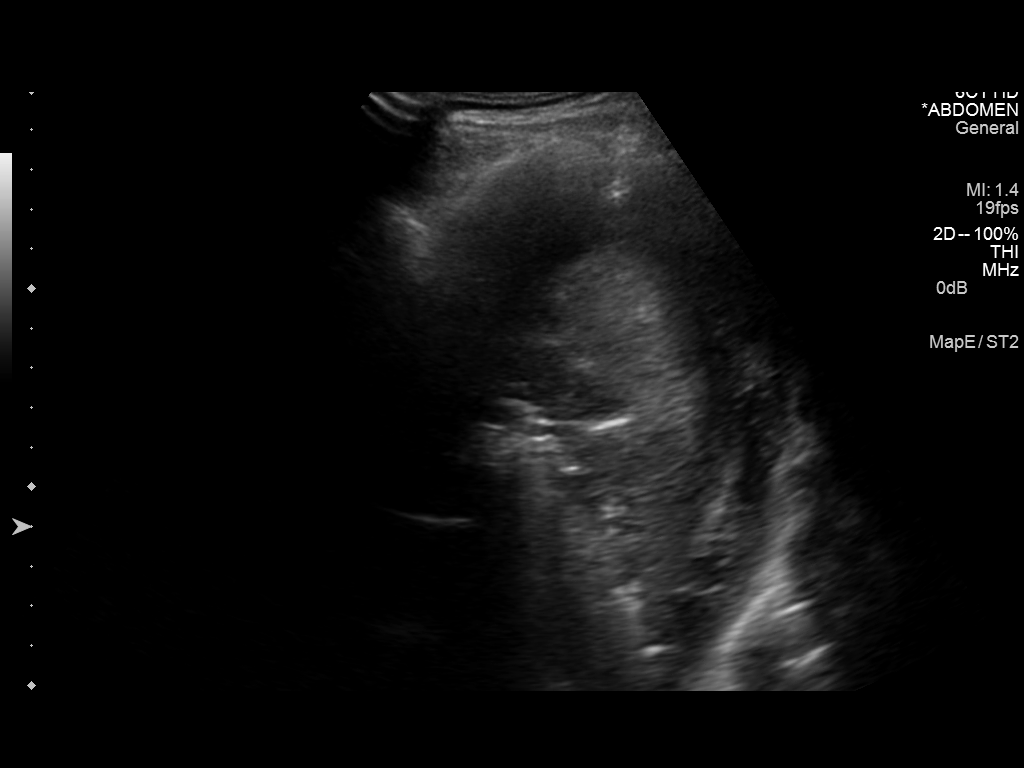
[im 3/3]
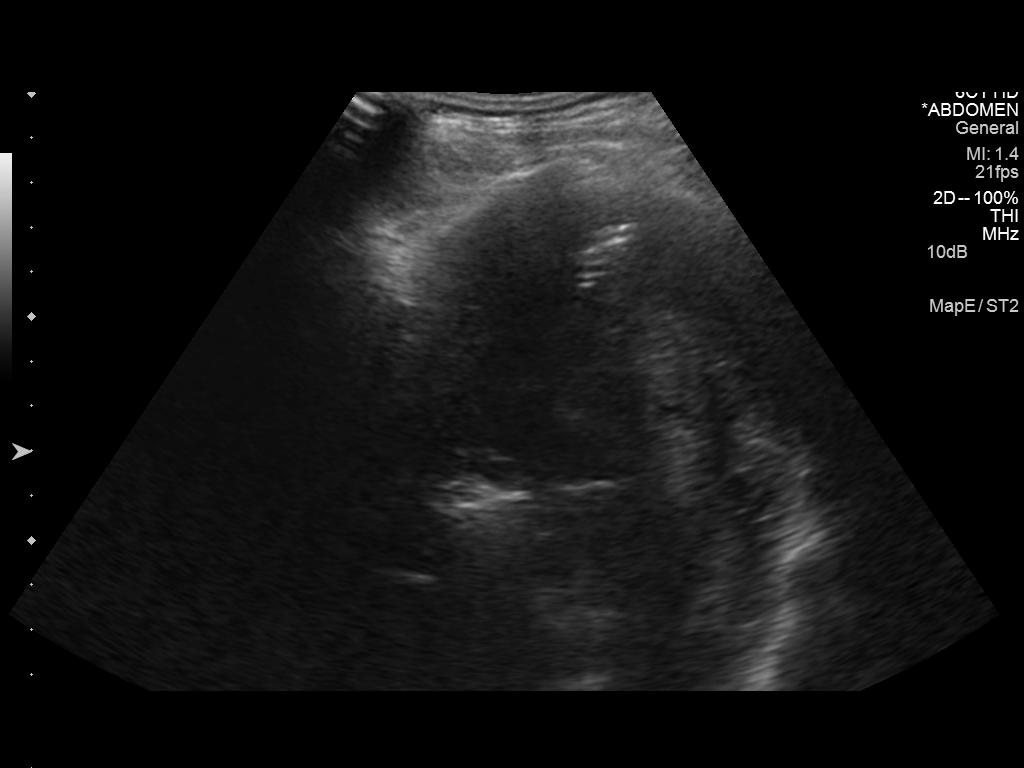

[3 of 3 positions shown; findings below may reference images not displayed]

Initial ultrasound scanning demonstrates a small, loculated right
pleural effusion. The lower chest was prepped and draped in the
usual sterile fashion. 1% lidocaine was used for local anesthesia.

Under direct ultrasound guidance, a 19 gauge, 7-cm, Yueh catheter
was introduced. An ultrasound image was saved for documentation
purposes. An 8 Fr Safe-T-Centesis catheter was also introduced due
to minimal return from the Yueh catheter. The thoracentesis was
performed. The catheter was removed and a dressing was applied. The
patient tolerated the procedure well without immediate post
procedural complication. The patient was escorted to have an upright
chest radiograph.
FINDINGS: Despite multiple attempts with two different catheter systems only a
few cc's of dark bloody fluid could be aspirated at this time from
right pleural space.
IMPRESSION: Ultrasound-guided right sided thoracentesis yielding only few cc's
of dark bloody fluid. By today's posterior chest ultrasound there is
a small loculated right pleural effusion. If patient's dyspnea
worsens over the next several days recommend follow-up chest x-ray
and/or posterior chest ultrasound to assess for increasing effusion.
